# Patient Record
Sex: Female | Born: 1965 | Race: Black or African American | Hispanic: No | Marital: Married | Smoking: Current every day smoker
Health system: Southern US, Community
[De-identification: ages and names within clinical notes are randomized; demographics above are authoritative.]

## PROBLEM LIST (undated history)

## (undated) DIAGNOSIS — I7100 Dissection of unspecified site of aorta: Secondary | ICD-10-CM

## (undated) DIAGNOSIS — I10 Essential (primary) hypertension: Secondary | ICD-10-CM

## (undated) DIAGNOSIS — E7800 Pure hypercholesterolemia, unspecified: Secondary | ICD-10-CM

## (undated) DIAGNOSIS — G5600 Carpal tunnel syndrome, unspecified upper limb: Secondary | ICD-10-CM

## (undated) HISTORY — PX: CARPAL TUNNEL RELEASE: SHX101

## (undated) HISTORY — PX: CHOLECYSTECTOMY: SHX55

---

## 1997-10-12 ENCOUNTER — Emergency Department (HOSPITAL_COMMUNITY): Admission: EM | Admit: 1997-10-12 | Discharge: 1997-10-12 | Payer: Self-pay | Admitting: Emergency Medicine

## 1998-01-04 ENCOUNTER — Emergency Department (HOSPITAL_COMMUNITY): Admission: EM | Admit: 1998-01-04 | Discharge: 1998-01-04 | Payer: Self-pay | Admitting: Family Medicine

## 1998-02-28 ENCOUNTER — Emergency Department (HOSPITAL_COMMUNITY): Admission: EM | Admit: 1998-02-28 | Discharge: 1998-02-28 | Payer: Self-pay | Admitting: Emergency Medicine

## 1998-04-08 ENCOUNTER — Observation Stay (HOSPITAL_COMMUNITY): Admission: EM | Admit: 1998-04-08 | Discharge: 1998-04-09 | Payer: Self-pay | Admitting: Emergency Medicine

## 1998-04-08 ENCOUNTER — Encounter: Payer: Self-pay | Admitting: Emergency Medicine

## 1998-07-10 ENCOUNTER — Emergency Department (HOSPITAL_COMMUNITY): Admission: EM | Admit: 1998-07-10 | Discharge: 1998-07-10 | Payer: Self-pay | Admitting: Emergency Medicine

## 1998-08-15 ENCOUNTER — Emergency Department (HOSPITAL_COMMUNITY): Admission: EM | Admit: 1998-08-15 | Discharge: 1998-08-15 | Payer: Self-pay | Admitting: Emergency Medicine

## 1998-08-20 ENCOUNTER — Emergency Department (HOSPITAL_COMMUNITY): Admission: EM | Admit: 1998-08-20 | Discharge: 1998-08-20 | Payer: Self-pay | Admitting: Emergency Medicine

## 1998-09-17 ENCOUNTER — Emergency Department (HOSPITAL_COMMUNITY): Admission: EM | Admit: 1998-09-17 | Discharge: 1998-09-17 | Payer: Self-pay | Admitting: Emergency Medicine

## 1998-11-03 ENCOUNTER — Encounter: Admission: RE | Admit: 1998-11-03 | Discharge: 1998-11-03 | Payer: Self-pay | Admitting: Internal Medicine

## 1998-11-11 ENCOUNTER — Encounter: Admission: RE | Admit: 1998-11-11 | Discharge: 1998-11-11 | Payer: Self-pay | Admitting: Obstetrics & Gynecology

## 1999-01-28 ENCOUNTER — Ambulatory Visit (HOSPITAL_COMMUNITY): Admission: RE | Admit: 1999-01-28 | Discharge: 1999-01-28 | Payer: Self-pay | Admitting: Obstetrics & Gynecology

## 1999-04-08 ENCOUNTER — Emergency Department (HOSPITAL_COMMUNITY): Admission: EM | Admit: 1999-04-08 | Discharge: 1999-04-08 | Payer: Self-pay | Admitting: *Deleted

## 1999-11-26 ENCOUNTER — Other Ambulatory Visit: Admission: RE | Admit: 1999-11-26 | Discharge: 1999-11-26 | Payer: Self-pay | Admitting: Obstetrics

## 2000-02-25 ENCOUNTER — Inpatient Hospital Stay (HOSPITAL_COMMUNITY): Admission: AD | Admit: 2000-02-25 | Discharge: 2000-02-25 | Payer: Self-pay | Admitting: *Deleted

## 2000-04-05 ENCOUNTER — Inpatient Hospital Stay (HOSPITAL_COMMUNITY): Admission: AD | Admit: 2000-04-05 | Discharge: 2000-04-05 | Payer: Self-pay | Admitting: Obstetrics

## 2000-04-13 ENCOUNTER — Inpatient Hospital Stay (HOSPITAL_COMMUNITY): Admission: AD | Admit: 2000-04-13 | Discharge: 2000-04-16 | Payer: Self-pay | Admitting: Obstetrics

## 2000-04-13 ENCOUNTER — Encounter (INDEPENDENT_AMBULATORY_CARE_PROVIDER_SITE_OTHER): Payer: Self-pay

## 2001-02-22 ENCOUNTER — Emergency Department (HOSPITAL_COMMUNITY): Admission: EM | Admit: 2001-02-22 | Discharge: 2001-02-22 | Payer: Self-pay | Admitting: *Deleted

## 2001-03-03 ENCOUNTER — Inpatient Hospital Stay (HOSPITAL_COMMUNITY): Admission: AD | Admit: 2001-03-03 | Discharge: 2001-03-03 | Payer: Self-pay | Admitting: *Deleted

## 2001-03-04 ENCOUNTER — Emergency Department (HOSPITAL_COMMUNITY): Admission: EM | Admit: 2001-03-04 | Discharge: 2001-03-04 | Payer: Self-pay | Admitting: *Deleted

## 2001-04-30 ENCOUNTER — Emergency Department (HOSPITAL_COMMUNITY): Admission: EM | Admit: 2001-04-30 | Discharge: 2001-04-30 | Payer: Self-pay | Admitting: Emergency Medicine

## 2001-06-27 ENCOUNTER — Encounter: Payer: Self-pay | Admitting: Emergency Medicine

## 2001-06-27 ENCOUNTER — Emergency Department (HOSPITAL_COMMUNITY): Admission: EM | Admit: 2001-06-27 | Discharge: 2001-06-27 | Payer: Self-pay | Admitting: Emergency Medicine

## 2001-07-18 ENCOUNTER — Emergency Department (HOSPITAL_COMMUNITY): Admission: EM | Admit: 2001-07-18 | Discharge: 2001-07-18 | Payer: Self-pay | Admitting: Emergency Medicine

## 2001-12-15 ENCOUNTER — Emergency Department (HOSPITAL_COMMUNITY): Admission: EM | Admit: 2001-12-15 | Discharge: 2001-12-15 | Payer: Self-pay | Admitting: Emergency Medicine

## 2002-01-03 ENCOUNTER — Emergency Department (HOSPITAL_COMMUNITY): Admission: EM | Admit: 2002-01-03 | Discharge: 2002-01-03 | Payer: Self-pay | Admitting: Emergency Medicine

## 2002-01-09 ENCOUNTER — Emergency Department (HOSPITAL_COMMUNITY): Admission: EM | Admit: 2002-01-09 | Discharge: 2002-01-09 | Payer: Self-pay | Admitting: Emergency Medicine

## 2002-01-12 ENCOUNTER — Emergency Department (HOSPITAL_COMMUNITY): Admission: EM | Admit: 2002-01-12 | Discharge: 2002-01-12 | Payer: Self-pay | Admitting: Emergency Medicine

## 2002-05-07 ENCOUNTER — Inpatient Hospital Stay (HOSPITAL_COMMUNITY): Admission: AD | Admit: 2002-05-07 | Discharge: 2002-05-07 | Payer: Self-pay | Admitting: *Deleted

## 2002-05-09 ENCOUNTER — Inpatient Hospital Stay (HOSPITAL_COMMUNITY): Admission: AD | Admit: 2002-05-09 | Discharge: 2002-05-09 | Payer: Self-pay | Admitting: Obstetrics and Gynecology

## 2002-07-15 ENCOUNTER — Emergency Department (HOSPITAL_COMMUNITY): Admission: EM | Admit: 2002-07-15 | Discharge: 2002-07-15 | Payer: Self-pay | Admitting: Emergency Medicine

## 2002-07-30 ENCOUNTER — Emergency Department (HOSPITAL_COMMUNITY): Admission: EM | Admit: 2002-07-30 | Discharge: 2002-07-30 | Payer: Self-pay | Admitting: Emergency Medicine

## 2002-09-18 ENCOUNTER — Emergency Department (HOSPITAL_COMMUNITY): Admission: EM | Admit: 2002-09-18 | Discharge: 2002-09-18 | Payer: Self-pay | Admitting: Emergency Medicine

## 2002-10-04 ENCOUNTER — Emergency Department (HOSPITAL_COMMUNITY): Admission: EM | Admit: 2002-10-04 | Discharge: 2002-10-04 | Payer: Self-pay | Admitting: Emergency Medicine

## 2003-01-25 ENCOUNTER — Emergency Department (HOSPITAL_COMMUNITY): Admission: EM | Admit: 2003-01-25 | Discharge: 2003-01-25 | Payer: Self-pay | Admitting: Emergency Medicine

## 2003-03-20 ENCOUNTER — Emergency Department (HOSPITAL_COMMUNITY): Admission: EM | Admit: 2003-03-20 | Discharge: 2003-03-20 | Payer: Self-pay | Admitting: Emergency Medicine

## 2003-05-03 ENCOUNTER — Emergency Department (HOSPITAL_COMMUNITY): Admission: EM | Admit: 2003-05-03 | Discharge: 2003-05-03 | Payer: Self-pay | Admitting: Emergency Medicine

## 2003-05-22 ENCOUNTER — Ambulatory Visit (HOSPITAL_COMMUNITY): Admission: RE | Admit: 2003-05-22 | Discharge: 2003-05-22 | Payer: Self-pay | Admitting: *Deleted

## 2003-07-28 ENCOUNTER — Emergency Department (HOSPITAL_COMMUNITY): Admission: EM | Admit: 2003-07-28 | Discharge: 2003-07-28 | Payer: Self-pay | Admitting: Emergency Medicine

## 2003-08-19 ENCOUNTER — Inpatient Hospital Stay (HOSPITAL_COMMUNITY): Admission: AD | Admit: 2003-08-19 | Discharge: 2003-08-19 | Payer: Self-pay | Admitting: *Deleted

## 2003-09-22 ENCOUNTER — Inpatient Hospital Stay (HOSPITAL_COMMUNITY): Admission: AD | Admit: 2003-09-22 | Discharge: 2003-09-25 | Payer: Self-pay | Admitting: Family Medicine

## 2003-09-22 ENCOUNTER — Encounter (INDEPENDENT_AMBULATORY_CARE_PROVIDER_SITE_OTHER): Payer: Self-pay | Admitting: Specialist

## 2003-09-30 ENCOUNTER — Inpatient Hospital Stay (HOSPITAL_COMMUNITY): Admission: AD | Admit: 2003-09-30 | Discharge: 2003-09-30 | Payer: Self-pay | Admitting: Obstetrics and Gynecology

## 2004-03-14 ENCOUNTER — Emergency Department (HOSPITAL_COMMUNITY): Admission: EM | Admit: 2004-03-14 | Discharge: 2004-03-14 | Payer: Self-pay | Admitting: Emergency Medicine

## 2004-03-27 ENCOUNTER — Ambulatory Visit (HOSPITAL_COMMUNITY): Payer: Self-pay | Admitting: Psychiatry

## 2004-04-16 ENCOUNTER — Emergency Department (HOSPITAL_COMMUNITY): Admission: EM | Admit: 2004-04-16 | Discharge: 2004-04-16 | Payer: Self-pay | Admitting: Emergency Medicine

## 2004-05-01 ENCOUNTER — Emergency Department (HOSPITAL_COMMUNITY): Admission: EM | Admit: 2004-05-01 | Discharge: 2004-05-01 | Payer: Self-pay | Admitting: Emergency Medicine

## 2004-05-29 ENCOUNTER — Emergency Department (HOSPITAL_COMMUNITY): Admission: EM | Admit: 2004-05-29 | Discharge: 2004-05-29 | Payer: Self-pay | Admitting: Emergency Medicine

## 2004-06-22 ENCOUNTER — Emergency Department (HOSPITAL_COMMUNITY): Admission: EM | Admit: 2004-06-22 | Discharge: 2004-06-22 | Payer: Self-pay | Admitting: Emergency Medicine

## 2004-07-02 ENCOUNTER — Emergency Department (HOSPITAL_COMMUNITY): Admission: EM | Admit: 2004-07-02 | Discharge: 2004-07-02 | Payer: Self-pay | Admitting: Emergency Medicine

## 2004-08-17 ENCOUNTER — Emergency Department (HOSPITAL_COMMUNITY): Admission: EM | Admit: 2004-08-17 | Discharge: 2004-08-17 | Payer: Self-pay | Admitting: Emergency Medicine

## 2006-01-26 ENCOUNTER — Emergency Department (HOSPITAL_COMMUNITY): Admission: EM | Admit: 2006-01-26 | Discharge: 2006-01-26 | Payer: Self-pay | Admitting: Emergency Medicine

## 2006-04-07 ENCOUNTER — Emergency Department (HOSPITAL_COMMUNITY): Admission: EM | Admit: 2006-04-07 | Discharge: 2006-04-07 | Payer: Self-pay | Admitting: Emergency Medicine

## 2006-04-11 ENCOUNTER — Emergency Department (HOSPITAL_COMMUNITY): Admission: EM | Admit: 2006-04-11 | Discharge: 2006-04-11 | Payer: Self-pay | Admitting: Emergency Medicine

## 2006-06-16 ENCOUNTER — Emergency Department (HOSPITAL_COMMUNITY): Admission: EM | Admit: 2006-06-16 | Discharge: 2006-06-16 | Payer: Self-pay | Admitting: Internal Medicine

## 2006-08-08 ENCOUNTER — Emergency Department (HOSPITAL_COMMUNITY): Admission: EM | Admit: 2006-08-08 | Discharge: 2006-08-08 | Payer: Self-pay | Admitting: Emergency Medicine

## 2006-11-30 ENCOUNTER — Encounter: Admission: RE | Admit: 2006-11-30 | Discharge: 2006-11-30 | Payer: Self-pay | Admitting: Orthopedic Surgery

## 2007-04-28 ENCOUNTER — Emergency Department (HOSPITAL_COMMUNITY): Admission: EM | Admit: 2007-04-28 | Discharge: 2007-04-28 | Payer: Self-pay | Admitting: Emergency Medicine

## 2007-06-02 ENCOUNTER — Emergency Department (HOSPITAL_COMMUNITY): Admission: EM | Admit: 2007-06-02 | Discharge: 2007-06-02 | Payer: Self-pay | Admitting: Emergency Medicine

## 2007-06-12 ENCOUNTER — Emergency Department (HOSPITAL_COMMUNITY): Admission: EM | Admit: 2007-06-12 | Discharge: 2007-06-12 | Payer: Self-pay | Admitting: Emergency Medicine

## 2007-07-05 ENCOUNTER — Emergency Department (HOSPITAL_COMMUNITY): Admission: EM | Admit: 2007-07-05 | Discharge: 2007-07-05 | Payer: Self-pay | Admitting: Emergency Medicine

## 2007-07-10 ENCOUNTER — Emergency Department (HOSPITAL_COMMUNITY): Admission: EM | Admit: 2007-07-10 | Discharge: 2007-07-10 | Payer: Self-pay | Admitting: Emergency Medicine

## 2007-07-12 ENCOUNTER — Emergency Department (HOSPITAL_COMMUNITY): Admission: EM | Admit: 2007-07-12 | Discharge: 2007-07-12 | Payer: Self-pay | Admitting: Emergency Medicine

## 2007-07-20 ENCOUNTER — Emergency Department (HOSPITAL_COMMUNITY): Admission: EM | Admit: 2007-07-20 | Discharge: 2007-07-20 | Payer: Self-pay | Admitting: Emergency Medicine

## 2007-07-25 ENCOUNTER — Emergency Department (HOSPITAL_COMMUNITY): Admission: EM | Admit: 2007-07-25 | Discharge: 2007-07-25 | Payer: Self-pay | Admitting: Emergency Medicine

## 2007-08-04 ENCOUNTER — Emergency Department (HOSPITAL_COMMUNITY): Admission: EM | Admit: 2007-08-04 | Discharge: 2007-08-04 | Payer: Self-pay | Admitting: Emergency Medicine

## 2007-09-07 ENCOUNTER — Emergency Department (HOSPITAL_COMMUNITY): Admission: EM | Admit: 2007-09-07 | Discharge: 2007-09-07 | Payer: Self-pay | Admitting: Family Medicine

## 2007-09-21 ENCOUNTER — Emergency Department (HOSPITAL_COMMUNITY): Admission: EM | Admit: 2007-09-21 | Discharge: 2007-09-21 | Payer: Self-pay | Admitting: Emergency Medicine

## 2007-10-01 ENCOUNTER — Emergency Department: Payer: Self-pay | Admitting: Emergency Medicine

## 2007-11-04 ENCOUNTER — Emergency Department (HOSPITAL_COMMUNITY): Admission: EM | Admit: 2007-11-04 | Discharge: 2007-11-04 | Payer: Self-pay | Admitting: Emergency Medicine

## 2007-11-27 ENCOUNTER — Emergency Department (HOSPITAL_COMMUNITY): Admission: EM | Admit: 2007-11-27 | Discharge: 2007-11-27 | Payer: Self-pay | Admitting: Emergency Medicine

## 2007-12-03 ENCOUNTER — Emergency Department: Payer: Self-pay | Admitting: Emergency Medicine

## 2008-04-01 ENCOUNTER — Emergency Department (HOSPITAL_COMMUNITY): Admission: EM | Admit: 2008-04-01 | Discharge: 2008-04-01 | Payer: Self-pay | Admitting: Emergency Medicine

## 2008-04-20 ENCOUNTER — Emergency Department (HOSPITAL_COMMUNITY): Admission: EM | Admit: 2008-04-20 | Discharge: 2008-04-20 | Payer: Self-pay | Admitting: Emergency Medicine

## 2008-06-07 ENCOUNTER — Encounter: Admission: RE | Admit: 2008-06-07 | Discharge: 2008-06-07 | Payer: Self-pay | Admitting: Obstetrics and Gynecology

## 2008-11-02 ENCOUNTER — Emergency Department (HOSPITAL_COMMUNITY): Admission: EM | Admit: 2008-11-02 | Discharge: 2008-11-02 | Payer: Self-pay | Admitting: Emergency Medicine

## 2009-01-20 ENCOUNTER — Emergency Department (HOSPITAL_COMMUNITY): Admission: EM | Admit: 2009-01-20 | Discharge: 2009-01-20 | Payer: Self-pay | Admitting: Emergency Medicine

## 2010-05-16 ENCOUNTER — Encounter: Payer: Self-pay | Admitting: Obstetrics

## 2010-05-16 ENCOUNTER — Encounter: Payer: Self-pay | Admitting: Cardiology

## 2010-05-17 ENCOUNTER — Encounter: Payer: Self-pay | Admitting: Sports Medicine

## 2010-05-17 ENCOUNTER — Encounter: Payer: Self-pay | Admitting: Orthopedic Surgery

## 2010-05-18 ENCOUNTER — Encounter: Payer: Self-pay | Admitting: Family Medicine

## 2010-09-11 NOTE — Op Note (Signed)
[name], [name]                          ACCOUNT NO.:  [id number]   MEDICAL RECORD NO.:  [id number]                   PATIENT TYPE:  INP   LOCATION:  9119                                 FACILITY:  [facility]   PHYSICIAN:  Tanya S. Shawnie Pons, M.D.                DATE OF BIRTH:  November 16, 1965   DATE OF PROCEDURE:  09/22/2003  DATE OF DISCHARGE:                                 OPERATIVE REPORT   PREOPERATIVE DIAGNOSES:  1. Intrauterine pregnancy at 36-4/7 weeks, spontaneous rupture of membranes,     meconium-stained fluid.  2. Previous cesarean section x2.  3. Undesired fertility.   POSTOPERATIVE DIAGNOSES:  1. Intrauterine pregnancy at 36-4/7 weeks, spontaneous rupture of membranes,     meconium-stained fluid.  2. Previous cesarean section x2.  3. Undesired fertility.   PROCEDURE:  Repeat low transverse cesarean section and bilateral tubal  ligation with Filshie clips.   SURGEON:  Shelbie Proctor. Shawnie Pons, M.D.   ASSISTANT:  Jonah Blue, M.D.   ANESTHESIA:  Spinal.   ANESTHESIOLOGIST:  Belva Agee, M.D.   FINDINGS:  Meconium-stained fluid; viable female infant, Apgars 9/9, cord pH  7.33.   ESTIMATED BLOOD LOSS:  1000 mL.   COMPLICATIONS:  None.   SPECIMENS:  Placenta sent to pathology.   REASON FOR PROCEDURE:  The patient was a 45 year old gravida 5, para 2-0-2-  2, who has had a history of C-section x2, 1 for previa and 1 for placental  abruption, as well as an exploratory laparotomy for an ectopic pregnancy.  She was scheduled for a C-section later in June but ruptured membranes and  began contracting tonight.  She noted that the fluid was green-tinged in  appearance.  On presentation to the MAU, she was found to be ruptured, to 1-  2 cm, 60% and -3.  She was contracting every 2-3 and very uncomfortable.  The patient also desired permanent sterility.  She was counseled regarding  risks and benefits of this procedure, as well as the repeat C-section, and  plan was made to  proceed to the OR.   PROCEDURE:  The patient was taken to the OR, where spinal anesthesia was  placed.  When anesthesia was adequate, she was prepped and draped in the  usual sterile fashion.  A Pfannenstiel incision was made through an old  incision and carried down to the underlying fascia.  The fascia was divided  sharply in the midline and carried out laterally.  The fascia was then  dissected off the superior and inferior edges of the rectus using the  electrocautery.  The peritoneal cavity was entered on both sides and the 2  incisions connected.  The peritoneal incision was extended by the surgeon  __________ on either side of the incision.  The bladder blade was then  placed inside the abdomen and Metzenbaums and a pickup were used to create a  bladder flap and a knife  was used to make an incision in the lower uterine  segment.  The lower uterine segment was found to be somewhat thick and 2  Allis clamps were used in the midline to elevate the uterus until the  amniotic cavity was entered.  Again, meconium-stained fluid was noted.  The  infant was in a vertex presentation.  The uterine incision was extended  laterally with bandage scissors and the infant delivered easily.  The infant  was DeLee-suctioned on the abdomen, cord clamped x2, cut and taken to  awaiting pediatricians.  The placenta was removed manually and the uterine  cavity cleaned with dry lap pads.  The edges of the uterine incision were  then grasped with ring forceps and the uterine incision closed with an  interlocking 0 Vicryl suture.  Pressure was placed on the incision by a dry  lap pad while the left tube was found, grasped with a Babcock and a Filshie  clip placed across it.  Similarly, the right tube was found, followed to its  fimbriated end and a Filshie clip placed across it.  The tubes were returned  to the abdominal cavity; the uterine incision appeared to be hemostatic.  All laps were removed from the  abdomen and instruments as well.  The fascia  was closed with a 0 interrupted suture in a running fashion.  Subcutaneous  tissue was copiously irrigated and any bleeders cauterized and skin closed  using clips.  The skin was then infiltrated with 10 mL of 0.25% Marcaine.  All instrument, needle and lap counts were correct x2.  The patient was  awake, alert and taken to recovery room in stable condition.                                               Shelbie Proctor. Shawnie Pons, M.D.    TSP/MEDQ  D:  09/22/2003  T:  09/23/2003  Job:  [id number]

## 2010-09-11 NOTE — H&P (Signed)
[facility]  Patient:    [name], [name]                       MRN: [id number] Adm. Date:  [id number] Attending:  Venita Sheffield                         History and Physical  HISTORY OF PRESENT ILLNESS:   The patient is a 45 year old gravida 3 para 1-0-1-1 who had a previous C section.  EDC was May 06, 2000.  Patient states that her membranes ruptured one-half hour prior to admission and she started contracting and having heavy vaginal bleeding.  On the monitor she had a uterus which was moderately firm and there was some irritability.  She had positive GBS and there was a large amount of blood on her legs, floor, and in the vagina.  PHYSICAL EXAMINATION:  HEENT:                        Negative.  LUNGS:                        Clear.  HEART:                        Regular rhythm, no murmurs or gallops.  ABDOMEN:                      Term-sized uterus.  BREASTS:                      Negative.  PELVIC:                       Cervix is 2 cm, posterior, and the presenting part is not palpated.  EXTREMITIES:                  Negative.  IMPRESSION:                   Abruption of the placenta.  PLAN:                         Repeat low transverse cesarean section. DD:  04/13/00 TD:  04/13/00 Job: [id number]

## 2010-09-11 NOTE — Discharge Summary (Signed)
[name], [name]                          ACCOUNT NO.:  [id number]   MEDICAL RECORD NO.:  [id number]                   PATIENT TYPE:  INP   LOCATION:  9119                                 FACILITY:  [facility]   PHYSICIAN:  Tanya S. Shawnie Pons, M.D.                DATE OF BIRTH:  15-Nov-1965   DATE OF ADMISSION:  09/22/2003  DATE OF DISCHARGE:  09/25/2003                                 DISCHARGE SUMMARY   HOSPITAL COURSE:  This is a 45 year old G5 P3-0-2-3 who presented to the MAU  on Sep 22, 2003 who was scheduled for a repeat low transverse cesarean  section after spontaneous rupture of membranes.  She was also noted to have  moderate meconium after rupture.  She also requested a bilateral tubal  ligation after the cesarean section.  The patient was taken for cesarean  section on Sep 22, 2003.  She delivered a viable female, Apgars 9 at one and  9 at five minutes, no complications.  Postpartum course was uneventful.  She  was afebrile.  Vital signs were stable upon discharge.   ACTIVITY:  The patient is to have no heavy lifting for 2 weeks.   DIET:  Routine.   MEDICATIONS:  1. Prenatal vitamins one p.o. daily.  2. Ibuprofen 600 mg p.o. q.6h. p.r.n. pain.  3. Percocet 5/325 mg one p.o. q.4h. p.r.n. pain.   STATUS:  Well.   INSTRUCTIONS:  1. Routine.  2. Discharge to home.  3. Follow up in [facility] in 6 weeks.     Miles Costain, M.D.                          Shelbie Proctor. Shawnie Pons, M.D.    DY/MEDQ  D:  10/14/2003  T:  10/14/2003  Job:  [id number]

## 2010-09-11 NOTE — Discharge Summary (Signed)
[facility]  Patient:    NARCISSUS, DETWILER                       MRN: [id number] Adm. Date:  [id number] Disc. Date: [id number] Attending:  Venita Sheffield                           Discharge Summary  HISTORY:                      Patient is a 45 year old, gravida 3, para 1-0-1-1, [facility] May 06, 2000. She had a positive GBS and stated that her membranes ruptured half an hour prior to admission and then she started having heavy vaginal bleeding. She had a previous C-section. On admission, it was noted that her uterus was irritable, firm, cervix 1 cm, long, vertex, -3 with a large amount of blood in the vagina. Under the diagnosis of possible abruption, she was taken to the operating room, had a repeat low transverse cesarean section. She had anterior low-lying placenta with abruption. She delivered a 5 pound 3 ounce female, Apgar 9/9; cord pH 7.31.  Postoperatively she did well. She was discharged home on the third postoperative day, ambulatory, on a regular diet, and on Vicodin for pain. She is to see me in six weeks.  DISCHARGE DIAGNOSIS:          Status post repeat low transverse cesarean                               section for abruption of placenta.DD:  05/12/00 TD:  05/13/00 Job: [id number]

## 2010-09-11 NOTE — Op Note (Signed)
[facility]  Patient:    [name], [name]                       MRN: [id number] Proc. Date: 04/13/00 Adm. Date:  [id number] Attending:  Venita Sheffield                           Operative Report  PREOPERATIVE DIAGNOSES:       1. Intrauterine pregnancy at 37 weeks.                               2. Suspected abruption of placenta.  POSTOPERATIVE DIAGNOSES:      1. Intrauterine pregnancy at 37 weeks.                               2. Suspected abruption of placenta.  SURGEON:                      Kathreen Cosier, M.D.  ANESTHESIA:                   Spinal.  DESCRIPTION OF PROCEDURE:     The patient was placed on the operating room table in the supine position and after spinal anesthesia was administered the abdomen was prepped and draped.  The bladder was emptied with a Foley catheter.  A transverse suprapubic incision was made through the old scar and carried down to the rectus fascia.  The fascia was cleaned and incised the length of the incision and the rectus muscles were retracted laterally.  The peritoneum was incised longitudinally.  A transverse incision was made in the visceral peritoneum above the bladder and the bladder mobilized inferiorly.  A transverse lower uterine incision was made and it was noted she had an anterior low-lying placenta, with some abruption.  The amniotic fluid was meconium stained.  The infant was female and was assigned Apgar scores of 9 at one minute and 9 at five minutes, weighing 5 pounds 3 ounces.  The placenta was removed manually and the uterine cavity cleaned with dry laps.  The uterine incision was closed with interlocking suture of #1 chromic including the myometrium and endometrium.  The uterus was closed in two layers. Hemostasis was satisfactory.  The bladder flap was reattached with 2-0 chromic.  Sponge, needle, and instrument counts were correct.  The uterus was well contracted.  The abdomen  was closed in layers.  The peritoneum was closed with continuous suture of 0 chromic, the fascia with continuous suture of 0 Dexon, and the skin closed with subcuticular stitch of 3-0 Monocryl. Estimated blood loss was 600 cc. DD:  04/13/00 TD:  04/14/00 Job: [id number]

## 2011-01-14 LAB — DIFFERENTIAL
Basophils Absolute: 0
Lymphocytes Relative: 45
Lymphs Abs: 2.1
Monocytes Absolute: 0.2
Monocytes Relative: 5
Neutrophils Relative %: 48

## 2011-01-14 LAB — GC/CHLAMYDIA PROBE AMP, GENITAL
Chlamydia, DNA Probe: NEGATIVE
GC Probe Amp, Genital: NEGATIVE

## 2011-01-14 LAB — CBC
HCT: 32.4 — ABNORMAL LOW
Hemoglobin: 10.9 — ABNORMAL LOW
MCHC: 33.5
Platelets: 299
RDW: 13.1
WBC: 4.6

## 2011-01-14 LAB — BASIC METABOLIC PANEL
BUN: 4 — ABNORMAL LOW
CO2: 28
Calcium: 9.1
Chloride: 105
GFR calc non Af Amer: >60
Glucose, Bld: 93
Potassium: 3.9

## 2011-01-14 LAB — URINALYSIS, ROUTINE W REFLEX MICROSCOPIC

## 2011-09-22 ENCOUNTER — Other Ambulatory Visit: Payer: Self-pay | Admitting: Obstetrics and Gynecology

## 2011-09-22 DIAGNOSIS — Z1231 Encounter for screening mammogram for malignant neoplasm of breast: Secondary | ICD-10-CM

## 2011-10-11 ENCOUNTER — Ambulatory Visit
Admission: RE | Admit: 2011-10-11 | Discharge: 2011-10-11 | Disposition: A | Discharge status: Home / Self Care | Payer: Medicaid Other | Source: Ambulatory Visit | Attending: Obstetrics and Gynecology | Admitting: Obstetrics and Gynecology

## 2011-10-11 DIAGNOSIS — Z1231 Encounter for screening mammogram for malignant neoplasm of breast: Secondary | ICD-10-CM

## 2012-05-19 ENCOUNTER — Encounter (HOSPITAL_COMMUNITY): Payer: Self-pay | Admitting: Emergency Medicine

## 2012-05-19 ENCOUNTER — Emergency Department (HOSPITAL_COMMUNITY)
Admission: EM | Admit: 2012-05-19 | Discharge: 2012-05-19 | Disposition: A | Discharge status: Home / Self Care | Payer: Medicaid Other | Attending: Emergency Medicine | Admitting: Emergency Medicine

## 2012-05-19 DIAGNOSIS — K0889 Other specified disorders of teeth and supporting structures: Secondary | ICD-10-CM

## 2012-05-19 DIAGNOSIS — K089 Disorder of teeth and supporting structures, unspecified: Secondary | ICD-10-CM | POA: Insufficient documentation

## 2012-05-19 DIAGNOSIS — F17200 Nicotine dependence, unspecified, uncomplicated: Secondary | ICD-10-CM | POA: Insufficient documentation

## 2012-05-19 MED ORDER — OXYCODONE-ACETAMINOPHEN 5-325 MG PO TABS: 1.0000 | ORAL_TABLET | Freq: Four times a day (QID) | ORAL | Status: DC | PRN

## 2012-05-19 NOTE — ED Provider Notes (Signed)
History     CSN: [id number]  Arrival date & time 05/19/12  6295   First MD Initiated Contact with Patient 05/19/12 [phone]      Chief Complaint  Patient presents with  . Dental Pain    (Consider location/radiation/quality/duration/timing/severity/associated sxs/prior treatment) HPI Comments:  47 year old female presents emergency department complaining of right-sided dental pain beginning 2 days ago while flossing her teeth. Patient states she was flossing one half of her bottom molar "fluid across the room". Since then she has had increasing pain described as throbbing, nonradiating rated 8/10. Cold air and chewing makes the pain worse. Nothing in specific makes the pain better. Denies fever, chills or difficulty swallowing. She tried calling her dentist yesterday and states he would not see her do to her owing money.  Patient is a 47 y.o. female presenting with tooth pain. The history is provided by the patient.  Dental PainPrimary symptoms do not include fever.  Additional symptoms do not include: trouble swallowing.    History reviewed. No pertinent past medical history.  Past Surgical History  Procedure Date  . Cesarean section   . Cholecystectomy     History reviewed. No pertinent family history.  History  Substance Use Topics  . Smoking status: Current Every Day Smoker  . Smokeless tobacco: Not on file  . Alcohol Use: Yes    OB History    Grav Para Term Preterm Abortions TAB SAB Ect Mult Living                  Review of Systems  Constitutional: Negative for fever and chills.  HENT: Positive for dental problem. Negative for trouble swallowing, neck pain and neck stiffness.   Gastrointestinal: Negative for nausea.  Skin: Negative.     Allergies  Sulfa antibiotics  Home Medications   Current Outpatient Rx  Name  Route  Sig  Dispense  Refill  . ACETAMINOPHEN 500 MG PO TABS   Oral   Take 1,000 mg by mouth every 6 (six) hours as needed. For pain.         . ADULT MULTIVITAMIN W/MINERALS CH   Oral   Take 1 tablet by mouth daily.         . OXYCODONE-ACETAMINOPHEN 5-325 MG PO TABS   Oral   Take 1-2 tablets by mouth every 6 (six) hours as needed for pain.   10 tablet   0     BP 138/90  Pulse 106  Temp 97.9 F (36.6 C) (Oral)  Resp 20  SpO2 99%  LMP 04/30/2012  Physical Exam  Nursing note and vitals reviewed. Constitutional: She is oriented to person, place, and time. She appears well-developed and well-nourished. No distress.  HENT:  Head: Normocephalic and atraumatic.  Mouth/Throat: Abnormal dentition.    Eyes: Conjunctivae normal and EOM are normal.  Neck: Normal range of motion. Neck supple.  Cardiovascular: Normal rate, regular rhythm and normal heart sounds.   Pulmonary/Chest: Effort normal and breath sounds normal.  Musculoskeletal: Normal range of motion. She exhibits no edema.  Lymphadenopathy:       Head (right side): No submandibular adenopathy present.       Head (left side): No submandibular adenopathy present.  Neurological: She is alert and oriented to person, place, and time.  Skin: Skin is warm and dry.  Psychiatric: She has a normal mood and affect. Her behavior is normal.    ED Course  Procedures (including critical care time)  Labs Reviewed - No data to display  No results found.   1. Pain, dental       MDM  Dental pain without dental infection. Patient refused dental block. Discussed importance of f/u with dentist. Rx percocet #10. Patient states understanding of plan and is agreeable.         Trevor Mace, PA-C 05/19/12 [phone]

## 2012-05-19 NOTE — ED Provider Notes (Signed)
Medical screening examination/treatment/procedure(s) were performed by non-physician practitioner and as supervising physician I was immediately available for consultation/collaboration.  Donnetta Hutching, MD 05/19/12 1027

## 2012-05-19 NOTE — ED Notes (Signed)
Pt states she was flossing her teeth 2 days ago and part of the last molar on the right lower side broke off.  Pt states she has had pain since and doesn't have the money to go to the dentist.

## 2012-07-07 ENCOUNTER — Emergency Department (HOSPITAL_COMMUNITY)
Admission: EM | Admit: 2012-07-07 | Discharge: 2012-07-07 | Disposition: A | Discharge status: Home / Self Care | Payer: Self-pay | Attending: Emergency Medicine | Admitting: Emergency Medicine

## 2012-07-07 ENCOUNTER — Encounter (HOSPITAL_COMMUNITY): Payer: Self-pay | Admitting: *Deleted

## 2012-07-07 DIAGNOSIS — K1370 Unspecified lesions of oral mucosa: Secondary | ICD-10-CM | POA: Insufficient documentation

## 2012-07-07 DIAGNOSIS — F17200 Nicotine dependence, unspecified, uncomplicated: Secondary | ICD-10-CM | POA: Insufficient documentation

## 2012-07-07 DIAGNOSIS — K062 Gingival and edentulous alveolar ridge lesions associated with trauma: Secondary | ICD-10-CM

## 2012-07-07 MED ORDER — HYDROCODONE-ACETAMINOPHEN 5-325 MG PO TABS: 1.0000 | ORAL_TABLET | Freq: Four times a day (QID) | ORAL | Status: DC | PRN

## 2012-07-07 MED ORDER — PENICILLIN V POTASSIUM 500 MG PO TABS: 500.0000 mg | ORAL_TABLET | Freq: Three times a day (TID) | ORAL | Status: DC

## 2012-07-07 MED ORDER — FLUCONAZOLE 200 MG PO TABS: ORAL_TABLET | ORAL | Status: DC

## 2012-07-07 NOTE — ED Provider Notes (Signed)
History     CSN: [id number]  Arrival date & time 07/07/12  1845   First MD Initiated Contact with Patient 07/07/12 2128      Chief Complaint  Patient presents with  . Dental Pain    (Consider location/radiation/quality/duration/timing/severity/associated sxs/prior treatment) Patient is a 47 y.o. female presenting with tooth pain. The history is provided by the patient. No language interpreter was used.  Dental PainThe primary symptoms include mouth pain. The symptoms began more than 1 week ago. The symptoms are worsening. The symptoms occur constantly.    History reviewed. No pertinent past medical history.  Past Surgical History  Procedure Laterality Date  . Cesarean section    . Cholecystectomy      History reviewed. No pertinent family history.  History  Substance Use Topics  . Smoking status: Current Every Day Smoker  . Smokeless tobacco: Not on file  . Alcohol Use: Yes    OB History   Grav Para Term Preterm Abortions TAB SAB Ect Mult Living                  Review of Systems  HENT: Positive for mouth sores and dental problem.   All other systems reviewed and are negative.    Allergies  Sulfa antibiotics  Home Medications   Current Outpatient Rx  Name  Route  Sig  Dispense  Refill  . diphenhydramine-acetaminophen (TYLENOL PM) 25-500 MG TABS   Oral   Take 1 tablet by mouth at bedtime as needed (for sleep).         Marland Kitchen ibuprofen (ADVIL,MOTRIN) 200 MG tablet   Oral   Take 200 mg by mouth 4 (four) times daily as needed for pain.         . Multiple Vitamin (MULTIVITAMIN WITH MINERALS) TABS   Oral   Take 1 tablet by mouth daily.         . fluconazole (DIFLUCAN) 200 MG tablet      Take one with start of antibiotic.  Repeat in 72 hours if needed.   2 tablet   0   . HYDROcodone-acetaminophen (NORCO/VICODIN) 5-325 MG per tablet   Oral   Take 1 tablet by mouth every 6 (six) hours as needed for pain.   10 tablet   0   . penicillin v potassium  (VEETID) 500 MG tablet   Oral   Take 1 tablet (500 mg total) by mouth 3 (three) times daily.   30 tablet   0     BP 116/89  Pulse 103  Temp(Src) 98.3 F (36.8 C) (Oral)  Resp 16  SpO2 100%  Physical Exam  Nursing note and vitals reviewed. Constitutional: She is oriented to person, place, and time. She appears well-developed and well-nourished.  HENT:  Head: Normocephalic and atraumatic.  Eyes: Conjunctivae are normal. Pupils are equal, round, and reactive to light.  Neck: Normal range of motion. Neck supple.  Cardiovascular: Normal rate and regular rhythm.   Pulmonary/Chest: Effort normal and breath sounds normal.  Abdominal: Soft. Bowel sounds are normal.  Musculoskeletal: Normal range of motion.  Neurological: She is alert and oriented to person, place, and time.  Skin: Skin is warm and dry.  Psychiatric: She has a normal mood and affect. Her behavior is normal. Judgment and thought content normal.    ED Course  Procedures (including critical care time)  Labs Reviewed - No data to display No results found.   1. Denture irritation     Poorly fitting denture  causing irritation to gums, with small pustular lesion noted on left side of upper gum.  MDM         Jimmye Norman, NP 07/07/12 [phone]

## 2012-07-07 NOTE — ED Notes (Signed)
Pt reports that her dentures are causing her gums to be irritated and having pain. Airway intact, no distress noted.

## 2012-07-08 NOTE — ED Provider Notes (Signed)
Medical screening examination/treatment/procedure(s) were performed by non-physician practitioner and as supervising physician I was immediately available for consultation/collaboration.  Sam Jacubowitz, MD 07/08/12 0019 

## 2012-08-07 ENCOUNTER — Emergency Department (HOSPITAL_COMMUNITY): Payer: Medicaid Other

## 2012-08-07 ENCOUNTER — Emergency Department (HOSPITAL_COMMUNITY)
Admission: EM | Admit: 2012-08-07 | Discharge: 2012-08-07 | Disposition: A | Discharge status: Home / Self Care | Payer: Medicaid Other | Attending: Emergency Medicine | Admitting: Emergency Medicine

## 2012-08-07 ENCOUNTER — Encounter (HOSPITAL_COMMUNITY): Payer: Self-pay | Admitting: *Deleted

## 2012-08-07 DIAGNOSIS — S46909A Unspecified injury of unspecified muscle, fascia and tendon at shoulder and upper arm level, unspecified arm, initial encounter: Secondary | ICD-10-CM | POA: Insufficient documentation

## 2012-08-07 DIAGNOSIS — Y93E5 Activity, floor mopping and cleaning: Secondary | ICD-10-CM | POA: Insufficient documentation

## 2012-08-07 DIAGNOSIS — S199XXA Unspecified injury of neck, initial encounter: Secondary | ICD-10-CM | POA: Insufficient documentation

## 2012-08-07 DIAGNOSIS — M25512 Pain in left shoulder: Secondary | ICD-10-CM

## 2012-08-07 DIAGNOSIS — F17200 Nicotine dependence, unspecified, uncomplicated: Secondary | ICD-10-CM | POA: Insufficient documentation

## 2012-08-07 DIAGNOSIS — S4980XA Other specified injuries of shoulder and upper arm, unspecified arm, initial encounter: Secondary | ICD-10-CM | POA: Insufficient documentation

## 2012-08-07 DIAGNOSIS — X58XXXA Exposure to other specified factors, initial encounter: Secondary | ICD-10-CM | POA: Insufficient documentation

## 2012-08-07 DIAGNOSIS — Y92009 Unspecified place in unspecified non-institutional (private) residence as the place of occurrence of the external cause: Secondary | ICD-10-CM | POA: Insufficient documentation

## 2012-08-07 DIAGNOSIS — S0993XA Unspecified injury of face, initial encounter: Secondary | ICD-10-CM | POA: Insufficient documentation

## 2012-08-07 DIAGNOSIS — Z79899 Other long term (current) drug therapy: Secondary | ICD-10-CM | POA: Insufficient documentation

## 2012-08-07 MED ORDER — HYDROMORPHONE HCL PF 1 MG/ML IJ SOLN
1.0000 mg | Freq: Once | INTRAMUSCULAR | Status: AC
  Administered 2012-08-07: 1 mg via INTRAMUSCULAR
  Filled 2012-08-07: qty 1

## 2012-08-07 MED ORDER — IBUPROFEN 800 MG PO TABS: 800.0000 mg | ORAL_TABLET | Freq: Three times a day (TID) | ORAL | Status: DC

## 2012-08-07 MED ORDER — OXYCODONE-ACETAMINOPHEN 5-325 MG PO TABS: 1.0000 | ORAL_TABLET | Freq: Four times a day (QID) | ORAL | Status: DC | PRN

## 2012-08-07 NOTE — ED Notes (Signed)
Pt from home with reports of injuring left shoulder while mopping under a bed this past Thursday. Pt reports left neck and shoulder pain, has applied muscle rub and taken ibuprofen with no relief.

## 2012-08-07 NOTE — ED Provider Notes (Signed)
History     CSN: [id number]  Arrival date & time 08/07/12  4098   First MD Initiated Contact with Patient 08/07/12 [phone]      Chief Complaint  Patient presents with  . Neck Pain    Left  . Shoulder Pain    Left    (Consider location/radiation/quality/duration/timing/severity/associated sxs/prior treatment) Patient is a 47 y.o. female presenting with neck pain and shoulder pain. The history is provided by the patient (pt complains of neck pain radiating to left shoulder).  Neck Pain Pain location:  L side Quality:  Aching Pain radiates to:  Does not radiate Pain severity:  Moderate Pain is:  Same all the time Onset quality:  Gradual Timing:  Constant Progression:  Waxing and waning Associated symptoms: no chest pain and no headaches   Shoulder Pain Pertinent negatives include no chest pain, no abdominal pain and no headaches.    History reviewed. No pertinent past medical history.  Past Surgical History  Procedure Laterality Date  . Cesarean section    . Cholecystectomy      History reviewed. No pertinent family history.  History  Substance Use Topics  . Smoking status: Current Every Day Smoker  . Smokeless tobacco: Never Used  . Alcohol Use: Yes    OB History   Grav Para Term Preterm Abortions TAB SAB Ect Mult Living                  Review of Systems  Constitutional: Negative for fatigue.  HENT: Positive for neck pain. Negative for congestion, sinus pressure and ear discharge.   Eyes: Negative for discharge.  Respiratory: Negative for cough.   Cardiovascular: Negative for chest pain.  Gastrointestinal: Negative for abdominal pain and diarrhea.  Genitourinary: Negative for frequency and hematuria.  Musculoskeletal: Negative for back pain.       Pain left shoulder  Skin: Negative for rash.  Neurological: Negative for seizures and headaches.  Psychiatric/Behavioral: Negative for hallucinations.    Allergies  Sulfa antibiotics  Home Medications    Current Outpatient Rx  Name  Route  Sig  Dispense  Refill  . fluconazole (DIFLUCAN) 200 MG tablet      Take one with start of antibiotic.  Repeat in 72 hours if needed.   2 tablet   0   . HYDROcodone-acetaminophen (NORCO/VICODIN) 5-325 MG per tablet   Oral   Take 1 tablet by mouth every 6 (six) hours as needed for pain.   10 tablet   0   . ibuprofen (ADVIL,MOTRIN) 200 MG tablet   Oral   Take 800 mg by mouth 4 (four) times daily as needed for pain.          . Multiple Vitamin (MULTIVITAMIN WITH MINERALS) TABS   Oral   Take 1 tablet by mouth daily.         . penicillin v potassium (VEETID) 500 MG tablet   Oral   Take 1 tablet (500 mg total) by mouth 3 (three) times daily.   30 tablet   0   . ibuprofen (ADVIL,MOTRIN) 800 MG tablet   Oral   Take 1 tablet (800 mg total) by mouth 3 (three) times daily.   21 tablet   0   . oxyCODONE-acetaminophen (PERCOCET) 5-325 MG per tablet   Oral   Take 1 tablet by mouth every 6 (six) hours as needed for pain.   20 tablet   0     BP 121/86  Pulse 80  Temp(Src) 98  F (36.7 C) (Oral)  Resp 20  SpO2 100%  LMP 07/18/2012  Physical Exam  Constitutional: She is oriented to person, place, and time. She appears well-developed.  HENT:  Head: Normocephalic.  Eyes: Conjunctivae and EOM are normal. No scleral icterus.  Neck: Neck supple. No thyromegaly present.  Tender left lateral neck  Cardiovascular: Normal rate and regular rhythm.  Exam reveals no gallop and no friction rub.   No murmur heard. Pulmonary/Chest: No stridor. She has no wheezes. She has no rales. She exhibits no tenderness.  Abdominal: She exhibits no distension. There is no tenderness. There is no rebound.  Musculoskeletal: Normal range of motion. She exhibits no edema.  Tender post. Left shoulder  Lymphadenopathy:    She has no cervical adenopathy.  Neurological: She is oriented to person, place, and time. She has normal reflexes. Coordination normal.   Skin: No rash noted. No erythema.  Psychiatric: She has a normal mood and affect. Her behavior is normal.    ED Course  Procedures (including critical care time)  Labs Reviewed - No data to display Dg Cervical Spine Complete  08/07/2012  *RADIOLOGY REPORT*  Clinical Data: Left neck pain, no acute injury, pulling sensation  CERVICAL SPINE - COMPLETE 4+ VIEW  Comparison: None.  Findings:  The cervical vertebrae are in normal alignment. Intervertebral disc spaces are normal.  Minimal anterior osteophyte formation is present at C6-7.  No prevertebral soft tissue swelling is seen.  On oblique views the foramina are patent.  The odontoid process is intact.  The lung apices are clear.  IMPRESSION: Normal alignment.  Normal disc spaces.   Original Report Authenticated By: Dwyane Dee, M.D.    Dg Shoulder Left  08/07/2012  *RADIOLOGY REPORT*  Clinical Data: Injured with left shoulder pain  LEFT SHOULDER - 2+ VIEW  Comparison: None.  Findings: The left humeral head is in normal position and the left glenohumeral joint space is unremarkable.  The left AC joint is normally aligned with only mild degenerative change present.  No acute abnormality is seen.  IMPRESSION: No acute abnormality.  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   Original Report Authenticated By: Dwyane Dee, M.D.      1. Shoulder pain, left       MDM         Benny Lennert, MD 08/07/12 1024

## 2012-08-07 NOTE — ED Notes (Signed)
Patient transported to X-ray 

## 2012-08-16 ENCOUNTER — Emergency Department (HOSPITAL_COMMUNITY)
Admission: EM | Admit: 2012-08-16 | Discharge: 2012-08-16 | Disposition: A | Discharge status: Home / Self Care | Payer: No Typology Code available for payment source | Attending: Emergency Medicine | Admitting: Emergency Medicine

## 2012-08-16 ENCOUNTER — Emergency Department (HOSPITAL_COMMUNITY): Payer: No Typology Code available for payment source

## 2012-08-16 ENCOUNTER — Encounter (HOSPITAL_COMMUNITY): Payer: Self-pay | Admitting: Emergency Medicine

## 2012-08-16 DIAGNOSIS — S4980XA Other specified injuries of shoulder and upper arm, unspecified arm, initial encounter: Secondary | ICD-10-CM | POA: Insufficient documentation

## 2012-08-16 DIAGNOSIS — Y9389 Activity, other specified: Secondary | ICD-10-CM | POA: Insufficient documentation

## 2012-08-16 DIAGNOSIS — Y92410 Unspecified street and highway as the place of occurrence of the external cause: Secondary | ICD-10-CM | POA: Insufficient documentation

## 2012-08-16 DIAGNOSIS — F17200 Nicotine dependence, unspecified, uncomplicated: Secondary | ICD-10-CM | POA: Insufficient documentation

## 2012-08-16 DIAGNOSIS — S46909A Unspecified injury of unspecified muscle, fascia and tendon at shoulder and upper arm level, unspecified arm, initial encounter: Secondary | ICD-10-CM | POA: Insufficient documentation

## 2012-08-16 DIAGNOSIS — S0993XA Unspecified injury of face, initial encounter: Secondary | ICD-10-CM | POA: Insufficient documentation

## 2012-08-16 MED ORDER — METHOCARBAMOL 500 MG PO TABS: 500.0000 mg | ORAL_TABLET | Freq: Two times a day (BID) | ORAL | Status: DC

## 2012-08-16 MED ORDER — HYDROCODONE-ACETAMINOPHEN 5-325 MG PO TABS: 1.0000 | ORAL_TABLET | Freq: Four times a day (QID) | ORAL | Status: DC | PRN
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
## 2012-08-18 NOTE — ED Provider Notes (Signed)
Medical screening examination/treatment/procedure(s) were performed by non-physician practitioner and as supervising physician I was immediately available for consultation/collaboration.  Owain Eckerman L Mileigh Tilley, MD 08/18/12 2238 

## 2012-09-11 ENCOUNTER — Emergency Department (HOSPITAL_COMMUNITY)
Admission: EM | Admit: 2012-09-11 | Discharge: 2012-09-11 | Disposition: A | Discharge status: Home / Self Care | Payer: No Typology Code available for payment source | Attending: Emergency Medicine | Admitting: Emergency Medicine

## 2012-09-11 ENCOUNTER — Encounter (HOSPITAL_COMMUNITY): Payer: Self-pay | Admitting: Emergency Medicine

## 2012-09-11 DIAGNOSIS — M25539 Pain in unspecified wrist: Secondary | ICD-10-CM | POA: Insufficient documentation

## 2012-09-11 DIAGNOSIS — F17200 Nicotine dependence, unspecified, uncomplicated: Secondary | ICD-10-CM | POA: Insufficient documentation

## 2012-09-11 DIAGNOSIS — M549 Dorsalgia, unspecified: Secondary | ICD-10-CM | POA: Insufficient documentation

## 2012-09-11 DIAGNOSIS — Z8669 Personal history of other diseases of the nervous system and sense organs: Secondary | ICD-10-CM | POA: Insufficient documentation

## 2012-09-11 DIAGNOSIS — R209 Unspecified disturbances of skin sensation: Secondary | ICD-10-CM | POA: Insufficient documentation

## 2012-09-11 DIAGNOSIS — M542 Cervicalgia: Secondary | ICD-10-CM | POA: Insufficient documentation

## 2012-09-11 DIAGNOSIS — Z87828 Personal history of other (healed) physical injury and trauma: Secondary | ICD-10-CM | POA: Insufficient documentation

## 2012-09-11 DIAGNOSIS — IMO0001 Reserved for inherently not codable concepts without codable children: Secondary | ICD-10-CM | POA: Insufficient documentation

## 2012-09-11 DIAGNOSIS — M62838 Other muscle spasm: Secondary | ICD-10-CM | POA: Insufficient documentation

## 2012-09-11 DIAGNOSIS — M79602 Pain in left arm: Secondary | ICD-10-CM

## 2012-09-11 MED ORDER — METHOCARBAMOL 500 MG PO TABS: 500.0000 mg | ORAL_TABLET | Freq: Two times a day (BID) | ORAL | Status: DC | PRN

## 2012-09-11 MED ORDER — TRAMADOL HCL 50 MG PO TABS: 50.0000 mg | ORAL_TABLET | Freq: Four times a day (QID) | ORAL | Status: DC | PRN
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
## 2012-09-12 NOTE — ED Provider Notes (Signed)
Medical screening examination/treatment/procedure(s) were performed by non-physician practitioner and as supervising physician I was immediately available for consultation/collaboration.    Nelia Shi, MD 09/12/12 [phone]

## 2012-11-06 ENCOUNTER — Emergency Department (HOSPITAL_COMMUNITY)
Admission: EM | Admit: 2012-11-06 | Discharge: 2012-11-06 | Disposition: A | Discharge status: Home / Self Care | Payer: Medicaid Other | Attending: Emergency Medicine | Admitting: Emergency Medicine

## 2012-11-06 ENCOUNTER — Encounter (HOSPITAL_COMMUNITY): Payer: Self-pay | Admitting: *Deleted

## 2012-11-06 DIAGNOSIS — K029 Dental caries, unspecified: Secondary | ICD-10-CM | POA: Insufficient documentation

## 2012-11-06 DIAGNOSIS — Z79899 Other long term (current) drug therapy: Secondary | ICD-10-CM | POA: Insufficient documentation

## 2012-11-06 DIAGNOSIS — F17200 Nicotine dependence, unspecified, uncomplicated: Secondary | ICD-10-CM | POA: Insufficient documentation

## 2012-11-06 DIAGNOSIS — K0889 Other specified disorders of teeth and supporting structures: Secondary | ICD-10-CM

## 2012-11-06 DIAGNOSIS — R51 Headache: Secondary | ICD-10-CM | POA: Insufficient documentation

## 2012-11-06 DIAGNOSIS — K089 Disorder of teeth and supporting structures, unspecified: Secondary | ICD-10-CM | POA: Insufficient documentation

## 2012-11-06 MED ORDER — PENICILLIN V POTASSIUM 500 MG PO TABS: 500.0000 mg | ORAL_TABLET | Freq: Four times a day (QID) | ORAL | Status: DC

## 2012-11-06 MED ORDER — FLUCONAZOLE 150 MG PO TABS: 150.0000 mg | ORAL_TABLET | Freq: Once | ORAL | Status: DC

## 2012-11-06 MED ORDER — HYDROCODONE-ACETAMINOPHEN 5-325 MG PO TABS: 1.0000 | ORAL_TABLET | Freq: Four times a day (QID) | ORAL | Status: DC | PRN

## 2012-11-06 NOTE — ED Provider Notes (Signed)
History    CSN: [id number] Arrival date & time 11/06/12  1038  First MD Initiated Contact with Patient 11/06/12 1048     Chief Complaint  Patient presents with  . Dental Pain   (Consider location/radiation/quality/duration/timing/severity/associated sxs/prior Treatment) Patient is a 47 y.o. female presenting with tooth pain. The history is provided by the patient. No language interpreter was used.  Dental Pain Location:  Lower Lower teeth location:  28/RL 1st bicuspid and 29/RL 2nd bicuspid Quality:  Throbbing Severity:  Moderate Onset quality:  Gradual Duration:  3 days Timing:  Constant Progression:  Worsening Chronicity:  New Context: dental caries, filling fell out and poor dentition   Context: not trauma   Previous work-up:  Dental exam and filled cavity Relieved by:  None tried Worsened by:  Cold food/drink, hot food/drink and touching Ineffective treatments:  None tried Associated symptoms: headaches   Associated symptoms: no difficulty swallowing, no drooling, no facial pain, no facial swelling, no fever, no gum swelling, no neck pain, no neck swelling, no oral bleeding, no oral lesions and no trismus   Risk factors: lack of dental care and smoking    History reviewed. No pertinent past medical history. Past Surgical History  Procedure Laterality Date  . Cesarean section    . Cholecystectomy     Family History  Problem Relation Age of Onset  . Cancer Mother   . Hypertension Mother   . Hypertension Father    History  Substance Use Topics  . Smoking status: Current Every Day Smoker  . Smokeless tobacco: Never Used  . Alcohol Use: Yes   OB History   Grav Para Term Preterm Abortions TAB SAB Ect Mult Living                 Review of Systems  Constitutional: Negative for fever.  HENT: Positive for dental problem (pain). Negative for facial swelling, drooling, mouth sores and neck pain.   Neurological: Positive for headaches.  All other systems reviewed  and are negative.   Allergies  Ibuprofen and Sulfa antibiotics  Home Medications   Current Outpatient Rx  Name  Route  Sig  Dispense  Refill  . aspirin-acetaminophen-caffeine (EXCEDRIN MIGRAINE) 250-250-65 MG per tablet   Oral   Take 2 tablets by mouth every 6 (six) hours as needed for pain.         . Multiple Vitamin (MULTIVITAMIN WITH MINERALS) TABS   Oral   Take 1 tablet by mouth daily.         . fluconazole (DIFLUCAN) 150 MG tablet   Oral   Take 1 tablet (150 mg total) by mouth once.   1 tablet   0   . HYDROcodone-acetaminophen (NORCO/VICODIN) 5-325 MG per tablet   Oral   Take 1 tablet by mouth every 6 (six) hours as needed for pain.   12 tablet   0   . penicillin v potassium (VEETID) 500 MG tablet   Oral   Take 1 tablet (500 mg total) by mouth 4 (four) times daily.   40 tablet   0    BP 159/91  Pulse 90  Temp(Src) 98.6 F (37 C) (Oral)  Resp 18  SpO2 100%  Physical Exam  Nursing note and vitals reviewed. Constitutional: She is oriented to person, place, and time. She appears well-developed and well-nourished. No distress.  HENT:  Head: Normocephalic and atraumatic. No trismus in the jaw.  Right Ear: Tympanic membrane, external ear and ear canal normal. No mastoid  tenderness.  Left Ear: Tympanic membrane, external ear and ear canal normal. No mastoid tenderness.  Nose: Nose normal.  Mouth/Throat: Uvula is midline, oropharynx is clear and moist and mucous membranes are normal. No oral lesions. Abnormal dentition. Dental caries present. No dental abscesses.    Airway patent and patient tolerating secretions without difficulty  Eyes: Conjunctivae and EOM are normal. Pupils are equal, round, and reactive to light. No scleral icterus.  Neck: Normal range of motion. Neck supple.  Cardiovascular: Normal rate, regular rhythm and normal heart sounds.   Pulmonary/Chest: Effort normal. No stridor. No respiratory distress.  Lymphadenopathy:    She has no  cervical adenopathy.  Neurological: She is alert and oriented to person, place, and time.  Skin: Skin is warm and dry. No rash noted. She is not diaphoretic. No erythema. No pallor.  Psychiatric: She has a normal mood and affect. Her behavior is normal.   ED Course  Procedures (including critical care time) Labs Reviewed - No data to display No results found.  1. Pain, dental    MDM  Uncomplicated dental pain. Patient afebrile and well and nontoxic appearing. Physical exam findings as above. There is no trismus or stridor. Airway patent and patient tolerating secretions without difficulty. No area of fluctuance 2 suspect drainable dental abscess. Patient appropriate for discharge with Vicodin for pain control and penicillin to cover for infection. Patient states that she often gets yeast infections when taking antibiotics; fluconazole prescribed. Indications for ED return discussed and dental referral provided. Patient states comfort and understanding with this discharge plan with no unaddressed concerns.  Antony Madura, PA-C 11/06/12 1819

## 2012-11-06 NOTE — ED Notes (Signed)
Pt states her filling fell out several weeks ago. Said she has a Education officer, community but they won't see her until she pays unpaid balance. Started with right lower tooth pain 2 days ago.

## 2012-11-06 NOTE — ED Notes (Signed)
Pt states that she had right lower molar pain and filling fell out

## 2012-11-07 NOTE — ED Provider Notes (Signed)
Medical screening examination/treatment/procedure(s) were performed by non-physician practitioner and as supervising physician I was immediately available for consultation/collaboration.  Ashby Dawes, MD 11/07/12 1330

## 2013-03-20 ENCOUNTER — Emergency Department: Payer: Self-pay | Admitting: Emergency Medicine

## 2013-05-15 ENCOUNTER — Encounter (HOSPITAL_COMMUNITY): Payer: Self-pay | Admitting: Emergency Medicine

## 2013-05-15 ENCOUNTER — Emergency Department (HOSPITAL_COMMUNITY)
Admission: EM | Admit: 2013-05-15 | Discharge: 2013-05-15 | Disposition: A | Discharge status: Home / Self Care | Payer: No Typology Code available for payment source | Attending: Emergency Medicine | Admitting: Emergency Medicine

## 2013-05-15 DIAGNOSIS — M79602 Pain in left arm: Secondary | ICD-10-CM

## 2013-05-15 DIAGNOSIS — M79609 Pain in unspecified limb: Secondary | ICD-10-CM | POA: Insufficient documentation

## 2013-05-15 DIAGNOSIS — Z862 Personal history of diseases of the blood and blood-forming organs and certain disorders involving the immune mechanism: Secondary | ICD-10-CM | POA: Insufficient documentation

## 2013-05-15 DIAGNOSIS — F17200 Nicotine dependence, unspecified, uncomplicated: Secondary | ICD-10-CM | POA: Insufficient documentation

## 2013-05-15 DIAGNOSIS — Z8639 Personal history of other endocrine, nutritional and metabolic disease: Secondary | ICD-10-CM | POA: Insufficient documentation

## 2013-05-15 HISTORY — DX: Pure hypercholesterolemia, unspecified: E78.00

## 2013-05-15 MED ORDER — HYDROCODONE-ACETAMINOPHEN 5-325 MG PO TABS: 1.0000 | ORAL_TABLET | Freq: Four times a day (QID) | ORAL | Status: DC | PRN

## 2013-05-15 NOTE — ED Provider Notes (Signed)
CSN: [id number]     Arrival date & time 05/15/13  [id number] History   First MD Initiated Contact with Patient 05/15/13 0820     Chief Complaint  Patient presents with  . Arm Pain    HPI  Patient presents with arm pain. Patient's pain has been present for almost one year, worse over the past months.  She describes diffuse pain throughout the distal left upper extremity.  There is occasional radiation towards the left shoulder.  There is no concurrent chest pain, dyspnea, lightheadedness, syncope, neck pain, head pain. Patient has not been taking any medication for pain relief, nor is she using ice packs, and she denies any physical therapy or strengthening exercises.  Pain has been persistent without appreciable change, but the patient is frustrated at the persistent pain.   Past Medical History  Diagnosis Date  . Hypercholesteremia    Past Surgical History  Procedure Laterality Date  . Cesarean section    . Cholecystectomy     Family History  Problem Relation Age of Onset  . Cancer Mother   . Hypertension Mother   . Hypertension Father    History  Substance Use Topics  . Smoking status: Current Every Day Smoker  . Smokeless tobacco: Never Used  . Alcohol Use: Yes   OB History   Grav Para Term Preterm Abortions TAB SAB Ect Mult Living                 Review of Systems  All other systems reviewed and are negative.    Allergies  Ibuprofen and Sulfa antibiotics  Home Medications   Current Outpatient Rx  Name  Route  Sig  Dispense  Refill  . Multiple Vitamin (MULTIVITAMIN WITH MINERALS) TABS   Oral   Take 1 tablet by mouth daily.         Marland Kitchen. HYDROcodone-acetaminophen (NORCO/VICODIN) 5-325 MG per tablet   Oral   Take 1 tablet by mouth every 6 (six) hours as needed.   15 tablet   0    BP 137/96  Pulse 97  Temp(Src) 98.4 F (36.9 C) (Oral)  Resp 20  SpO2 98%  LMP 05/07/2013 Physical Exam  Nursing note and vitals reviewed. Constitutional: She is oriented to  person, place, and time. She appears well-developed and well-nourished. No distress.  HENT:  Head: Normocephalic and atraumatic.  Eyes: Conjunctivae and EOM are normal.  Cardiovascular: Normal rate, regular rhythm and intact distal pulses.   Pulmonary/Chest: Effort normal. No stridor. No respiratory distress.  Abdominal: She exhibits no distension.  Musculoskeletal: She exhibits no edema.  No discernible deformity of the left shoulder, elbow, wrist, hand. Range of motion in all of his extremities is appropriate except for the shoulder which has limited abduction to 90   Neurological: She is alert and oriented to person, place, and time. No cranial nerve deficit.  Skin: Skin is warm and dry.  Psychiatric: She has a normal mood and affect.    ED Course  Procedures (including critical care time) Labs Review Labs Reviewed - No data to display Imaging Review No results found.  EKG Interpretation   None       MDM   1. Arm pain, left    Patient presents with ongoing musculoskeletal pain.  Patient neurovascularly intact, hemodynamic stable, and with the chronicity of her pain she was discharged in stable condition to follow up with orthopedics.    Gerhard Munchobert Glendell Fouse, MD 05/15/13 [id number]

## 2013-05-15 NOTE — Discharge Instructions (Signed)
As discussed, with your persistent arm pain is important to take all medication as directed, use ice packs as discussed, and follow up with her orthopedic surgeon for further evaluation and management.
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
## 2013-05-15 NOTE — ED Notes (Signed)
PT ambulated with baseline gait; VSS; A&Ox3; no signs of distress; respirations even and unlabored; skin warm and dry; no questions upon discharge.  

## 2013-11-25 ENCOUNTER — Emergency Department (HOSPITAL_COMMUNITY)
Admission: EM | Admit: 2013-11-25 | Discharge: 2013-11-25 | Disposition: A | Discharge status: Home / Self Care | Payer: Medicaid Other | Attending: Emergency Medicine | Admitting: Emergency Medicine

## 2013-11-25 ENCOUNTER — Encounter (HOSPITAL_COMMUNITY): Payer: Self-pay | Admitting: Emergency Medicine

## 2013-11-25 DIAGNOSIS — K089 Disorder of teeth and supporting structures, unspecified: Secondary | ICD-10-CM | POA: Diagnosis present

## 2013-11-25 DIAGNOSIS — F17200 Nicotine dependence, unspecified, uncomplicated: Secondary | ICD-10-CM | POA: Diagnosis not present

## 2013-11-25 DIAGNOSIS — Z862 Personal history of diseases of the blood and blood-forming organs and certain disorders involving the immune mechanism: Secondary | ICD-10-CM | POA: Diagnosis not present

## 2013-11-25 DIAGNOSIS — K0889 Other specified disorders of teeth and supporting structures: Secondary | ICD-10-CM

## 2013-11-25 DIAGNOSIS — Z79899 Other long term (current) drug therapy: Secondary | ICD-10-CM | POA: Diagnosis not present

## 2013-11-25 DIAGNOSIS — Z8639 Personal history of other endocrine, nutritional and metabolic disease: Secondary | ICD-10-CM | POA: Insufficient documentation

## 2013-11-25 MED ORDER — OXYCODONE-ACETAMINOPHEN 5-325 MG PO TABS: 1.0000 | ORAL_TABLET | Freq: Four times a day (QID) | ORAL | Status: DC | PRN

## 2013-11-25 MED ORDER — PENICILLIN V POTASSIUM 500 MG PO TABS: 500.0000 mg | ORAL_TABLET | Freq: Three times a day (TID) | ORAL | Status: DC

## 2013-11-25 NOTE — ED Provider Notes (Signed)
CSN: [id number]     Arrival date & time 11/25/13  1558 History   First MD Initiated Contact with Patient 11/25/13 1713     Chief Complaint  Patient presents with  . Dental Pain     (Consider location/radiation/quality/duration/timing/severity/associated sxs/prior Treatment) HPI Pt presents with c/o right lower tooth pain.  Pt states the filling came out of that tooth 2 weeks ago but did not hurt until today.  No fever/chills.  No difficulty swallowing or breathing.  She has not taken anything for her symptoms.  She does not have dental followup.  There are no other associated systemic symptoms, there are no other alleviating or modifying factors.   Past Medical History  Diagnosis Date  . Hypercholesteremia    Past Surgical History  Procedure Laterality Date  . Cesarean section    . Cholecystectomy     Family History  Problem Relation Age of Onset  . Cancer Mother   . Hypertension Mother   . Hypertension Father    History  Substance Use Topics  . Smoking status: Current Every Day Smoker  . Smokeless tobacco: Never Used  . Alcohol Use: Yes   OB History   Grav Para Term Preterm Abortions TAB SAB Ect Mult Living                 Review of Systems ROS reviewed and all otherwise negative except for mentioned in HPI    Allergies  Ibuprofen and Sulfa antibiotics  Home Medications   Prior to Admission medications   Medication Sig Start Date End Date Taking? Authorizing Provider  amoxicillin (AMOXIL) 500 MG capsule Take 500 mg by mouth once as needed ("help with pain in tooth").   Yes Historical Provider, MD  Multiple Vitamin (MULTIVITAMIN WITH MINERALS) TABS Take 1 tablet by mouth daily.   Yes Historical Provider, MD  traMADol (ULTRAM) 50 MG tablet Take 100 mg by mouth every 6 (six) hours as needed for moderate pain.   Yes Historical Provider, MD  oxyCODONE-acetaminophen (PERCOCET/ROXICET) 5-325 MG per tablet Take 1-2 tablets by mouth every 6 (six) hours as needed for  severe pain. 11/25/13   Ethelda ChickMartha K Linker, MD  penicillin v potassium (VEETID) 500 MG tablet Take 1 tablet (500 mg total) by mouth 3 (three) times daily. 11/25/13   Ethelda ChickMartha K Linker, MD   BP 151/84  Pulse 74  Temp(Src) 98.6 F (37 C) (Oral)  Resp 18  SpO2 100%  LMP 11/07/2013 Vitals reviewed Physical Exam Physical Examination: General appearance - alert, well appearing, and in no distress Mental status - alert, oriented to person, place, and time Eyes - no conjunctival injection, no scleral icterus Mouth - mucous membranes moist, pharynx normal without lesions, diffuse dental decay, erosion of right lower premolar, no gingival or periapical abscess Neck - supple, no significant adenopathy Chest - clear to auscultation, no wheezes, rales or rhonchi, symmetric air entry Heart - normal rate, regular rhythm, normal S1, S2, no murmurs, rubs, clicks or gallops Skin - normal coloration and turgor, no rashes  ED Course  Procedures (including critical care time) Labs Review Labs Reviewed - No data to display  Imaging Review No results found.   EKG Interpretation None      MDM   Final diagnoses:  Pain, dental    Pt presenting with c/o dental pain.  Will start on abx, given pain medication.  Will give dental followup information.  No signs of abscess.   Patient is overall nontoxic and well hydrated in  appearance.   Nursing notes including past medical history and social history reviewed and considered in documentation Prior records reviewed and considered during this visit     Ethelda Chick, MD 11/25/13 2121

## 2013-11-25 NOTE — ED Notes (Signed)
She c/o right lower toothache since yesterday.  She states a "filling fell out of it some weeks ago".  She is in no distress.

## 2013-11-25 NOTE — Discharge Instructions (Signed)
Return to the ED with any concerns including fever/chills, difficulty breathing or swallowing, or any other alarming symptoms

## 2014-02-21 ENCOUNTER — Emergency Department (HOSPITAL_COMMUNITY)
Admission: EM | Admit: 2014-02-21 | Discharge: 2014-02-21 | Disposition: A | Discharge status: Home / Self Care | Payer: Medicaid Other | Attending: Emergency Medicine | Admitting: Emergency Medicine

## 2014-02-21 ENCOUNTER — Encounter (HOSPITAL_COMMUNITY): Payer: Self-pay | Admitting: Emergency Medicine

## 2014-02-21 DIAGNOSIS — Z79899 Other long term (current) drug therapy: Secondary | ICD-10-CM | POA: Diagnosis not present

## 2014-02-21 DIAGNOSIS — M25532 Pain in left wrist: Secondary | ICD-10-CM | POA: Insufficient documentation

## 2014-02-21 DIAGNOSIS — Z8639 Personal history of other endocrine, nutritional and metabolic disease: Secondary | ICD-10-CM | POA: Diagnosis not present

## 2014-02-21 DIAGNOSIS — M79642 Pain in left hand: Secondary | ICD-10-CM | POA: Diagnosis present

## 2014-02-21 DIAGNOSIS — Z87828 Personal history of other (healed) physical injury and trauma: Secondary | ICD-10-CM | POA: Insufficient documentation

## 2014-02-21 DIAGNOSIS — Z720 Tobacco use: Secondary | ICD-10-CM | POA: Insufficient documentation

## 2014-02-21 DIAGNOSIS — G5602 Carpal tunnel syndrome, left upper limb: Secondary | ICD-10-CM | POA: Insufficient documentation

## 2014-02-21 MED ORDER — OXYCODONE-ACETAMINOPHEN 5-325 MG PO TABS: 1.0000 | ORAL_TABLET | ORAL | Status: DC | PRN

## 2014-02-21 MED ORDER — OXYCODONE-ACETAMINOPHEN 5-325 MG PO TABS
1.0000 | ORAL_TABLET | Freq: Once | ORAL | Status: AC
  Administered 2014-02-21: 1 via ORAL
  Filled 2014-02-21: qty 1

## 2014-02-21 NOTE — ED Notes (Signed)
Pt reports the cause in the past was a pinched nerve in the neck

## 2014-02-21 NOTE — ED Notes (Signed)
Declined W/C at D/C and was escorted to lobby by RN. 

## 2014-02-21 NOTE — ED Provider Notes (Signed)
CSN: [id number]     Arrival date & time 02/21/14  1258 History  This chart was scribed for non-physician practitioner, Arthor CaptainAbigail Paytin Ramakrishnan, PA-C, working with Flint MelterElliott L Wentz, MD by Charline BillsEssence Howell, ED Scribe. This patient was seen in room TR08C/TR08C and the patient's care was started at 3:01 PM.   Chief Complaint  Patient presents with  . Hand Pain   The history is provided by the patient. No language interpreter was used.   HPI Comments: [name] is a 48 y.o. female who presents to the Emergency Department complaining of constant L forearm and L wrist pain onset 2 weeks ago. She reports initial L arm pain 2 years ago following an accident. Pt reports that she started working 2 weeks ago and suspects that this exacerbated pain. She describes the pain as a burning sensation that radiates up her arm and is worse at night. Pt also reports numbness/tingling sensation in her fingers. Pt is L hand dominant. She has tried a wrist brace, muscle rub and muscle relaxants with mild relief.  Past Medical History  Diagnosis Date  . Hypercholesteremia    Past Surgical History  Procedure Laterality Date  . Cesarean section    . Cholecystectomy     Family History  Problem Relation Age of Onset  . Cancer Mother   . Hypertension Mother   . Hypertension Father    History  Substance Use Topics  . Smoking status: Current Every Day Smoker  . Smokeless tobacco: Never Used  . Alcohol Use: Yes   OB History   Grav Para Term Preterm Abortions TAB SAB Ect Mult Living                 Review of Systems  Musculoskeletal: Positive for arthralgias and myalgias.  Neurological: Positive for numbness.  All other systems reviewed and are negative.  Allergies  Ibuprofen and Sulfa antibiotics  Home Medications   Prior to Admission medications   Medication Sig Start Date End Date Taking? Authorizing Provider  Multiple Vitamin (MULTIVITAMIN WITH MINERALS) TABS Take 1 tablet by mouth daily.   Yes Historical  Provider, MD  Nutritional Supplements (ESTROVEN PO) Take 1 tablet by mouth daily.   Yes Historical Provider, MD   Triage Vitals: BP 149/89  Pulse 107  Temp(Src) 98.2 F (36.8 C) (Oral)  Resp 16  SpO2 100% Physical Exam  Nursing note and vitals reviewed. Constitutional: She is oriented to person, place, and time. She appears well-developed and well-nourished. No distress.  HENT:  Head: Normocephalic and atraumatic.  Eyes: Conjunctivae and EOM are normal.  Neck: Neck supple.  Pulmonary/Chest: Effort normal. No respiratory distress.  Musculoskeletal: Normal range of motion.  L arm: Positive tinels Tender to palpation  No atrophy Pulses intact  Neurological: She is alert and oriented to person, place, and time.  Skin: Skin is warm and dry.  Psychiatric: She has a normal mood and affect. Her behavior is normal.   ED Course  Procedures (including critical care time) DIAGNOSTIC STUDIES: Oxygen Saturation is 100% on RA, normal by my interpretation.    COORDINATION OF CARE: 3:06 PM-Discussed treatment plan which includes ice, anti-inflammatory, medication for pain and follow-up with ortho with pt at bedside and pt agreed to plan.   Labs Review Labs Reviewed - No data to display  Imaging Review No results found.   EKG Interpretation None      MDM   Final diagnoses:  Carpal tunnel syndrome of left wrist     PLAN: rest  the injured area as much as practical, apply ice packs, referral to Orthopedics for this injury See orders for this visit as documented in the electronic medical record. Splint at home. Patient pain medication   I personally performed the services described in this documentation, which was scribed in my presence. The recorded information has been reviewed and is accurate.    Arthor CaptainAbigail Shernita Rabinovich, PA-C 02/21/14 1512

## 2014-02-21 NOTE — ED Provider Notes (Signed)
Medical screening examination/treatment/procedure(s) were performed by non-physician practitioner and as supervising physician I was immediately available for consultation/collaboration.  Chaney Ingram L Ashland Osmer, MD 02/21/14 1711 

## 2014-02-21 NOTE — ED Notes (Signed)
Pt reports pain in left forearm wrist, sts had ana ccident 2 years ago and had pain and this episode started 2 weeks ago.

## 2014-02-21 NOTE — Discharge Instructions (Signed)
Carpal Tunnel Syndrome The carpal tunnel is a narrow area located on the palm side of your wrist. The tunnel is formed by the wrist bones and ligaments. Nerves, blood vessels, and tendons pass through the carpal tunnel. Repeated wrist motion or certain diseases may cause swelling within the tunnel. This swelling pinches the main nerve in the wrist (median nerve) and causes the painful hand and arm condition called carpal tunnel syndrome. CAUSES   Repeated wrist motions.  Wrist injuries.  Certain diseases like arthritis, diabetes, alcoholism, hyperthyroidism, and kidney failure.  Obesity.  Pregnancy. SYMPTOMS   A "pins and needles" feeling in your fingers or hand, especially in your thumb, index and middle fingers.  Tingling or numbness in your fingers or hand.  An aching feeling in your entire arm, especially when your wrist and elbow are bent for long periods of time.  Wrist pain that goes up your arm to your shoulder.  Pain that goes down into your palm or fingers.  A weak feeling in your hands. DIAGNOSIS  Your health care provider will take your history and perform a physical exam. An electromyography test may be needed. This test measures electrical signals sent out by your nerves into the muscles. The electrical signals are usually slowed by carpal tunnel syndrome. You may also need X-rays. TREATMENT  Carpal tunnel syndrome may clear up by itself. Your health care provider may recommend a wrist splint or medicine such as a nonsteroidal anti-inflammatory medicine. Cortisone injections may help. Sometimes, surgery may be needed to free the pinched nerve.  HOME CARE INSTRUCTIONS   Take all medicine as directed by your health care provider. Only take over-the-counter or prescription medicines for pain, discomfort, or fever as directed by your health care provider.  If you were given a splint to keep your wrist from bending, wear it as directed. It is important to wear the splint at  night. Wear the splint for as long as you have pain or numbness in your hand, arm, or wrist. This may take 1 to 2 months.  Rest your wrist from any activity that may be causing your pain. If your symptoms are work-related, you may need to talk to your employer about changing to a job that does not require using your wrist.  Put ice on your wrist after long periods of wrist activity.  Put ice in a plastic bag.  Place a towel between your skin and the bag.  Leave the ice on for 15-20 minutes, 03-04 times a day.  Keep all follow-up visits as directed by your health care provider. This includes any orthopedic referrals, physical therapy, and rehabilitation. Any delay in getting necessary care could result in a delay or failure of your condition to heal. SEEK IMMEDIATE MEDICAL CARE IF:   You have new, unexplained symptoms.  Your symptoms get worse and are not helped or controlled with medicines. MAKE SURE YOU:   Understand these instructions.  Will watch your condition.  Will get help right away if you are not doing well or get worse. Document Released: 04/09/2000 Document Revised: 08/27/2013 Document Reviewed: 02/26/2011 ExitCare Patient Information 2015 [employer]. This information is not intended to replace advice given to you by your health care provider. Make sure you discuss any questions you have with your health care provider.  

## 2014-03-14 ENCOUNTER — Other Ambulatory Visit: Payer: Self-pay

## 2014-03-14 DIAGNOSIS — Z1231 Encounter for screening mammogram for malignant neoplasm of breast: Secondary | ICD-10-CM

## 2014-03-19 ENCOUNTER — Ambulatory Visit: Payer: Medicaid Other

## 2014-04-02 ENCOUNTER — Encounter (HOSPITAL_COMMUNITY): Payer: Self-pay

## 2014-04-02 ENCOUNTER — Emergency Department (HOSPITAL_COMMUNITY)
Admission: EM | Admit: 2014-04-02 | Discharge: 2014-04-02 | Disposition: A | Discharge status: Home / Self Care | Payer: Medicaid Other | Attending: Emergency Medicine | Admitting: Emergency Medicine

## 2014-04-02 DIAGNOSIS — Z720 Tobacco use: Secondary | ICD-10-CM | POA: Diagnosis not present

## 2014-04-02 DIAGNOSIS — R109 Unspecified abdominal pain: Secondary | ICD-10-CM | POA: Diagnosis present

## 2014-04-02 DIAGNOSIS — Z79899 Other long term (current) drug therapy: Secondary | ICD-10-CM | POA: Insufficient documentation

## 2014-04-02 DIAGNOSIS — R350 Frequency of micturition: Secondary | ICD-10-CM

## 2014-04-02 DIAGNOSIS — Z8639 Personal history of other endocrine, nutritional and metabolic disease: Secondary | ICD-10-CM | POA: Insufficient documentation

## 2014-04-02 LAB — URINALYSIS, ROUTINE W REFLEX MICROSCOPIC
Bilirubin Urine: NEGATIVE
GLUCOSE, UA: NEGATIVE mg/dL
Ketones, ur: NEGATIVE mg/dL
Leukocytes, UA: NEGATIVE
Nitrite: NEGATIVE
Protein, ur: NEGATIVE mg/dL
SPECIFIC GRAVITY, URINE: 1.022 (ref 1.005–1.030)
Urobilinogen, UA: 0.2 mg/dL (ref 0.0–1.0)
pH: 6 (ref 5.0–8.0)

## 2014-04-02 LAB — WET PREP, GENITAL
CLUE CELLS WET PREP: NONE SEEN
Trich, Wet Prep: NONE SEEN
WBC WET PREP: NONE SEEN
Yeast Wet Prep HPF POC: NONE SEEN

## 2014-04-02 LAB — URINE MICROSCOPIC-ADD ON

## 2014-04-02 NOTE — ED Provider Notes (Signed)
CSN: [id number]     Arrival date & time 04/02/14  0941 History   First MD Initiated Contact with Patient 04/02/14 [phone]     Chief Complaint  Patient presents with  . Groin Pain  . Nausea     (Consider location/radiation/quality/duration/timing/severity/associated sxs/prior Treatment) HPI Comments: 48 year old female presenting with concerns of a urinary tract infection over the past month. Patient reports over the past month she's had intermittent lower abdominal pain radiating to both sides of her back with associated increased urinary frequency and urgency. Admits to nausea without vomiting. Denies dysuria, hematuria or GYN complaints. She tried taking her husband's leftover amoxicillin from a prior infection with temporary relief. She states she "knows this is a urinary tract infection". She reports her last UTI was about 6 months ago. Denies fever or chills. On arrival to the emergency department, she refused to change into a gown because there is "no reason, I know my body and I know this is a UTI".  Patient is a 48 y.o. female presenting with groin pain. The history is provided by the patient.  Groin Pain    Past Medical History  Diagnosis Date  . Hypercholesteremia    Past Surgical History  Procedure Laterality Date  . Cesarean section    . Cholecystectomy     Family History  Problem Relation Age of Onset  . Cancer Mother   . Hypertension Mother   . Hypertension Father    History  Substance Use Topics  . Smoking status: Current Every Day Smoker  . Smokeless tobacco: Never Used  . Alcohol Use: Yes   OB History    No data available     Review of Systems  10 Systems reviewed and are negative for acute change except as noted in the HPI.  Allergies  Ibuprofen and Sulfa antibiotics  Home Medications   Prior to Admission medications   Medication Sig Start Date End Date Taking? Authorizing Provider  BLACK COHOSH EXTRACT PO Take 2 tablets by mouth daily.   Yes  Historical Provider, MD  Multiple Vitamin (MULTIVITAMIN WITH MINERALS) TABS Take 1 tablet by mouth daily.   Yes Historical Provider, MD  oxyCODONE-acetaminophen (ROXICET) 5-325 MG per tablet Take 1-2 tablets by mouth every 4 (four) hours as needed for severe pain. Patient not taking: Reported on 04/02/2014 02/21/14   Arthor CaptainAbigail Harris, PA-C   BP 149/97 mmHg  Pulse 90  Temp(Src) 97.8 F (36.6 C) (Oral)  Ht 5\' 4"  (1.626 m)  Wt 163 lb (73.936 kg)  BMI 27.97 kg/m2  SpO2 100%  LMP 03/18/2014 Physical Exam  Constitutional: She is oriented to person, place, and time. She appears well-developed and well-nourished. No distress.  HENT:  Head: Normocephalic and atraumatic.  Mouth/Throat: Oropharynx is clear and moist.  Eyes: Conjunctivae are normal.  Neck: Normal range of motion. Neck supple.  Cardiovascular: Normal rate, regular rhythm and normal heart sounds.   Pulmonary/Chest: Effort normal and breath sounds normal.  Abdominal: Soft. Bowel sounds are normal.  Mild suprapubic tenderness. No peritoneal signs. No CVA tenderness.  Genitourinary:  Scant white vaginal discharge. No erythema or bleeding. No CMT.  Musculoskeletal: Normal range of motion. She exhibits no edema.  Neurological: She is alert and oriented to person, place, and time.  Skin: Skin is warm and dry. She is not diaphoretic.  Psychiatric: She has a normal mood and affect. Her behavior is normal.    ED Course  Procedures (including critical care time) Labs Review Labs Reviewed  URINALYSIS, ROUTINE  W REFLEX MICROSCOPIC - Abnormal; Notable for the following:    APPearance CLOUDY (*)    Hgb urine dipstick SMALL (*)    All other components within normal limits  URINE MICROSCOPIC-ADD ON - Abnormal; Notable for the following:    Squamous Epithelial / LPF MANY (*)    All other components within normal limits  WET PREP, GENITAL  GC/CHLAMYDIA PROBE AMP    Imaging Review No results found.   EKG Interpretation None       MDM   Final diagnoses:  Urinary frequency   Patient in no apparent distress. Afebrile, vital signs stable. Abdomen is soft with mild suprapubic tenderness, no peritoneal signs. No CVA tenderness. Urinalysis without evidence of infection. Wet prep negative. I do not feel antibiotic treatment is appropriate at this time. Urine culture pending given patient's symptoms. Follow-up with PCP. Stable for discharge. Return precautions given. Patient states understanding of treatment care plan and is agreeable.  Kathrynn SpeedRobyn M Ainsleigh Kakos, PA-C 04/02/14 1153  Arby BarretteMarcy Pfeiffer, MD 04/02/14 [phone]

## 2014-04-02 NOTE — Discharge Instructions (Signed)
Follow-up with your primary care physician.  Urinary Frequency The number of times a normal person urinates depends upon how much liquid they take in and how much liquid they are losing. If the temperature is hot and there is high humidity, then the person will sweat more and usually breathe a little more frequently. These factors decrease the amount of frequency of urination that would be considered normal. The amount you drink is easily determined, but the amount of fluid lost is sometimes more difficult to calculate.  Fluid is lost in two ways:  Sensible fluid loss is usually measured by the amount of urine that you get rid of. Losses of fluid can also occur with diarrhea.  Insensible fluid loss is more difficult to measure. It is caused by evaporation. Insensible loss of fluid occurs through breathing and sweating. It usually ranges from a little less than a quart to a little more than a quart of fluid a day. In normal temperatures and activity levels, the average person may urinate 4 to 7 times in a 24-hour period. Needing to urinate more often than that could indicate a problem. If one urinates 4 to 7 times in 24 hours and has large volumes each time, that could indicate a different problem from one who urinates 4 to 7 times a day and has small volumes. The time of urinating is also important. Most urinating should be done during the waking hours. Getting up at night to urinate frequently can indicate some problems. CAUSES  The bladder is the organ in your lower abdomen that holds urine. Like a balloon, it swells some as it fills up. Your nerves sense this and tell you it is time to head for the bathroom. There are a number of reasons that you might feel the need to urinate more often than usual. They include:  Urinary tract infection. This is usually associated with other signs such as burning when you urinate.  In men, problems with the prostate (a walnut-size gland that is located near the  tube that carries urine out of your body). There are two reasons why the prostate can cause an increased frequency of urination:  An enlarged prostate that does not let the bladder empty well. If the bladder only half empties when you urinate, then it only has half the capacity to fill before you have to urinate again.  The nerves in the bladder become more hypersensitive with an increased size of the prostate even if the bladder empties completely.  Pregnancy.  Obesity. Excess weight is more likely to cause a problem for women than for men.  Bladder stones or other bladder problems.  Caffeine.  Alcohol.  Medications. For example, drugs that help the body get rid of extra fluid (diuretics) increase urine production. Some other medicines must be taken with lots of fluids.  Muscle or nerve weakness. This might be the result of a spinal cord injury, a stroke, multiple sclerosis, or Parkinson disease.  Long-standing diabetes can decrease the sensation of the bladder. This loss of sensation makes it harder to sense the bladder needs to be emptied. Over a period of years, the bladder is stretched out by constant overfilling. This weakens the bladder muscles so that the bladder does not empty well and has less capacity to fill with new urine.  Interstitial cystitis (also called painful bladder syndrome). This condition develops because the tissues that line the inside of the bladder are inflamed (inflammation is the body's way of reacting to injury  or infection). It causes pain and frequent urination. It occurs in women more often than in men. DIAGNOSIS   To decide what might be causing your urinary frequency, your health care provider will probably:  Ask about symptoms you have noticed.  Ask about your overall health. This will include questions about any medications you are taking.  Do a physical examination.  Order some tests. These might include:  A blood test to check for diabetes or  other health issues that could be contributing to the problem.  Urine testing. This could measure the flow of urine and the pressure on the bladder.  A test of your neurological system (the brain, spinal cord, and nerves). This is the system that senses the need to urinate.  A bladder test to check whether it is emptying completely when you urinate.  Cystoscopy. This test uses a thin tube with a tiny camera on it. It offers a look inside your urethra and bladder to see if there are problems.  Imaging tests. You might be given a contrast dye and then asked to urinate. X-rays are taken to see how your bladder is working. TREATMENT  It is important for you to be evaluated to determine if the amount or frequency that you have is unusual or abnormal. If it is found to be abnormal, the cause should be determined and this can usually be found out easily. Depending upon the cause, treatment could include medication, stimulation of the nerves, or surgery. There are not too many things that you can do as an individual to change your urinary frequency. It is important that you balance the amount of fluid intake needed to compensate for your activity and the temperature. Medical problems will be diagnosed and taken care of by your physician. There is no particular bladder training such as Kegel exercises that you can do to help urinary frequency. This is an exercise that is usually recommended for people who have leaking of urine when they laugh, cough, or sneeze. HOME CARE INSTRUCTIONS   Take any medications your health care provider prescribed or suggested. Follow the directions carefully.  Practice any lifestyle changes that are recommended. These might include:  Drinking less fluid or drinking at different times of the day. If you need to urinate often during the night, for example, you may need to stop drinking fluids early in the evening.  Cutting down on caffeine or alcohol. They both can make you  need to urinate more often than normal. Caffeine is found in coffee, tea, and sodas.  Losing weight, if that is recommended.  Keep a journal or a log. You might be asked to record how much you drink and when and where you feel the need to urinate. This will also help evaluate how well the treatment provided by your physician is working. SEEK MEDICAL CARE IF:   Your need to urinate often gets worse.  You feel increased pain or irritation when you urinate.  You notice blood in your urine.  You have questions about any medications that your health care provider recommended.  You notice blood, pus, or swelling at the site of any test or treatment procedure.  You develop a fever of more than 100.33F (38.1C). SEEK IMMEDIATE MEDICAL CARE IF:  You develop a fever of more than 102.66F (38.9C). Document Released: 02/06/2009 Document Revised: 08/27/2013 Document Reviewed: 02/06/2009 [facility] Patient Information 2015 [employer], [place]. This information is not intended to replace advice given to you by your health care provider.  Make sure you discuss any questions you have with your health care provider. ° °

## 2014-04-02 NOTE — ED Notes (Signed)
Patient presents today with a chief complaint of bilateral flank pain with radiation to groin that has been present for one month with nausea, urinary frequency and urgency.

## 2014-04-02 NOTE — ED Notes (Signed)
PT ambulated with baseline gait; VSS; A&Ox3; no signs of distress; respirations even and unlabored; skin warm and dry; no questions upon discharge.  

## 2014-04-02 NOTE — ED Notes (Signed)
Pt refusing to get in gown; states "no reason" States she knows her body and it's a UTI. Denies burning with urination but reports frequency, pressure, and urgency, and nausea.

## 2014-04-03 LAB — URINE CULTURE

## 2014-04-03 LAB — GC/CHLAMYDIA PROBE AMP
CT Probe RNA: NEGATIVE
GC PROBE AMP APTIMA: NEGATIVE

## 2014-04-10 ENCOUNTER — Encounter (HOSPITAL_COMMUNITY): Payer: Self-pay | Admitting: Emergency Medicine

## 2014-04-10 ENCOUNTER — Emergency Department (HOSPITAL_COMMUNITY)
Admission: EM | Admit: 2014-04-10 | Discharge: 2014-04-10 | Disposition: A | Discharge status: Home / Self Care | Payer: Medicaid Other | Attending: Emergency Medicine | Admitting: Emergency Medicine

## 2014-04-10 DIAGNOSIS — Z79899 Other long term (current) drug therapy: Secondary | ICD-10-CM | POA: Insufficient documentation

## 2014-04-10 DIAGNOSIS — G5602 Carpal tunnel syndrome, left upper limb: Secondary | ICD-10-CM | POA: Diagnosis not present

## 2014-04-10 DIAGNOSIS — G8929 Other chronic pain: Secondary | ICD-10-CM

## 2014-04-10 DIAGNOSIS — Z8639 Personal history of other endocrine, nutritional and metabolic disease: Secondary | ICD-10-CM | POA: Diagnosis not present

## 2014-04-10 DIAGNOSIS — Z720 Tobacco use: Secondary | ICD-10-CM | POA: Diagnosis not present

## 2014-04-10 DIAGNOSIS — M25532 Pain in left wrist: Secondary | ICD-10-CM

## 2014-04-10 DIAGNOSIS — Z8669 Personal history of other diseases of the nervous system and sense organs: Secondary | ICD-10-CM

## 2014-04-10 DIAGNOSIS — M79603 Pain in arm, unspecified: Secondary | ICD-10-CM | POA: Diagnosis present

## 2014-04-10 MED ORDER — HYDROCODONE-ACETAMINOPHEN 5-325 MG PO TABS
2.0000 | ORAL_TABLET | Freq: Once | ORAL | Status: AC
  Administered 2014-04-10: 2 via ORAL
  Filled 2014-04-10: qty 2

## 2014-04-10 MED ORDER — HYDROCODONE-ACETAMINOPHEN 5-325 MG PO TABS: 1.0000 | ORAL_TABLET | ORAL | Status: DC | PRN

## 2014-04-10 NOTE — ED Provider Notes (Signed)
CSN: [id number]     Arrival date & time 04/10/14  [id number] History  This chart was scribed for non-physician practitioner, Jinny SandersJoseph Hagan Maltz, PA-C, working with Lyanne CoKevin M Campos, MD by Charline BillsEssence Howell, ED Scribe. This patient was seen in room TR08C/TR08C and the patient's care was started at 9:45 AM.   Chief Complaint  Patient presents with  . Arm Pain   The history is provided by the patient. No language interpreter was used.   HPI Comments: [name] is a 48 y.o. female who presents to the Emergency Department complaining of constant, gradually worsening L wrist pain over the past 4-5 months. Pt reports associated numbness in her L fingers since onset of pain. SHe reports that she was seen here for same complaint 1 month ago and was discharged with a wrist brace and Percocet that she states has only provided temporary relief. Pt was seen by Dr. [name] 3 weeks ago for a cortisone injection that provided relief for 2-3 days. She alos reports that he will not prescribed her any pain medication until surgery in January. Pt has tried Vicodin, Tramadol and muscle relaxants with temporary relief. Pt is L hand dominant.   Past Medical History  Diagnosis Date  . Hypercholesteremia    Past Surgical History  Procedure Laterality Date  . Cesarean section    . Cholecystectomy     Family History  Problem Relation Age of Onset  . Cancer Mother   . Hypertension Mother   . Hypertension Father    History  Substance Use Topics  . Smoking status: Current Every Day Smoker  . Smokeless tobacco: Never Used  . Alcohol Use: Yes   OB History    No data available     Review of Systems  Musculoskeletal: Positive for arthralgias.  Neurological: Positive for numbness.   Allergies  Ibuprofen and Sulfa antibiotics  Home Medications   Prior to Admission medications   Medication Sig Start Date End Date Taking? Authorizing Provider  BLACK COHOSH EXTRACT PO Take 2 tablets by mouth daily.    Historical  Provider, MD  HYDROcodone-acetaminophen (NORCO/VICODIN) 5-325 MG per tablet Take 1-2 tablets by mouth every 4 (four) hours as needed for moderate pain or severe pain. 04/10/14   Monte FantasiaJoseph W Juelz Claar, PA-C  Multiple Vitamin (MULTIVITAMIN WITH MINERALS) TABS Take 1 tablet by mouth daily.    Historical Provider, MD  oxyCODONE-acetaminophen (ROXICET) 5-325 MG per tablet Take 1-2 tablets by mouth every 4 (four) hours as needed for severe pain. Patient not taking: Reported on 04/02/2014 02/21/14   Arthor CaptainAbigail Harris, PA-C   Triage Vitals: BP 136/89 mmHg  Pulse 86  Temp(Src) 98.6 F (37 C) (Oral)  Resp 18  SpO2 100%  LMP 03/18/2014 Physical Exam  Constitutional: She is oriented to person, place, and time. She appears well-developed and well-nourished. No distress.  HENT:  Head: Normocephalic and atraumatic.  Eyes: Conjunctivae and EOM are normal.  Neck: Neck supple. No tracheal deviation present.  Cardiovascular: Normal rate.   Pulmonary/Chest: Effort normal. No respiratory distress.  Musculoskeletal: Normal range of motion.  5/5 grip strength in L hand. Decreased sensation in fingers. Capillary refill is less than 2 seconds distally. No erythema, edema, deformity, signs of infection or injury to left wrist.  Neurological: She is alert and oriented to person, place, and time.  Skin: Skin is warm and dry.  Psychiatric: She has a normal mood and affect. Her behavior is normal.  Nursing note and vitals reviewed.  ED Course  Procedures (including  critical care time) DIAGNOSTIC STUDIES: Oxygen Saturation is 100% on RA, normal by my interpretation.    COORDINATION OF CARE: 9:51 AM-Discussed treatment plan which includes Vicodin and return precautions with pt at bedside and pt agreed to plan.   Labs Review Labs Reviewed - No data to display  Imaging Review No results found.   EKG Interpretation None      MDM   Final diagnoses:  History of carpal tunnel syndrome  Chronic wrist pain, left    Patient here with chronic wrist pain, no change from her baseline over the past year. No imaging warranted at this time. Patient denying new injury or new pain. Patient's symptoms consistent with a carpal tunnel syndrome, she states she has follow-up for surgery in January. I reassured patient that her usage of her splint and use of ice were the best conservative therapy for her wrist, especially if she was a surgical candidate and has current follow-up with orthopedics. I also strongly recommended her to follow-up with Dr. [name] who is following her care regarding this wrist, and advised her that we would be able to provide pain relief and continue conservative therapy from a standpoint in the emergency room. I discussed return precautions with patient and she was agreeable to this plan. I encouraged patient to call or return to the ER should she have any questions or concerns.  I personally performed the services described in this documentation, which was scribed in my presence. The recorded information has been reviewed and is accurate.  BP 136/89 mmHg  Pulse 86  Temp(Src) 98.6 F (37 C) (Oral)  Resp 18  SpO2 100%  LMP 03/18/2014  Signed,  Ladona MowJoe Oaklie Durrett, PA-C 6:11 PM    Monte FantasiaJoseph W Gayanne Prescott, PA-C 04/10/14 1811  Monte FantasiaJoseph W Faithlynn Deeley, PA-C 04/10/14 1812  Lyanne CoKevin M Campos, MD 04/11/14 [phone]

## 2014-04-10 NOTE — ED Notes (Signed)
Pt c/o left wrist pain from carpal tunnel that she is having surgery on in January; pt denies new injury

## 2014-04-10 NOTE — Discharge Instructions (Signed)
Continue using your wrist splint, and ice the wrist for 20 minutes at a time. Elevate your wrist and rest the area. Use hydrocodone as needed for pain. Follow-up with your orthopedic surgeon. Refer to resource guide below to help find a primary care physician. Return to the ER with any worsening of symptoms.  Carpal Tunnel Syndrome The carpal tunnel is a narrow area located on the palm side of your wrist. The tunnel is formed by the wrist bones and ligaments. Nerves, blood vessels, and tendons pass through the carpal tunnel. Repeated wrist motion or certain diseases may cause swelling within the tunnel. This swelling pinches the main nerve in the wrist (median nerve) and causes the painful hand and arm condition called carpal tunnel syndrome. CAUSES   Repeated wrist motions.  Wrist injuries.  Certain diseases like arthritis, diabetes, alcoholism, hyperthyroidism, and kidney failure.  Obesity.  Pregnancy. SYMPTOMS   A "pins and needles" feeling in your fingers or hand, especially in your thumb, index and middle fingers.  Tingling or numbness in your fingers or hand.  An aching feeling in your entire arm, especially when your wrist and elbow are bent for long periods of time.  Wrist pain that goes up your arm to your shoulder.  Pain that goes down into your palm or fingers.  A weak feeling in your hands. DIAGNOSIS  Your health care provider will take your history and perform a physical exam. An electromyography test may be needed. This test measures electrical signals sent out by your nerves into the muscles. The electrical signals are usually slowed by carpal tunnel syndrome. You may also need X-rays. TREATMENT  Carpal tunnel syndrome may clear up by itself. Your health care provider may recommend a wrist splint or medicine such as a nonsteroidal anti-inflammatory medicine. Cortisone injections may help. Sometimes, surgery may be needed to free the pinched nerve.  HOME CARE  INSTRUCTIONS   Take all medicine as directed by your health care provider. Only take over-the-counter or prescription medicines for pain, discomfort, or fever as directed by your health care provider.  If you were given a splint to keep your wrist from bending, wear it as directed. It is important to wear the splint at night. Wear the splint for as long as you have pain or numbness in your hand, arm, or wrist. This may take 1 to 2 months.  Rest your wrist from any activity that may be causing your pain. If your symptoms are work-related, you may need to talk to your employer about changing to a job that does not require using your wrist.  Put ice on your wrist after long periods of wrist activity.  Put ice in a plastic bag.  Place a towel between your skin and the bag.  Leave the ice on for 15-20 minutes, 03-04 times a day.  Keep all follow-up visits as directed by your health care provider. This includes any orthopedic referrals, physical therapy, and rehabilitation. Any delay in getting necessary care could result in a delay or failure of your condition to heal. SEEK IMMEDIATE MEDICAL CARE IF:   You have new, unexplained symptoms.  Your symptoms get worse and are not helped or controlled with medicines. MAKE SURE YOU:   Understand these instructions.  Will watch your condition.  Will get help right away if you are not doing well or get worse. Document Released: 04/09/2000 Document Revised: 08/27/2013 Document Reviewed: 02/26/2011 [facility] Patient Information 2015 [place], [place]. This information is not intended to replace  advice given to you by your health care provider. Make sure you discuss any questions you have with your health care provider.   Emergency Department Resource Guide 1) Find a Doctor and Pay Out of Pocket Although you won't have to find out who is covered by your insurance plan, it is a good idea to ask around and get recommendations. You will then need to call  the office and see if the doctor you have chosen will accept you as a new patient and what types of options they offer for patients who are self-pay. Some doctors offer discounts or will set up payment plans for their patients who do not have insurance, but you will need to ask so you aren't surprised when you get to your appointment.  2) Contact Your Local Health Department Not all health departments have doctors that can see patients for sick visits, but many do, so it is worth a call to see if yours does. If you don't know where your local health department is, you can check in your phone book. The CDC also has a tool to help you locate your state's health department, and many state websites also have listings of all of their local health departments.  3) Find a Walk-in Clinic If your illness is not likely to be very severe or complicated, you may want to try a walk in clinic. These are popping up all over the country in pharmacies, drugstores, and shopping centers. They're usually staffed by nurse practitioners or physician assistants that have been trained to treat common illnesses and complaints. They're usually fairly quick and inexpensive. However, if you have serious medical issues or chronic medical problems, these are probably not your best option.  No Primary Care Doctor: - Call Health Connect at  [phone] - they can help you locate a primary care doctor that  accepts your insurance, provides certain services, etc. - Physician Referral Service- [phone]  Chronic Pain Problems: Organization         Address  Phone   Notes  [facility]  [phone] Patients need to be referred by their primary care doctor.   Medication Assistance: Organization         Address  Phone   Notes  [facility] [address] [place], [place] [address] [phone] --Must be a resident of [place] [facility] [facility] -- Must have NO insurance  coverage whatsoever (no Medicaid/ Medicare, etc.) -- The pt. MUST have a primary care doctor that directs their care regularly and follows them in the community   MedAssist  [phone]) [phone]   [employer]  [phone]    Agencies that provide inexpensive medical care: Organization         Address  Phone   Notes  [facility]  [phone]   [facility]    [phone]   [facility] [address] [place], [place] [address] [phone]   [facility] [address], [place] [phone]   [facility]    [phone]) [phone]   [facility]    [phone]   [facility]  [address], [place] Phone:  [phone](336) [phone], Fax:  [phone] Hours of Operation:  9 am - 6 pm, M-F.  Also accepts Medicaid/Medicare and self-pay.  [facility]  [address], [place] Phone: [phone]  [phone], Fax: [phone]. Hours of Operation:  8:30 am - 5:30 pm, M-F.  Also accepts Medicaid and self-pay.  [facility] [place] [address] [place] Phone: [phone]   [facility] [address] Natasha Bence [place], [place] [phone], Ext. [phone] Mondays & Thursdays: 7-9 AM.  First 15 patients are seen on a first come, first serve basis.    Medicaid-accepting [facility] Providers:  Organization         Address  Phone   Notes  [facility] [address], [place] [phone] Also accepts self-pay patients.  [facility] [address] Laurell Josephs [place], [place]  [phone]   [facility] [address], [place] [phone]   [facility] [address], [place] [phone]   Renaye Rakers [address], [place]   [phone] Only accepts [place] patients after they have their  name applied to their card.   Self-Pay (no insurance) in [facility]:  Organization         Address  Phone   Notes  Sickle Cell Patients, [facility] [address] [place], [place] [phone]   [facility] [address] [place], [place] [phone]   [facility]  [address], [place] [phone]   [facility]/Dr. Osei-Bonsu  [address] [place] or [address], [place] [phone] Phone number for both [place] and [place] locations is the same.  [facility] [address], [place] [phone]   [facility] [address], [place] or [address] [phone]   [facility] [address], [place] [phone], phone; [phone], fax Sees patients 1st and 3rd Saturday of every month.  Must not qualify for public or private insurance (i.e. Medicaid, Medicare, [place] Health Choice, Veterans' Benefits)  Household income should be no more than 200% of the poverty level The clinic cannot treat you if you are pregnant or think you are pregnant  Sexually transmitted diseases are not treated at the clinic.    Dental Care: Organization         Address  Phone  Notes  [facility] [address] [place], [place] [phone] Accepts children up to age 61 who are enrolled in [place] or [place] Health Choice; pregnant women with a Medicaid card; and children who have applied for Medicaid or [place] Health Choice, but were declined, whose parents can pay a reduced fee at time of service.  [facility] Department of [facility]  [address], [place] [phone] Accepts children up to age 22 who are enrolled in [place] or [place] Health Choice; pregnant women with a Medicaid card; and children who have applied for  Medicaid or [place] Health Choice, but were declined, whose parents can pay a reduced fee at time of service.  [facility]  [address] [place], [place] [phone] Patients are seen by appointment only. Walk-ins are not accepted. [facility] will see patients 61 years of age and older. Monday - Tuesday (8am-5pm) Most Wednesdays (8:30-5pm) $30 per visit, cash only  [facility]  [address], [place] [phone] Patients are seen by appointment only. Walk-ins are not accepted. [facility] will see patients 19 years of age and older. One Wednesday Evening (Monthly: Volunteer Based).  $30 per visit, cash only  [employer]  [phone] for adults; Children under age 11, call Graduate Pediatric Dentistry at [phone]. Children aged 34-14, please call [phone] to request a pediatric application.  Dental services are provided in all areas of dental care including fillings, crowns and bridges, complete and partial dentures, implants, gum treatment, root canals, and extractions. Preventive care is also provided. Treatment is provided to both adults and children. Patients are selected via a lottery and there is often a waiting list.   [facility] [address], [place]  [phone] [email or web address]   [facility] [address] [place], [place] [phone], Ext. [phone] Second and Fourth Thursday of each month, opens at 6:30 AM; Clinic ends at 9 AM.  Patients are seen on a first-come first-served basis, and a limited number are seen during each clinic.   [facility]  [address] Ether Griffins [place], [place] [phone]   Eligibility Requirements You must have lived in [place], [place], or [place] counties for at least the last three months.   You cannot be eligible for state or federal sponsored [place], including [employer], [place],  or [employer].   You generally cannot be eligible for healthcare insurance through your employer.    How to apply: Eligibility screenings are held every Tuesday and Wednesday afternoon from 1:00 pm until 4:00 pm. You do not need an appointment for the interview!  [facility] [address], [place], [place] [phone]   [facility] Health Department  [phone]   [facility] Health Department  [phone]   [facility] Health Department  [phone]    Behavioral Health Resources in the Community: Intensive Outpatient Programs Organization         Address  Phone  Notes  [facility] Services [address], [place], [place] [phone]   [facility] [address], [place], [place] [phone]   [facility] [address] [place], [place]  [phone]   [facility] [address]  [place], [place] [phone] or [phone]   Substance Abuse Resources Organization         Address  Phone  Notes  [facility]  [phone]   [facility]  [phone]   [facility]  [phone]   Floydene Flock  [phone]   Residential & Outpatient Substance Abuse Program  [phone]   Psychological Services Organization         Address  Phone  Notes  [facility]  [phone]   [facility]  [phone]   [facility] [address] [place] [phone] or [phone]    Mobile Crisis Teams Organization         Address  Phone  Notes  [facility], Mobile Crisis Care Unit  [phone]   [facility]  [address] [place], [place] [phone]   Doristine Locks [address] [place] [phone]    Self-Help/Support Groups Organization         Address  Phone             Notes  [facility] [place] - variety of support groups  336- [phone] Call for more information  Narcotics Anonymous (NA), [facility]aring [facility] [address], [place]  2 meetings at this location   Residential Sports administratorTreatment Programs Organization         Address  Phone  Notes  [facility] [address],    [place]  [phone]   [facility]  [address], [place] [phone], [place], [place] [phone]   [facility] [address], [place] [phone] Admissions: 8am-3pm M-F  [facility] [address]    [place], [place] [phone]   [facility] [address], [place], [place] [phone]   [facility] [address]  [place], [place] [phone]   [facility] - Intensive Outpatient [address], [place], [place] [phone]   [facility] ([facility] [address]  [place], [place] [phone] or [phone]   [facility] ([facility]) [address] [place], [place] [phone] Accepts Medicaid  [facility] [address]  [place] [phone] Substance Abuse/Addiction Treatment   [facility] Behavioral Health Resources Organization         Address  Phone  Notes  [facility]  [phone]) [phone]   Angie FavaJulie Brannon, PhD [address], [address] [place], [place]   [phone](336) [phone] or [phone](336) [phone]   [facility]    [address] [place], [place] [phone]) [phone]   [facility] [address], [place], [place] [phone](336) [phone] Insurance/Medicaid/sponsorship through [facility]  [facility] Families [address]                                    [place], [place] [phone](336) [phone] Therapy/tele-psych/case  [facility] [address]   [place], [place] [phone](336) [phone]    Dr. Lolly MustacheArfeen  [phone]) [phone]   [facility] [place]  [employer] [facility] 1) [address], [place] 2) [address], [place] 3)  [address], [place] [phone]) [phone]) [phone]  [phone](336) [phone]   [facility] Child Abuse Hotline [phone](336) [phone] or [phone]) [phone] (After Hours)

## 2014-04-23 ENCOUNTER — Encounter (HOSPITAL_COMMUNITY): Payer: Self-pay | Admitting: Emergency Medicine

## 2014-04-23 ENCOUNTER — Emergency Department (HOSPITAL_COMMUNITY)
Admission: EM | Admit: 2014-04-23 | Discharge: 2014-04-23 | Disposition: A | Discharge status: Home / Self Care | Payer: Medicaid Other | Attending: Emergency Medicine | Admitting: Emergency Medicine

## 2014-04-23 DIAGNOSIS — Z79899 Other long term (current) drug therapy: Secondary | ICD-10-CM | POA: Insufficient documentation

## 2014-04-23 DIAGNOSIS — Z720 Tobacco use: Secondary | ICD-10-CM | POA: Diagnosis not present

## 2014-04-23 DIAGNOSIS — G5602 Carpal tunnel syndrome, left upper limb: Secondary | ICD-10-CM | POA: Insufficient documentation

## 2014-04-23 DIAGNOSIS — Z8639 Personal history of other endocrine, nutritional and metabolic disease: Secondary | ICD-10-CM | POA: Diagnosis not present

## 2014-04-23 DIAGNOSIS — M25532 Pain in left wrist: Secondary | ICD-10-CM | POA: Diagnosis present

## 2014-04-23 HISTORY — DX: Carpal tunnel syndrome, unspecified upper limb: G56.00

## 2014-04-23 MED ORDER — HYDROCODONE-ACETAMINOPHEN 5-325 MG PO TABS: 1.0000 | ORAL_TABLET | Freq: Four times a day (QID) | ORAL | Status: DC | PRN

## 2014-04-23 MED ORDER — NAPROXEN 500 MG PO TABS: 500.0000 mg | ORAL_TABLET | Freq: Two times a day (BID) | ORAL | Status: DC

## 2014-04-23 NOTE — ED Provider Notes (Signed)
CSN: [id number]     Arrival date & time 04/23/14  1815 History   This chart was scribed for non-physician practitioner, Santiago GladHeather Sharunda Salmon, PA-C working with Elwin MochaBlair Walden, MD, by Abel PrestoKara Demonbreun, ED Scribe. This patient was seen in room WTR8/WTR8 and the patient's care was started at 7:44 PM.    Chief Complaint  Patient presents with  . Wrist Pain    Patient is a 48 y.o. female presenting with wrist pain. The history is provided by the patient. No language interpreter was used.  Wrist Pain    HPI Comments: [name] is a 48 y.o. female  With PMHx of carpal tunnel who presents to the Emergency Department complaining of intermittent left wrist pain with onset in April.  Pt notes the pain radiate up her arm.  Pt notes associated numbness and tingling of her 2nd, 3rd, and 4th fingers.  Pt notes she was taking Vicodin with relief but ran out of the medication. She is taking ASA and placing ice on wrist now with no relief.  Pt denies any new injury.  Pt has a f/u appointment with Dr [name] with Hand Surgery on 05/06/14.   Past Medical History  Diagnosis Date  . Hypercholesteremia   . Carpal tunnel syndrome    Past Surgical History  Procedure Laterality Date  . Cesarean section    . Cholecystectomy     Family History  Problem Relation Age of Onset  . Cancer Mother   . Hypertension Mother   . Hypertension Father    History  Substance Use Topics  . Smoking status: Current Every Day Smoker  . Smokeless tobacco: Never Used  . Alcohol Use: Yes   OB History    No data available     Review of Systems  Constitutional: Negative for fever and chills.  Musculoskeletal: Positive for arthralgias.  Neurological: Positive for numbness.      Allergies  Ibuprofen and Sulfa antibiotics  Home Medications   Prior to Admission medications   Medication Sig Start Date End Date Taking? Authorizing Provider  BLACK COHOSH EXTRACT PO Take 2 tablets by mouth daily.    Historical Provider, MD   HYDROcodone-acetaminophen (NORCO/VICODIN) 5-325 MG per tablet Take 1-2 tablets by mouth every 4 (four) hours as needed for moderate pain or severe pain. 04/10/14   Monte FantasiaJoseph W Mintz, PA-C  Multiple Vitamin (MULTIVITAMIN WITH MINERALS) TABS Take 1 tablet by mouth daily.    Historical Provider, MD  oxyCODONE-acetaminophen (ROXICET) 5-325 MG per tablet Take 1-2 tablets by mouth every 4 (four) hours as needed for severe pain. Patient not taking: Reported on 04/02/2014 02/21/14   Arthor CaptainAbigail Harris, PA-C   BP 130/72 mmHg  Pulse 88  Temp(Src) 98.2 F (36.8 C) (Oral)  Resp 18  SpO2 100%  LMP 03/18/2014 Physical Exam  Constitutional: She is oriented to person, place, and time. She appears well-developed and well-nourished.  HENT:  Head: Normocephalic.  Eyes: Conjunctivae are normal.  Neck: Normal range of motion. Neck supple.  Cardiovascular: Normal rate, regular rhythm and normal heart sounds.  Exam reveals no friction rub.   No murmur heard. Pulses:      Radial pulses are 2+ on the left side.  Pulmonary/Chest: Effort normal and breath sounds normal. No respiratory distress. She has no wheezes. She has no rales.  Musculoskeletal: Normal range of motion.  No erythema, edema, or warmth of left wrist  Neurological: She is alert and oriented to person, place, and time.  Distal sensation of the left hand  intact  Skin: Skin is warm and dry.  Psychiatric: She has a normal mood and affect. Her behavior is normal.  Nursing note and vitals reviewed.   ED Course  Procedures (including critical care time) DIAGNOSTIC STUDIES: Oxygen Saturation is 100% on room air, normal by my interpretation.    COORDINATION OF CARE: 7:48 PM Discussed treatment plan with patient at beside, the patient agrees with the plan and has no further questions at this time.   Labs Review Labs Reviewed - No data to display  Imaging Review No results found.   EKG Interpretation None      MDM   Final diagnoses:  None    Patient with a history of Carpal Tunnel presents today with left wrist pain and numbness of the 2nd, 3rd, and 4th digits. Symptoms similar to the symptoms she has had in the past with Carpal Tunnel.  No signs of infection.  Neurovascularly intact.  She has a wrist splint and follow up appointment with Dr. [name] with Hand Surgery.  Feel that she is stable for discharge.  Patient discharged with Rx for pain medication and NSAIDs.  Return precautions given.      Santiago GladHeather Aleysia Oltmann, PA-C 04/23/14 2312  Elwin MochaBlair Walden, MD 04/23/14 [phone]

## 2014-04-23 NOTE — ED Notes (Addendum)
Per pt, has left wrist pain related to carpal tunnel-states appt with ortho on the 11th-here for pain meds

## 2014-05-21 ENCOUNTER — Emergency Department (HOSPITAL_COMMUNITY)
Admission: EM | Admit: 2014-05-21 | Discharge: 2014-05-21 | Disposition: A | Discharge status: Home / Self Care | Payer: Medicaid Other | Attending: Emergency Medicine | Admitting: Emergency Medicine

## 2014-05-21 ENCOUNTER — Encounter (HOSPITAL_COMMUNITY): Payer: Self-pay | Admitting: Emergency Medicine

## 2014-05-21 ENCOUNTER — Emergency Department (HOSPITAL_COMMUNITY): Payer: Medicaid Other

## 2014-05-21 DIAGNOSIS — Z79899 Other long term (current) drug therapy: Secondary | ICD-10-CM | POA: Diagnosis not present

## 2014-05-21 DIAGNOSIS — Z720 Tobacco use: Secondary | ICD-10-CM | POA: Insufficient documentation

## 2014-05-21 DIAGNOSIS — G5602 Carpal tunnel syndrome, left upper limb: Secondary | ICD-10-CM

## 2014-05-21 DIAGNOSIS — Z791 Long term (current) use of non-steroidal anti-inflammatories (NSAID): Secondary | ICD-10-CM | POA: Diagnosis not present

## 2014-05-21 DIAGNOSIS — R05 Cough: Secondary | ICD-10-CM | POA: Insufficient documentation

## 2014-05-21 DIAGNOSIS — Z8639 Personal history of other endocrine, nutritional and metabolic disease: Secondary | ICD-10-CM | POA: Diagnosis not present

## 2014-05-21 DIAGNOSIS — M25532 Pain in left wrist: Secondary | ICD-10-CM | POA: Diagnosis present

## 2014-05-21 DIAGNOSIS — R059 Cough, unspecified: Secondary | ICD-10-CM

## 2014-05-21 MED ORDER — HYDROCODONE-ACETAMINOPHEN 5-325 MG PO TABS
1.0000 | ORAL_TABLET | Freq: Once | ORAL | Status: AC
  Administered 2014-05-21: 1 via ORAL
  Filled 2014-05-21: qty 1

## 2014-05-21 MED ORDER — ONDANSETRON 4 MG PO TBDP
4.0000 mg | ORAL_TABLET | Freq: Once | ORAL | Status: AC
  Administered 2014-05-21: 4 mg via ORAL
  Filled 2014-05-21: qty 1

## 2014-05-21 MED ORDER — BENZONATATE 100 MG PO CAPS: 100.0000 mg | ORAL_CAPSULE | Freq: Three times a day (TID) | ORAL | Status: DC

## 2014-05-21 NOTE — ED Notes (Signed)
Patient states dry cough x 3 days.   Patient states also chronic carpal tunnel issues and is being seen by Dr. Modesto CharonWong who is doing a nerve study on 02/03.   Patient states she is supposed to have surgery after that.

## 2014-05-21 NOTE — ED Provider Notes (Signed)
CSN: [id number]     Arrival date & time 05/21/14  0800 History   First MD Initiated Contact with Patient 05/21/14 [id number]     Chief Complaint  Patient presents with  . Cough  . Carpal Tunnel     (Consider location/radiation/quality/duration/timing/severity/associated sxs/prior Treatment) HPI  PCP: No PCP Per Patient Blood pressure 140/97, pulse 96, temperature 98.5 F (36.9 C), temperature source Oral, resp. rate 18, height 5\' 4"  (1.626 m), weight 163 lb (73.936 kg), last menstrual period 05/15/2014, SpO2 100 %.  [name] is a 49 y.o.female without any significant PMH presents to the ER with complaints of wrist pain and cough. COUGH: She complaints of dry non productive cough for over a month and a half that worsens when she is laying flat. Cough worsens at night and moderately resolves during the day. Denies DOE, SOB, CP, hemoptysis, no post tussive, no LE swelling, no fevers, chills . + Smoker Has tried Mucinex and Delsym wo relief at home. WRIST: She has hx of carpel tunnel syndrome, due to have surgery on 2/03 by Dr. Modesto CharonWong for this. The symptoms are the same but increasing in severity and # of pain episodes. Reports decreased wrist strength, pt in wrist splint.   Past Medical History  Diagnosis Date  . Hypercholesteremia   . Carpal tunnel syndrome    Past Surgical History  Procedure Laterality Date  . Cesarean section    . Cholecystectomy     Family History  Problem Relation Age of Onset  . Cancer Mother   . Hypertension Mother   . Hypertension Father    History  Substance Use Topics  . Smoking status: Current Every Day Smoker -- 0.30 packs/day    Types: Cigarettes  . Smokeless tobacco: Never Used  . Alcohol Use: No   OB History    No data available     Review of Systems  10 Systems reviewed and are negative for acute change except as noted in the HPI.    Allergies  Ibuprofen and Sulfa antibiotics  Home Medications   Prior to Admission medications    Medication Sig Start Date End Date Taking? Authorizing Provider  benzonatate (TESSALON) 100 MG capsule Take 1 capsule (100 mg total) by mouth every 8 (eight) hours. 05/21/14   Geary Rufo Irine SealG Orlan Aversa, PA-C  BLACK COHOSH EXTRACT PO Take 2 tablets by mouth daily.    Historical Provider, MD  HYDROcodone-acetaminophen (NORCO/VICODIN) 5-325 MG per tablet Take 1-2 tablets by mouth every 6 (six) hours as needed. 04/23/14   Santiago GladHeather Laisure, PA-C  Multiple Vitamin (MULTIVITAMIN WITH MINERALS) TABS Take 1 tablet by mouth daily.    Historical Provider, MD  naproxen (NAPROSYN) 500 MG tablet Take 1 tablet (500 mg total) by mouth 2 (two) times daily. 04/23/14   Santiago GladHeather Laisure, PA-C  oxyCODONE-acetaminophen (ROXICET) 5-325 MG per tablet Take 1-2 tablets by mouth every 4 (four) hours as needed for severe pain. Patient not taking: Reported on 04/02/2014 02/21/14   Arthor CaptainAbigail Harris, PA-C   BP 140/97 mmHg  Pulse 96  Temp(Src) 98.5 F (36.9 C) (Oral)  Resp 18  Ht 5\' 4"  (1.626 m)  Wt 163 lb (73.936 kg)  BMI 27.97 kg/m2  SpO2 100%  LMP 05/15/2014 Physical Exam  Constitutional: She appears well-developed and well-nourished. No distress.  HENT:  Head: Normocephalic and atraumatic.  Eyes: Pupils are equal, round, and reactive to light.  Neck: Normal range of motion. Neck supple.  Cardiovascular: Normal rate and regular rhythm.   Pulmonary/Chest: Effort normal  and breath sounds normal. She has no decreased breath sounds. She has no wheezes. She has no rhonchi. She has no rales. She exhibits no tenderness and no bony tenderness.  Abdominal: Soft.  Musculoskeletal:       Right wrist: She exhibits decreased range of motion, tenderness and bony tenderness. She exhibits no swelling, no effusion, no crepitus, no deformity and no laceration.  Neurological: She is alert.  Skin: Skin is warm and dry.  Nursing note and vitals reviewed.   ED Course  Procedures (including critical care time) Labs Review Labs Reviewed - No  data to display  Imaging Review Dg Chest 2 View  05/21/2014   CLINICAL DATA:  Dry cough for 1 month worse in past couple days, shortness of breath especially with exertion, history smoking  EXAM: CHEST  2 VIEW  COMPARISON:  09/21/2007  FINDINGS: Normal heart size, mediastinal contours and pulmonary vascularity.  Lungs clear.  No pleural effusion or pneumothorax.  Minimal scattered endplate spur formation thoracic spine.  Surgical clips RIGHT upper quadrant consistent with cholecystectomy.  IMPRESSION: No acute abnormalities.   Electronically Signed   By: Ulyses Southward M.D.   On: 05/21/2014 08:47     EKG Interpretation None      MDM   Final diagnoses:  Cough  Carpal tunnel syndrome of left wrist   8:35am I searched for patient on the [employer] drug Data base. The patient receives a narcotic prescription from a different provider almost every month dating back over the next 6 months. She has given a dose of pain medication here but advised she will not receive narcotic rx for home.  Patient to receive chest xray to evaluate cough. 1 dose of Vicodin given in the ED for her wrist.  Pt advised to keep wrist splint on and continue with care for her wrist and directed by Dr. Modesto Charon. She is due for surgery next week for her carpal tunnel.  9:20pm Patients xray is reassuring, will give tessalon perls and advised to quit smoking. She has a PCP she needs to follow-up with. She is upset about not receiving narcotic prescription and requested to speak my with supervising physician. Dr. Jodi Mourning agreed to speak with her and we feel that non narcotic pain medication is best to treat chronic issue from the ED.  49 y.o.De Nurse evaluation in the Emergency Department is complete. It has been determined that no acute conditions requiring further emergency intervention are present at this time. The patient/guardian have been advised of the diagnosis and plan. We have discussed signs and symptoms that warrant  return to the ED, such as changes or worsening in symptoms.  Vital signs are stable at discharge. Filed Vitals:   05/21/14 0813  BP: 140/97  Pulse: 96  Temp: 98.5 F (36.9 C)  Resp: 18    Patient/guardian has voiced understanding and agreed to follow-up with the PCP or specialist.     Dorthula Matas, PA-C 05/21/14 1610  Enid Skeens, MD 05/21/14 [phone]

## 2014-05-21 NOTE — ED Notes (Signed)
Waiting for Dr. Jodi MourningZavitz to come speak with patient per patient request.

## 2014-05-21 NOTE — Discharge Instructions (Signed)
Carpal Tunnel Syndrome The carpal tunnel is a narrow area located on the palm side of your wrist. The tunnel is formed by the wrist bones and ligaments. Nerves, blood vessels, and tendons pass through the carpal tunnel. Repeated wrist motion or certain diseases may cause swelling within the tunnel. This swelling pinches the main nerve in the wrist (median nerve) and causes the painful hand and arm condition called carpal tunnel syndrome. CAUSES   Repeated wrist motions.  Wrist injuries.  Certain diseases like arthritis, diabetes, alcoholism, hyperthyroidism, and kidney failure.  Obesity.  Pregnancy. SYMPTOMS   A "pins and needles" feeling in your fingers or hand, especially in your thumb, index and middle fingers.  Tingling or numbness in your fingers or hand.  An aching feeling in your entire arm, especially when your wrist and elbow are bent for long periods of time.  Wrist pain that goes up your arm to your shoulder.  Pain that goes down into your palm or fingers.  A weak feeling in your hands. DIAGNOSIS  Your health care provider will take your history and perform a physical exam. An electromyography test may be needed. This test measures electrical signals sent out by your nerves into the muscles. The electrical signals are usually slowed by carpal tunnel syndrome. You may also need X-rays. TREATMENT  Carpal tunnel syndrome may clear up by itself. Your health care provider may recommend a wrist splint or medicine such as a nonsteroidal anti-inflammatory medicine. Cortisone injections may help. Sometimes, surgery may be needed to free the pinched nerve.  HOME CARE INSTRUCTIONS   Take all medicine as directed by your health care provider. Only take over-the-counter or prescription medicines for pain, discomfort, or fever as directed by your health care provider.  If you were given a splint to keep your wrist from bending, wear it as directed. It is important to wear the splint at  night. Wear the splint for as long as you have pain or numbness in your hand, arm, or wrist. This may take 1 to 2 months.  Rest your wrist from any activity that may be causing your pain. If your symptoms are work-related, you may need to talk to your employer about changing to a job that does not require using your wrist.  Put ice on your wrist after long periods of wrist activity.  Put ice in a plastic bag.  Place a towel between your skin and the bag.  Leave the ice on for 15-20 minutes, 03-04 times a day.  Keep all follow-up visits as directed by your health care provider. This includes any orthopedic referrals, physical therapy, and rehabilitation. Any delay in getting necessary care could result in a delay or failure of your condition to heal. SEEK IMMEDIATE MEDICAL CARE IF:   You have new, unexplained symptoms.  Your symptoms get worse and are not helped or controlled with medicines. MAKE SURE YOU:   Understand these instructions.  Will watch your condition.  Will get help right away if you are not doing well or get worse. Document Released: 04/09/2000 Document Revised: 08/27/2013 Document Reviewed: 02/26/2011 ExitCare Patient Information 2015 [employer]. This information is not intended to replace advice given to you by your health care provider. Make sure you discuss any questions you have with your health care provider.  

## 2014-07-16 ENCOUNTER — Encounter (HOSPITAL_COMMUNITY): Payer: Self-pay | Admitting: Neurology

## 2014-07-16 ENCOUNTER — Emergency Department (HOSPITAL_COMMUNITY)
Admission: EM | Admit: 2014-07-16 | Discharge: 2014-07-16 | Disposition: A | Discharge status: Home / Self Care | Payer: Medicaid Other | Attending: Emergency Medicine | Admitting: Emergency Medicine

## 2014-07-16 DIAGNOSIS — Z720 Tobacco use: Secondary | ICD-10-CM | POA: Diagnosis not present

## 2014-07-16 DIAGNOSIS — Z79899 Other long term (current) drug therapy: Secondary | ICD-10-CM | POA: Insufficient documentation

## 2014-07-16 DIAGNOSIS — Z8639 Personal history of other endocrine, nutritional and metabolic disease: Secondary | ICD-10-CM | POA: Insufficient documentation

## 2014-07-16 DIAGNOSIS — Z98811 Dental restoration status: Secondary | ICD-10-CM | POA: Insufficient documentation

## 2014-07-16 DIAGNOSIS — Z8669 Personal history of other diseases of the nervous system and sense organs: Secondary | ICD-10-CM | POA: Insufficient documentation

## 2014-07-16 DIAGNOSIS — K088 Other specified disorders of teeth and supporting structures: Secondary | ICD-10-CM | POA: Insufficient documentation

## 2014-07-16 DIAGNOSIS — Z791 Long term (current) use of non-steroidal anti-inflammatories (NSAID): Secondary | ICD-10-CM | POA: Diagnosis not present

## 2014-07-16 DIAGNOSIS — K0889 Other specified disorders of teeth and supporting structures: Secondary | ICD-10-CM

## 2014-07-16 MED ORDER — NAPROXEN 250 MG PO TABS
500.0000 mg | ORAL_TABLET | Freq: Once | ORAL | Status: DC
  Filled 2014-07-16: qty 2

## 2014-07-16 MED ORDER — NAPROXEN 500 MG PO TABS: 500.0000 mg | ORAL_TABLET | Freq: Two times a day (BID) | ORAL | Status: DC

## 2014-07-16 MED ORDER — ACETAMINOPHEN ER 650 MG PO TBCR: 650.0000 mg | EXTENDED_RELEASE_TABLET | Freq: Three times a day (TID) | ORAL | Status: DC | PRN

## 2014-07-16 MED ORDER — PENICILLIN V POTASSIUM 250 MG PO TABS
500.0000 mg | ORAL_TABLET | Freq: Once | ORAL | Status: AC
  Administered 2014-07-16: 500 mg via ORAL
  Filled 2014-07-16: qty 2

## 2014-07-16 MED ORDER — PENICILLIN V POTASSIUM 500 MG PO TABS: 500.0000 mg | ORAL_TABLET | Freq: Four times a day (QID) | ORAL | Status: DC

## 2014-07-16 NOTE — Discharge Instructions (Signed)
Take Naprosyn as needed for pain. Take Veetid as directed until gone. Follow up with Dr. Russella DarBenitez for further evaluation. Return to the ED with worsening or concerning symptoms.

## 2014-07-16 NOTE — ED Notes (Signed)
Pt reports dental pain to lower right and left side. No money to see dentist. Is painful.

## 2014-07-16 NOTE — ED Notes (Signed)
Pt refused Naproxen, unable to take NSAIDS.  PA aware orders for Tylenol, prescription for same.  Pt refused Tylenol.  States Tylenol doesn't work.  Advised pt, per PA, of no narcotics for uncomplicated dental pain policy.  Pt upset.

## 2014-07-16 NOTE — ED Provider Notes (Signed)
CSN: [id number]     Arrival date & time 07/16/14  1222 History  This chart was scribed for non-physician practitioner Emilia Beck, working with Azalia Bilis, MD by Carl Best, ED Scribe. This patient was seen in room TR05C/TR05C and the patient's care was started at 1:54 PM.   Chief Complaint  Patient presents with  . Dental Pain   Patient is a 49 y.o. female presenting with tooth pain. The history is provided by the patient. No language interpreter was used.  Dental Pain Associated symptoms: no fever    HPI Comments: [name] is a 49 y.o. female who presents to the Emergency Department complaining of intermittent, aching, throbbing lower, bilateral dental pain that started 7-8 months ago after a filling fell out.  Her symptoms started worsening last night after she ate BBQ chicken and a Snicker[employer] candy bar.  She took Aspirin with no relief to her pain.  She denies fever as an associated symptom.   Past Medical History  Diagnosis Date  . Hypercholesteremia   . Carpal tunnel syndrome    Past Surgical History  Procedure Laterality Date  . Cesarean section    . Cholecystectomy     Family History  Problem Relation Age of Onset  . Cancer Mother   . Hypertension Mother   . Hypertension Father    History  Substance Use Topics  . Smoking status: Current Every Day Smoker -- 0.30 packs/day    Types: Cigarettes  . Smokeless tobacco: Never Used  . Alcohol Use: No   OB History    No data available     Review of Systems  Constitutional: Negative for fever.  HENT: Positive for dental problem.   All other systems reviewed and are negative.     Allergies  Ibuprofen and Sulfa antibiotics  Home Medications   Prior to Admission medications   Medication Sig Start Date End Date Taking? Authorizing Provider  benzonatate (TESSALON) 100 MG capsule Take 1 capsule (100 mg total) by mouth every 8 (eight) hours. 05/21/14   Tiffany Neva Seat, PA-C  BLACK COHOSH EXTRACT PO Take 2  tablets by mouth daily.    Historical Provider, MD  HYDROcodone-acetaminophen (NORCO/VICODIN) 5-325 MG per tablet Take 1-2 tablets by mouth every 6 (six) hours as needed. 04/23/14   Santiago Glad, PA-C  Multiple Vitamin (MULTIVITAMIN WITH MINERALS) TABS Take 1 tablet by mouth daily.    Historical Provider, MD  naproxen (NAPROSYN) 500 MG tablet Take 1 tablet (500 mg total) by mouth 2 (two) times daily. 04/23/14   Santiago Glad, PA-C  oxyCODONE-acetaminophen (ROXICET) 5-325 MG per tablet Take 1-2 tablets by mouth every 4 (four) hours as needed for severe pain. Patient not taking: Reported on 04/02/2014 02/21/14   Arthor Captain, PA-C   Triage Vitals: BP 133/85 mmHg  Pulse 102  Temp(Src) 98.7 F (37.1 C) (Oral)  Resp 14  Ht 5' (1.524 m)  Wt 160 lb (72.576 kg)  BMI 31.25 kg/m2  SpO2 98%  LMP 06/16/2014  Physical Exam  Constitutional: She is oriented to person, place, and time. She appears well-developed and well-nourished. No distress.  HENT:  Head: Normocephalic and atraumatic.  Mouth/Throat: Oropharynx is clear and moist. No oropharyngeal exudate.  Poor dentition. Tenderness to percussion on bilateral lower premolars. No abscess noted.   Eyes: Conjunctivae and EOM are normal.  Neck: Normal range of motion.  Cardiovascular: Normal rate and regular rhythm.  Exam reveals no gallop and no friction rub.   No murmur heard. Pulmonary/Chest: Effort normal  and breath sounds normal. She has no wheezes. She has no rales. She exhibits no tenderness.  Abdominal: Soft. There is no tenderness.  Musculoskeletal: Normal range of motion.  Lymphadenopathy:    She has no cervical adenopathy.  Neurological: She is alert and oriented to person, place, and time.  Speech is goal-oriented. Moves limbs without ataxia.   Skin: Skin is warm and dry.  Psychiatric: She has a normal mood and affect. Her behavior is normal.  Nursing note and vitals reviewed.   ED Course  Procedures (including critical care  time)  DIAGNOSTIC STUDIES: Oxygen Saturation is 98% on room air, normal by my interpretation.    COORDINATION OF CARE: 1:56 PM- Discussed discharging the patient with pain medication and antibiotics.  Recommended patient follow-up with a dentist.  The patient agreed to the treatment plan.  Labs Review Labs Reviewed - No data to display  Imaging Review No results found.   EKG Interpretation None      MDM   Final diagnoses:  Pain, dental    2:04 PM Patient will have Naprosyn and Veetid for symptoms. No signs of ludwigs angina. Patient instructed to follow up with the dentist the has been referred. Patient instructed to return with worsening or concerning symptoms.   I personally performed the services described in this documentation, which was scribed in my presence. The recorded information has been reviewed and is accurate.    Emilia BeckKaitlyn Shamiracle Gorden, PA-C 07/16/14 1404  Azalia BilisKevin Campos, MD 07/16/14 [phone]

## 2014-08-13 ENCOUNTER — Emergency Department (HOSPITAL_COMMUNITY)
Admission: EM | Admit: 2014-08-13 | Discharge: 2014-08-13 | Disposition: A | Discharge status: Home / Self Care | Payer: Medicaid Other | Attending: Emergency Medicine | Admitting: Emergency Medicine

## 2014-08-13 ENCOUNTER — Encounter (HOSPITAL_COMMUNITY): Payer: Self-pay | Admitting: Neurology

## 2014-08-13 DIAGNOSIS — Z720 Tobacco use: Secondary | ICD-10-CM | POA: Diagnosis not present

## 2014-08-13 DIAGNOSIS — Z791 Long term (current) use of non-steroidal anti-inflammatories (NSAID): Secondary | ICD-10-CM | POA: Insufficient documentation

## 2014-08-13 DIAGNOSIS — H9201 Otalgia, right ear: Secondary | ICD-10-CM | POA: Diagnosis not present

## 2014-08-13 DIAGNOSIS — Z8639 Personal history of other endocrine, nutritional and metabolic disease: Secondary | ICD-10-CM | POA: Diagnosis not present

## 2014-08-13 DIAGNOSIS — Z79899 Other long term (current) drug therapy: Secondary | ICD-10-CM | POA: Insufficient documentation

## 2014-08-13 DIAGNOSIS — K088 Other specified disorders of teeth and supporting structures: Secondary | ICD-10-CM | POA: Diagnosis not present

## 2014-08-13 DIAGNOSIS — K0889 Other specified disorders of teeth and supporting structures: Secondary | ICD-10-CM

## 2014-08-13 MED ORDER — LORATADINE-PSEUDOEPHEDRINE ER 5-120 MG PO TB12: 1.0000 | ORAL_TABLET | Freq: Two times a day (BID) | ORAL | Status: DC

## 2014-08-13 MED ORDER — NAPROXEN 500 MG PO TABS: 500.0000 mg | ORAL_TABLET | Freq: Two times a day (BID) | ORAL | Status: DC

## 2014-08-13 MED ORDER — PENICILLIN V POTASSIUM 500 MG PO TABS: 500.0000 mg | ORAL_TABLET | Freq: Four times a day (QID) | ORAL | Status: DC

## 2014-08-13 NOTE — ED Notes (Signed)
Pt reports missing filling to right lower side. C/o right ear pain and throat pain.

## 2014-08-13 NOTE — Discharge Instructions (Signed)
Take Veetid as directed until gone. Take Claritin as directed for congestion. Take naprosyn as needed for pain. Refer to attached documents for more information.

## 2014-08-13 NOTE — ED Provider Notes (Signed)
CSN: [id number]     Arrival date & time 08/13/14  1003 History  This chart was scribed for non-physician practitioner, Emilia Beck, working with Gerhard Munch, MD by Richarda Overlie, ED Scribe. This patient was seen in room TR05C/TR05C and the patient's care was started at 10:59 AM.    Chief Complaint  Patient presents with  . Otalgia  . Dental Pain   The history is provided by the patient. No language interpreter was used.   HPI Comments: [name] is a 49 y.o. female with a history of hypercholesteremia and carpal tunnel syndrome who presents to the Emergency Department complaining of right ear pain for the last 2 days. Pt complains of right lower dental pain, cough and sore throat at this time. Pt reports that she is missing a filling on her right lower side. She states that swallowing aggravates her pain. Pt states that she takes aspirin regularly. Pt reports no alleviating factors at this time. She denies fever.   Past Medical History  Diagnosis Date  . Hypercholesteremia   . Carpal tunnel syndrome    Past Surgical History  Procedure Laterality Date  . Cesarean section    . Cholecystectomy     Family History  Problem Relation Age of Onset  . Cancer Mother   . Hypertension Mother   . Hypertension Father    History  Substance Use Topics  . Smoking status: Current Every Day Smoker -- 0.30 packs/day    Types: Cigarettes  . Smokeless tobacco: Never Used  . Alcohol Use: No   OB History    No data available     Review of Systems  Constitutional: Negative for fever.  HENT: Positive for dental problem, ear pain and sore throat.   All other systems reviewed and are negative.    Ibuprofen and Sulfa antibiotics  Home Medications   Prior to Admission medications   Medication Sig Start Date End Date Taking? Authorizing Provider  acetaminophen (TYLENOL 8 HOUR) 650 MG CR tablet Take 1 tablet (650 mg total) by mouth every 8 (eight) hours as needed for pain. 07/16/14    Alvan Culpepper, PA-C  benzonatate (TESSALON) 100 MG capsule Take 1 capsule (100 mg total) by mouth every 8 (eight) hours. 05/21/14   Tiffany Neva Seat, PA-C  BLACK COHOSH EXTRACT PO Take 2 tablets by mouth daily.    Historical Provider, MD  HYDROcodone-acetaminophen (NORCO/VICODIN) 5-325 MG per tablet Take 1-2 tablets by mouth every 6 (six) hours as needed. 04/23/14   Santiago Glad, PA-C  Multiple Vitamin (MULTIVITAMIN WITH MINERALS) TABS Take 1 tablet by mouth daily.    Historical Provider, MD  naproxen (NAPROSYN) 500 MG tablet Take 1 tablet (500 mg total) by mouth 2 (two) times daily with a meal. 07/16/14   Emilia Beck, PA-C  oxyCODONE-acetaminophen (ROXICET) 5-325 MG per tablet Take 1-2 tablets by mouth every 4 (four) hours as needed for severe pain. Patient not taking: Reported on 04/02/2014 02/21/14   Arthor Captain, PA-C  penicillin v potassium (VEETID) 500 MG tablet Take 1 tablet (500 mg total) by mouth 4 (four) times daily. 07/16/14   Makel Mcmann, PA-C   BP 148/76 mmHg  Pulse 98  Temp(Src) 98.3 F (36.8 C) (Oral)  Resp 17  Ht 5' (1.524 m)  Wt 160 lb (72.576 kg)  BMI 31.25 kg/m2  SpO2 100%  LMP 08/11/2014 Physical Exam  Constitutional: She is oriented to person, place, and time. She appears well-developed and well-nourished.  HENT:  Head: Normocephalic and atraumatic.  Mouth/Throat: No oropharyngeal exudate.  Fluid noted behind TM on the right. Bilateral TMs intact without erythema.   Eyes: Right eye exhibits no discharge. Left eye exhibits no discharge.  Neck: Neck supple. No tracheal deviation present.  Right cervical adenopathy.   Cardiovascular: Normal rate.   Pulmonary/Chest: Effort normal and breath sounds normal. No respiratory distress. She has no wheezes. She has no rales.  Abdominal: She exhibits no distension.  Lymphadenopathy:    She has cervical adenopathy.  Neurological: She is alert and oriented to person, place, and time.  Skin: Skin is warm and  dry.  Psychiatric: She has a normal mood and affect. Her behavior is normal.  Nursing note and vitals reviewed.   ED Course  Procedures   DIAGNOSTIC STUDIES: Oxygen Saturation is 100% on RA, normal by my interpretation.    COORDINATION OF CARE: 11:03 AM Discussed treatment plan with pt at bedside and pt agreed to plan.   Labs Review Labs Reviewed - No data to display  Imaging Review No results found.   EKG Interpretation None      MDM   Final diagnoses:  Pain, dental  Right ear pain   Patient has no signs of ludwigs angina or dental abscess. Patient likely has right ear pain due to fluids behind right TM. Patient will have claritin for congestion, veetid and naprosyn for dental pain. Vitals stable and patient afebrile.   I personally performed the services described in this documentation, which was scribed in my presence. The recorded information has been reviewed and is accurate.      Emilia BeckKaitlyn Holmes Hays, PA-C 08/13/14 1112  Gerhard Munchobert Lockwood, MD 08/16/14 [phone]

## 2014-10-28 ENCOUNTER — Emergency Department (HOSPITAL_COMMUNITY)
Admission: EM | Admit: 2014-10-28 | Discharge: 2014-10-28 | Disposition: A | Discharge status: Home / Self Care | Payer: Medicaid Other | Attending: Emergency Medicine | Admitting: Emergency Medicine

## 2014-10-28 ENCOUNTER — Emergency Department (HOSPITAL_COMMUNITY): Payer: Medicaid Other

## 2014-10-28 ENCOUNTER — Encounter (HOSPITAL_COMMUNITY): Payer: Self-pay | Admitting: *Deleted

## 2014-10-28 DIAGNOSIS — S3992XA Unspecified injury of lower back, initial encounter: Secondary | ICD-10-CM | POA: Diagnosis present

## 2014-10-28 DIAGNOSIS — Z79899 Other long term (current) drug therapy: Secondary | ICD-10-CM | POA: Diagnosis not present

## 2014-10-28 DIAGNOSIS — Z8639 Personal history of other endocrine, nutritional and metabolic disease: Secondary | ICD-10-CM | POA: Diagnosis not present

## 2014-10-28 DIAGNOSIS — Z791 Long term (current) use of non-steroidal anti-inflammatories (NSAID): Secondary | ICD-10-CM | POA: Insufficient documentation

## 2014-10-28 DIAGNOSIS — M545 Low back pain, unspecified: Secondary | ICD-10-CM

## 2014-10-28 DIAGNOSIS — Z8739 Personal history of other diseases of the musculoskeletal system and connective tissue: Secondary | ICD-10-CM | POA: Diagnosis not present

## 2014-10-28 DIAGNOSIS — Y999 Unspecified external cause status: Secondary | ICD-10-CM | POA: Insufficient documentation

## 2014-10-28 DIAGNOSIS — Y929 Unspecified place or not applicable: Secondary | ICD-10-CM | POA: Insufficient documentation

## 2014-10-28 DIAGNOSIS — Y939 Activity, unspecified: Secondary | ICD-10-CM | POA: Diagnosis not present

## 2014-10-28 DIAGNOSIS — Z792 Long term (current) use of antibiotics: Secondary | ICD-10-CM | POA: Insufficient documentation

## 2014-10-28 DIAGNOSIS — W108XXA Fall (on) (from) other stairs and steps, initial encounter: Secondary | ICD-10-CM | POA: Insufficient documentation

## 2014-10-28 DIAGNOSIS — Z720 Tobacco use: Secondary | ICD-10-CM | POA: Diagnosis not present

## 2014-10-28 DIAGNOSIS — W19XXXA Unspecified fall, initial encounter: Secondary | ICD-10-CM

## 2014-10-28 MED ORDER — HYDROCODONE-ACETAMINOPHEN 5-325 MG PO TABS: 1.0000 | ORAL_TABLET | ORAL | Status: DC | PRN

## 2014-10-28 MED ORDER — HYDROCODONE-ACETAMINOPHEN 5-325 MG PO TABS
1.0000 | ORAL_TABLET | Freq: Once | ORAL | Status: AC
  Administered 2014-10-28: 1 via ORAL
  Filled 2014-10-28: qty 1

## 2014-10-28 MED ORDER — CYCLOBENZAPRINE HCL 10 MG PO TABS: 10.0000 mg | ORAL_TABLET | Freq: Three times a day (TID) | ORAL | Status: DC | PRN

## 2014-10-28 MED ORDER — CYCLOBENZAPRINE HCL 10 MG PO TABS
10.0000 mg | ORAL_TABLET | Freq: Once | ORAL | Status: AC
Start: 2014-10-28 — End: 2014-10-28
  Administered 2014-10-28: 10 mg via ORAL
  Filled 2014-10-28: qty 1
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
## 2014-10-28 NOTE — Discharge Instructions (Signed)
\Muscle Strain A muscle strain is an injury that occurs when a muscle is stretched beyond its normal length. Usually a small number of muscle fibers are torn when this happens. Muscle strain is rated in degrees. First-degree strains have the least amount of muscle fiber tearing and pain. Second-degree and third-degree strains have increasingly more tearing and pain.  Usually, recovery from muscle strain takes 1-2 weeks. Complete healing takes 5-6 weeks.  CAUSES  Muscle strain happens when a sudden, violent force placed on a muscle stretches it too far. This may occur with lifting, sports, or a fall.  RISK FACTORS Muscle strain is especially common in athletes.  SIGNS AND SYMPTOMS At the site of the muscle strain, there may be:  Pain.  Bruising.  Swelling.  Difficulty using the muscle due to pain or lack of normal function. DIAGNOSIS  Your health care provider will perform a physical exam and ask about your medical history. TREATMENT  Often, the best treatment for a muscle strain is resting, icing, and applying cold compresses to the injured area.  HOME CARE INSTRUCTIONS   Use the PRICE method of treatment to promote muscle healing during the first 2-3 days after your injury. The PRICE method involves:  Protecting the muscle from being injured again.  Restricting your activity and resting the injured body part.  Icing your injury. To do this, put ice in a plastic bag. Place a towel between your skin and the bag. Then, apply the ice and leave it on from 15-20 minutes each hour. After the third day, switch to moist heat packs.  Apply compression to the injured area with a splint or elastic bandage. Be careful not to wrap it too tightly. This may interfere with blood circulation or increase swelling.  Elevate the injured body part above the level of your heart as often as you can.  Only take over-the-counter or prescription medicines for pain, discomfort, or fever as directed by your  health care provider.  Warming up prior to exercise helps to prevent future muscle strains. SEEK MEDICAL CARE IF:   You have increasing pain or swelling in the injured area.  You have numbness, tingling, or a significant loss of strength in the injured area. MAKE SURE YOU:   Understand these instructions.  Will watch your condition.  Will get help right away if you are not doing well or get worse. Document Released: 04/12/2005 Document Revised: 01/31/2013 Document Reviewed: 11/09/2012 [facility] Patient Information 2015 [place], [place]. This information is not intended to replace advice given to you by your health care provider. Make sure you discuss any questions you have with your health care provider. Cryotherapy Cryotherapy means treatment with cold. Ice or gel packs can be used to reduce both pain and swelling. Ice is the most helpful within the first 24 to 48 hours after an injury or flare-up from overusing a muscle or joint. Sprains, strains, spasms, burning pain, shooting pain, and aches can all be eased with ice. Ice can also be used when recovering from surgery. Ice is effective, has very few side effects, and is safe for most people to use. PRECAUTIONS  Ice is not a safe treatment option for people with:  Raynaud phenomenon. This is a condition affecting small blood vessels in the extremities. Exposure to cold may cause your problems to return.  Cold hypersensitivity. There are many forms of cold hypersensitivity, including:  Cold urticaria. Red, itchy hives appear on the skin when the tissues begin to warm after being  iced. °· Cold erythema. This is a red, itchy rash caused by exposure to cold. °· Cold hemoglobinuria. Red blood cells break down when the tissues begin to warm after being iced. The hemoglobin that carry oxygen are passed into the urine because they cannot combine with blood proteins fast enough. °· Numbness or altered sensitivity in the area being iced. °If you have  any of the following conditions, do not use ice until you have discussed cryotherapy with your caregiver: °· Heart conditions, such as arrhythmia, angina, or chronic heart disease. °· High blood pressure. °· Healing wounds or open skin in the area being iced. °· Current infections. °· Rheumatoid arthritis. °· Poor circulation. °· Diabetes. °Ice slows the blood flow in the region it is applied. This is beneficial when trying to stop inflamed tissues from spreading irritating chemicals to surrounding tissues. However, if you expose your skin to cold temperatures for too long or without the proper protection, you can damage your skin or nerves. Watch for signs of skin damage due to cold. °HOME CARE INSTRUCTIONS °Follow these tips to use ice and cold packs safely. °· Place a dry or damp towel between the ice and skin. A damp towel will cool the skin more quickly, so you may need to shorten the time that the ice is used. °· For a more rapid response, add gentle compression to the ice. °· Ice for no more than 10 to 20 minutes at a time. The bonier the area you are icing, the less time it will take to get the benefits of ice. °· Check your skin after 5 minutes to make sure there are no signs of a poor response to cold or skin damage. °· Rest 20 minutes or more between uses. °· Once your skin is numb, you can end your treatment. You can test numbness by very lightly touching your skin. The touch should be so light that you do not see the skin dimple from the pressure of your fingertip. When using ice, most people will feel these normal sensations in this order: cold, burning, aching, and numbness. °· Do not use ice on someone who cannot communicate their responses to pain, such as small children or people with dementia. °HOW TO MAKE AN ICE PACK °Ice packs are the most common way to use ice therapy. Other methods include ice massage, ice baths, and cryosprays. Muscle creams that cause a cold, tingly feeling do not offer the  same benefits that ice offers and should not be used as a substitute unless recommended by your caregiver. °To make an ice pack, do one of the following: °· Place crushed ice or a bag of frozen vegetables in a sealable plastic bag. Squeeze out the excess air. Place this bag inside another plastic bag. Slide the bag into a pillowcase or place a damp towel between your skin and the bag. °· Mix 3 parts water with 1 part rubbing alcohol. Freeze the mixture in a sealable plastic bag. When you remove the mixture from the freezer, it will be slushy. Squeeze out the excess air. Place this bag inside another plastic bag. Slide the bag into a pillowcase or place a damp towel between your skin and the bag. °SEEK MEDICAL CARE IF: °· You develop white spots on your skin. This may give the skin a blotchy (mottled) appearance. °· Your skin turns blue or pale. °· Your skin becomes waxy or hard. °· Your swelling gets worse. °MAKE SURE YOU:  °· Understand these instructions. °·   Will watch your condition.  Will get help right away if you are not doing well or get worse. Document Released: 12/07/2010 Document Revised: 08/27/2013 Document Reviewed: 12/07/2010 [facility] Patient Information 2015 [place]. This information is not intended to replace advice given to you by your health care provider. Make sure you discuss any questions you have with your health care provider. Back Pain, Adult Low back pain is very common. About 1 in 5 people have back pain.The cause of low back pain is rarely dangerous. The pain often gets better over time.About half of people with a sudden onset of back pain feel better in just 2 weeks. About 8 in 10 people feel better by 6 weeks.  CAUSES Some common causes of back pain include:  Strain of the muscles or ligaments supporting the spine.  Wear and tear (degeneration) of the spinal discs.  Arthritis.  Direct injury to the back. DIAGNOSIS Most of the time, the direct cause of low back  pain is not known.However, back pain can be treated effectively even when the exact cause of the pain is unknown.Answering your caregiver's questions about your overall health and symptoms is one of the most accurate ways to make sure the cause of your pain is not dangerous. If your caregiver needs more information, he or she may order lab work or imaging tests (X-rays or MRIs).However, even if imaging tests show changes in your back, this usually does not require surgery. HOME CARE INSTRUCTIONS For many people, back pain returns.Since low back pain is rarely dangerous, it is often a condition that people can learn to [facility]on their own.   Remain active. It is stressful on the back to sit or stand in one place. Do not sit, drive, or stand in one place for more than 30 minutes at a time. Take short walks on level surfaces as soon as pain allows.Try to increase the length of time you walk each day.  Do not stay in bed.Resting more than 1 or 2 days can delay your recovery.  Do not avoid exercise or work.Your body is made to move.It is not dangerous to be active, even though your back may hurt.Your back will likely heal faster if you return to being active before your pain is gone.  Pay attention to your body when you bend and lift. Many people have less discomfortwhen lifting if they bend their knees, keep the load close to their bodies,and avoid twisting. Often, the most comfortable positions are those that put less stress on your recovering back.  Find a comfortable position to sleep. Use a firm mattress and lie on your side with your knees slightly bent. If you lie on your back, put a pillow under your knees.  Only take over-the-counter or prescription medicines as directed by your caregiver. Over-the-counter medicines to reduce pain and inflammation are often the most helpful.Your caregiver may prescribe muscle relaxant drugs.These medicines help dull your pain so you can more quickly  return to your normal activities and healthy exercise.  Put ice on the injured area.  Put ice in a plastic bag.  Place a towel between your skin and the bag.  Leave the ice on for 15-20 minutes, 03-04 times a day for the first 2 to 3 days. After that, ice and heat may be alternated to reduce pain and spasms.  Ask your caregiver about trying back exercises and gentle massage. This may be of some benefit.  Avoid feeling anxious or stressed.Stress increases muscle tension and  can worsen back pain.It is important to recognize when you are anxious or stressed and learn ways to manage it.Exercise is a great option. SEEK MEDICAL CARE IF:  You have pain that is not relieved with rest or medicine.  You have pain that does not improve in 1 week.  You have new symptoms.  You are generally not feeling well. SEEK IMMEDIATE MEDICAL CARE IF:   You have pain that radiates from your back into your legs.  You develop new bowel or bladder control problems.  You have unusual weakness or numbness in your arms or legs.  You develop nausea or vomiting.  You develop abdominal pain.  You feel faint. Document Released: 04/12/2005 Document Revised: 10/12/2011 Document Reviewed: 08/14/2013 [facility] Patient Information 2015 [place], [place]. This information is not intended to replace advice given to you by your health care provider. Make sure you discuss any questions you have with your health care provider.

## 2014-10-28 NOTE — ED Notes (Signed)
Pt states that she fell forward down 4 steps; pt states that she mis-stepped; pt c/o lower back pain worse after standing since fall and pt c/o headache; pt states that she thinks she hit her head; pt denies LOC; pt ambulatory since incident; no numbness or tingling to extremities

## 2014-11-25 ENCOUNTER — Emergency Department (HOSPITAL_COMMUNITY)
Admission: EM | Admit: 2014-11-25 | Discharge: 2014-11-25 | Disposition: A | Discharge status: Home / Self Care | Payer: Medicaid Other | Attending: Emergency Medicine | Admitting: Emergency Medicine

## 2014-11-25 ENCOUNTER — Encounter (HOSPITAL_COMMUNITY): Payer: Self-pay

## 2014-11-25 DIAGNOSIS — K088 Other specified disorders of teeth and supporting structures: Secondary | ICD-10-CM | POA: Diagnosis present

## 2014-11-25 DIAGNOSIS — Z8669 Personal history of other diseases of the nervous system and sense organs: Secondary | ICD-10-CM | POA: Diagnosis not present

## 2014-11-25 DIAGNOSIS — Z720 Tobacco use: Secondary | ICD-10-CM | POA: Insufficient documentation

## 2014-11-25 DIAGNOSIS — K120 Recurrent oral aphthae: Secondary | ICD-10-CM | POA: Diagnosis not present

## 2014-11-25 DIAGNOSIS — Z79899 Other long term (current) drug therapy: Secondary | ICD-10-CM | POA: Insufficient documentation

## 2014-11-25 DIAGNOSIS — Z8639 Personal history of other endocrine, nutritional and metabolic disease: Secondary | ICD-10-CM | POA: Diagnosis not present

## 2014-11-25 DIAGNOSIS — Z791 Long term (current) use of non-steroidal anti-inflammatories (NSAID): Secondary | ICD-10-CM | POA: Insufficient documentation

## 2014-11-25 MED ORDER — MAGIC MOUTHWASH W/LIDOCAINE: 5.0000 mL | Freq: Three times a day (TID) | ORAL | Status: DC | PRN

## 2014-11-25 MED ORDER — HYDROCODONE-ACETAMINOPHEN 5-325 MG PO TABS: 2.0000 | ORAL_TABLET | ORAL | Status: DC | PRN

## 2014-11-25 NOTE — Discharge Instructions (Signed)
Take Vicodin as needed for pain. Use magic mouthwash as needed. Follow up with your dentist.

## 2014-11-25 NOTE — ED Notes (Signed)
Pt left with all belongings, dentures in hand.

## 2014-11-25 NOTE — ED Provider Notes (Signed)
CSN: [id number]     Arrival date & time 11/25/14  0015 History   First MD Initiated Contact with Patient 11/25/14 0032     Chief Complaint  Patient presents with  . Dental Pain     (Consider location/radiation/quality/duration/timing/severity/associated sxs/prior Treatment) Patient is a 49 y.o. female presenting with tooth pain.  Dental Pain Location:  Upper Upper teeth location: gum. Quality:  Sharp Severity:  Moderate Onset quality:  Gradual Duration:  2 days Timing:  Constant Progression:  Worsening Chronicity:  New Context: not abscess, cap still on, not dental caries, filling still in place, not intrusion, not malocclusion, not recent dental surgery and not trauma   Relieved by:  Nothing Worsened by:  Touching Ineffective treatments:  None tried Associated symptoms: gum swelling   Associated symptoms: no congestion, no difficulty swallowing, no drooling, no facial pain, no facial swelling, no fever, no neck swelling and no oral lesions   Risk factors: no alcohol problem, no cancer, no chewing tobacco use, no diabetes, no immunosuppression, sufficient dental care, no periodontal disease and no smoking     Past Medical History  Diagnosis Date  . Hypercholesteremia   . Carpal tunnel syndrome    Past Surgical History  Procedure Laterality Date  . Cesarean section    . Cholecystectomy     Family History  Problem Relation Age of Onset  . Cancer Mother   . Hypertension Mother   . Hypertension Father    History  Substance Use Topics  . Smoking status: Current Every Day Smoker -- 0.30 packs/day    Types: Cigarettes  . Smokeless tobacco: Never Used  . Alcohol Use: No   OB History    No data available     Review of Systems  Constitutional: Negative for fever.  HENT: Positive for dental problem. Negative for congestion, drooling, facial swelling and mouth sores.   All other systems reviewed and are negative.     Allergies  Ibuprofen and Sulfa  antibiotics  Home Medications   Prior to Admission medications   Medication Sig Start Date End Date Taking? Authorizing Provider  acetaminophen (TYLENOL 8 HOUR) 650 MG CR tablet Take 1 tablet (650 mg total) by mouth every 8 (eight) hours as needed for pain. 07/16/14   Darline Faith, PA-C  benzonatate (TESSALON) 100 MG capsule Take 1 capsule (100 mg total) by mouth every 8 (eight) hours. 05/21/14   Tiffany Neva Seat, PA-C  BLACK COHOSH EXTRACT PO Take 2 tablets by mouth daily.    Historical Provider, MD  cyclobenzaprine (FLEXERIL) 10 MG tablet Take 1 tablet (10 mg total) by mouth 3 (three) times daily as needed for muscle spasms. 10/28/14   Elpidio Anis, PA-C  HYDROcodone-acetaminophen (NORCO/VICODIN) 5-325 MG per tablet Take 1 tablet by mouth every 4 (four) hours as needed for moderate pain. 10/28/14   Elpidio Anis, PA-C  loratadine-pseudoephedrine (CLARITIN-D 12 HOUR) 5-120 MG per tablet Take 1 tablet by mouth 2 (two) times daily. 08/13/14   Emilia Beck, PA-C  Multiple Vitamin (MULTIVITAMIN WITH MINERALS) TABS Take 1 tablet by mouth daily.    Historical Provider, MD  naproxen (NAPROSYN) 500 MG tablet Take 1 tablet (500 mg total) by mouth 2 (two) times daily with a meal. 08/13/14   Emilia Beck, PA-C  oxyCODONE-acetaminophen (ROXICET) 5-325 MG per tablet Take 1-2 tablets by mouth every 4 (four) hours as needed for severe pain. Patient not taking: Reported on 04/02/2014 02/21/14   Arthor Captain, PA-C  penicillin v potassium (VEETID) 500 MG tablet Take  1 tablet (500 mg total) by mouth 4 (four) times daily. 08/13/14   Tannon Peerson, PA-C   BP 124/94 mmHg  Pulse 104  Temp(Src) 98.9 F (37.2 C) (Oral)  Resp 18  SpO2 100%  LMP 10/22/2014 Physical Exam  Constitutional: She is oriented to person, place, and time. She appears well-developed and well-nourished. No distress.  HENT:  Head: Normocephalic and atraumatic.  Ulcerative lesion noted at the right upper, anterior gum with  tenderness to palpation.   Eyes: Conjunctivae and EOM are normal.  Neck: Normal range of motion.  Cardiovascular: Normal rate and regular rhythm.  Exam reveals no gallop and no friction rub.   No murmur heard. Pulmonary/Chest: Effort normal and breath sounds normal. She has no wheezes. She has no rales. She exhibits no tenderness.  Abdominal: Soft. She exhibits no distension. There is no tenderness. There is no rebound.  Musculoskeletal: Normal range of motion.  Neurological: She is alert and oriented to person, place, and time. Coordination normal.  Speech is goal-oriented. Moves limbs without ataxia.   Skin: Skin is warm and dry.  Psychiatric: She has a normal mood and affect. Her behavior is normal.  Nursing note and vitals reviewed.   ED Course  Procedures (including critical care time) Labs Review Labs Reviewed - No data to display  Imaging Review No results found.   EKG Interpretation None      MDM   Final diagnoses:  Canker sore   12:49 AM Patient has a canker sore and will be treated with magic mouthwash and vicodin. No further evaluation needed at this time. Patient advised to follow up with her dentist.    Emilia Beck, PA-C 11/25/14 1610  Mirian Mo, MD 11/28/14 [phone]

## 2014-11-25 NOTE — ED Notes (Addendum)
Pt reports upper "gum pain" onset 2 days ago. She has dentures but too painful to wear. No bleeding noted to gums

## 2014-12-31 ENCOUNTER — Emergency Department (HOSPITAL_COMMUNITY)
Admission: EM | Admit: 2014-12-31 | Discharge: 2014-12-31 | Disposition: A | Discharge status: Home / Self Care | Payer: Medicaid Other | Attending: Emergency Medicine | Admitting: Emergency Medicine

## 2014-12-31 ENCOUNTER — Encounter (HOSPITAL_COMMUNITY): Payer: Self-pay | Admitting: Emergency Medicine

## 2014-12-31 DIAGNOSIS — Z8669 Personal history of other diseases of the nervous system and sense organs: Secondary | ICD-10-CM | POA: Diagnosis not present

## 2014-12-31 DIAGNOSIS — Z720 Tobacco use: Secondary | ICD-10-CM | POA: Insufficient documentation

## 2014-12-31 DIAGNOSIS — N898 Other specified noninflammatory disorders of vagina: Secondary | ICD-10-CM | POA: Diagnosis present

## 2014-12-31 DIAGNOSIS — Z792 Long term (current) use of antibiotics: Secondary | ICD-10-CM | POA: Diagnosis not present

## 2014-12-31 DIAGNOSIS — Z3202 Encounter for pregnancy test, result negative: Secondary | ICD-10-CM | POA: Insufficient documentation

## 2014-12-31 DIAGNOSIS — M549 Dorsalgia, unspecified: Secondary | ICD-10-CM | POA: Diagnosis not present

## 2014-12-31 DIAGNOSIS — Z79899 Other long term (current) drug therapy: Secondary | ICD-10-CM | POA: Insufficient documentation

## 2014-12-31 DIAGNOSIS — Z791 Long term (current) use of non-steroidal anti-inflammatories (NSAID): Secondary | ICD-10-CM | POA: Insufficient documentation

## 2014-12-31 DIAGNOSIS — Z8639 Personal history of other endocrine, nutritional and metabolic disease: Secondary | ICD-10-CM | POA: Insufficient documentation

## 2014-12-31 LAB — URINALYSIS, ROUTINE W REFLEX MICROSCOPIC
BILIRUBIN URINE: NEGATIVE
Glucose, UA: NEGATIVE mg/dL
Hgb urine dipstick: NEGATIVE
KETONES UR: NEGATIVE mg/dL
LEUKOCYTES UA: NEGATIVE
NITRITE: NEGATIVE
PH: 6.5 (ref 5.0–8.0)
PROTEIN: NEGATIVE mg/dL
Specific Gravity, Urine: 1.018 (ref 1.005–1.030)
UROBILINOGEN UA: 0.2 mg/dL (ref 0.0–1.0)

## 2014-12-31 LAB — WET PREP, GENITAL
CLUE CELLS WET PREP: NONE SEEN
TRICH WET PREP: NONE SEEN
Yeast Wet Prep HPF POC: NONE SEEN

## 2014-12-31 LAB — PREGNANCY, URINE: Preg Test, Ur: NEGATIVE

## 2014-12-31 MED ORDER — HYDROCODONE-ACETAMINOPHEN 5-325 MG PO TABS: 1.0000 | ORAL_TABLET | ORAL | Status: DC | PRN

## 2014-12-31 MED ORDER — ONDANSETRON 4 MG PO TBDP
4.0000 mg | ORAL_TABLET | Freq: Once | ORAL | Status: AC
  Administered 2014-12-31: 4 mg via ORAL
  Filled 2014-12-31: qty 1

## 2014-12-31 MED ORDER — HYDROCODONE-ACETAMINOPHEN 5-325 MG PO TABS
1.0000 | ORAL_TABLET | Freq: Once | ORAL | Status: AC
  Administered 2014-12-31: 1 via ORAL
  Filled 2014-12-31: qty 1

## 2014-12-31 NOTE — Progress Notes (Signed)
pcp is [facility] [address] [place],  [id number] [phone] 

## 2014-12-31 NOTE — Discharge Instructions (Signed)
1. Medications: vicodin, usual home medications 2. Treatment: rest, drink plenty of fluids 3. Follow Up: please followup with your primary doctor for discussion of your diagnoses and further evaluation after today's visit; if you do not have a primary care doctor use the resource guide provided to find one; please return to the ER for bowel or bladder incontinence, numbness in groin, weakness, fever, vaginal bleeding, new or worsening symptoms   Back Exercises Back exercises help treat and prevent back injuries. The goal is to increase your strength in your belly (abdominal) and back muscles. These exercises can also help with flexibility. Start these exercises when told by your doctor. HOME CARE Back exercises include: Pelvic Tilt.  Lie on your back with your knees bent. Tilt your pelvis until the lower part of your back is against the floor. Hold this position 5 to 10 sec. Repeat this exercise 5 to 10 times. Knee to Chest.  Pull 1 knee up against your chest and hold for 20 to 30 seconds. Repeat this with the other knee. This may be done with the other leg straight or bent, whichever feels better. Then, pull both knees up against your chest. Sit-Ups or Curl-Ups.  Bend your knees 90 degrees. Start with tilting your pelvis, and do a partial, slow sit-up. Only lift your upper half 30 to 45 degrees off the floor. Take at least 2 to 3 seonds for each sit-up. Do not do sit-ups with your knees out straight. If partial sit-ups are difficult, simply do the above but with only tightening your belly (abdominal) muscles and holding it as told. Hip-Lift.  Lie on your back with your knees flexed 90 degrees. Push down with your feet and shoulders as you raise your hips 2 inches off the floor. Hold for 10 seconds, repeat 5 to 10 times. Back Arches.  Lie on your stomach. Prop yourself up on bent elbows. Slowly press on your hands, causing an arch in your low back. Repeat 3 to 5 times. Shoulder-Lifts.  Lie  face down with arms beside your body. Keep hips and belly pressed to floor as you slowly lift your head and shoulders off the floor. Do not overdo your exercises. Be careful in the beginning. Exercises may cause you some mild back discomfort. If the pain lasts for more than 15 minutes, stop the exercises until you see your doctor. Improvement with exercise for back problems is slow.  Document Released: 05/15/2010 Document Revised: 07/05/2011 Document Reviewed: 02/11/2011 [facility] Patient Information 2015 [place], [place]. This information is not intended to replace advice given to you by your health care provider. Make sure you discuss any questions you have with your health care provider.  Back Pain, Adult Back pain is very common. The pain often gets better over time. The cause of back pain is usually not dangerous. Most people can learn to manage their back pain on their own.  HOME CARE   Stay active. Start with short walks on flat ground if you can. Try to walk farther each day.  Do not sit, drive, or stand in one place for more than 30 minutes. Do not stay in bed.  Do not avoid exercise or work. Activity can help your back heal faster.  Be careful when you bend or lift an object. Bend at your knees, keep the object close to you, and do not twist.  Sleep on a firm mattress. Lie on your side, and bend your knees. If you lie on your back, put a  pillow under your knees.  Only take medicines as told by your doctor.  Put ice on the injured area.  Put ice in a plastic bag.  Place a towel between your skin and the bag.  Leave the ice on for 15-20 minutes, 03-04 times a day for the first 2 to 3 days. After that, you can switch between ice and heat packs.  Ask your doctor about back exercises or massage.  Avoid feeling anxious or stressed. Find good ways to deal with stress, such as exercise. GET HELP RIGHT AWAY IF:   Your pain does not go away with rest or medicine.  Your pain does not  go away in 1 week.  You have new problems.  You do not feel well.  The pain spreads into your legs.  You cannot control when you poop (bowel movement) or pee (urinate).  Your arms or legs feel weak or lose feeling (numbness).  You feel sick to your stomach (nauseous) or throw up (vomit).  You have belly (abdominal) pain.  You feel like you may pass out (faint). MAKE SURE YOU:   Understand these instructions.  Will watch your condition.  Will get help right away if you are not doing well or get worse. Document Released: 09/29/2007 Document Revised: 07/05/2011 Document Reviewed: 08/14/2013 [facility] Patient Information 2015 [place], [place]. This information is not intended to replace advice given to you by your health care provider. Make sure you discuss any questions you have with your health care provider.   Emergency Department Resource Guide 1) Find a Doctor and Pay Out of Pocket Although you won't have to find out who is covered by your insurance plan, it is a good idea to ask around and get recommendations. You will then need to call the office and see if the doctor you have chosen will accept you as a new patient and what types of options they offer for patients who are self-pay. Some doctors offer discounts or will set up payment plans for their patients who do not have insurance, but you will need to ask so you aren't surprised when you get to your appointment.  2) Contact Your Local Health Department Not all health departments have doctors that can see patients for sick visits, but many do, so it is worth a call to see if yours does. If you don't know where your local health department is, you can check in your phone book. The CDC also has a tool to help you locate your state's health department, and many state websites also have listings of all of their local health departments.  3) Find a Walk-in Clinic If your illness is not likely to be very severe or complicated, you  may want to try a walk in clinic. These are popping up all over the country in pharmacies, drugstores, and shopping centers. They're usually staffed by nurse practitioners or physician assistants that have been trained to treat common illnesses and complaints. They're usually fairly quick and inexpensive. However, if you have serious medical issues or chronic medical problems, these are probably not your best option.  No Primary Care Doctor: - Call Health Connect at  [phone] - they can help you locate a primary care doctor that  accepts your insurance, provides certain services, etc. - Physician Referral Service- [phone]  Chronic Pain Problems: Organization         Address  Phone   Notes  [facility]  [phone] Patients need to be  referred by their primary care doctor.   Medication Assistance: Organization         Address  Phone   Notes  [facility] Med[facility] [address] [place] [place], [place] [address] [phone] --Must be a resident of [facility] -- Must have NO insurance coverage whatsoever (no Medicaid/ Medicare, etc.) -- The pt. MUST have a primary care doctor that directs their care regularly and follows them in the community   [facility]  [phone]   [employer]  [phone]    Agencies that provide inexpensive medical care: Organization         Address  Phone   Notes  [facility]  [phone]   [facility]    [phone]   [facility] [address] [place], [place] [address] [phone]   [facility] [address] [place] [place] [phone]   Planned Parenthood    [phone]   [facility]    [phone]   [facility]  [address], [place] Phone:  [phone], Fax:  [phone] Hours of Operation:  9 am - 6 pm, M-F.  Also accepts Medicaid/Medicare and  self-pay.  [facility]  [address], [place] Phone: [phone], Fax: [phone]. Hours of Operation:  8:30 am - 5:30 pm, M-F.  Also accepts Medicaid and self-pay.  [facility] [place] [address], [place] Phone: [phone]   [facility] [address] Natasha Bence [place], [place] [phone], Ext. [phone] Mondays & Thursdays: 7-9 AM.  First 15 patients are seen on a first come, first serve basis.    Medicaid-accepting [facility] Providers:  Organization         Address  Phone   Notes  [facility] [address], [place] [phone] Also accepts self-pay patients.  [facility] [address] Laurell Josephs [place], [place]  [phone]   [facility] [address], [place] [phone]   [facility] [address], [place] [phone]   Renaye Rakers [address], [place]   [phone] Only accepts [place] patients after they have their name applied to their card.   Self-Pay (no insurance) in [facility]:  Organization         Address  Phone   Notes  Sickle Cell Patients, [facility] [address] [place], [place] [phone]   [facility] [address] [place], [place] [phone]   [facility] [place]  [address],  [phone]   [facility]/Dr. Osei-Bonsu  [address], [place] or [address], [place] [phone] Phone number for both [place] and [place] locations is the same.  [facility] [address], [place] [phone]   [facility] [address] [place] or [address] [phone]   [facility] [address], [place] [phone], phone;  [phone], fax Sees patients 1st and 3rd Saturday of every month.  Must not qualify for public or private insurance (i.e. Medicaid, Medicare, [place] Health Choice, Veterans' Benefits)  Household income should be no  more than 200% of the poverty level The clinic cannot treat you if you are pregnant or think you are pregnant  Sexually transmitted diseases are not treated at the clinic.    Dental Care: Organization         Address  Phone  Notes  [facility] Department of [facility] [address] [place], [place] [phone] Accepts children up to age 37 who are enrolled in [place] or [place] Health Choice; pregnant women with a Medicaid card; and children who have applied for Medicaid or [place] Health Choice, but were declined, whose parents can pay a reduced fee at time of service.  [facility] of [facility]  [address], [place] [phone] Accepts children up to age 92 who are enrolled in [place] or [place] Health Choice; pregnant women with a Medicaid card; and children who have applied for Medicaid or [place] Health Choice, but were declined, whose parents can pay a reduced fee at time of service.  [facility]  [address] [place], [place] [phone] Patients are seen by appointment only. Walk-ins are not accepted. [facility] will see patients 66 years of age and older. Monday - Tuesday (8am-5pm) Most Wednesdays (8:30-5pm) $30 per visit, cash only  [facility]  [address], [facility] [phone] Patients are seen by appointment only. Walk-ins are not accepted. [facility] will see patients 34 years of age and older. One Wednesday Evening (Monthly: Volunteer Based).  $30 per visit, cash only  [employer]  [phone] for adults; Children under age 42, call Graduate Pediatric Dentistry at [phone]. Children aged 37-14, please call  [phone] to request a pediatric application.  Dental services are provided in all areas of dental care including fillings, crowns and bridges, complete and partial dentures, implants, gum treatment, root canals, and extractions. Preventive care is also provided. Treatment is provided to both adults and children. Patients are selected via a lottery and there is often a waiting list.   [facility] [address] [place]  [phone] [email or web address]   [facility] [address] [place], [place] [phone], Ext. [phone] Second and Fourth Thursday of each month, opens at 6:30 AM; Clinic ends at 9 AM.  Patients are seen on a first-come first-served basis, and a limited number are seen during each clinic.   [facility]  [address] Ether [place], [place] [phone]   Eligibility Requirements You must have lived in [place], [place], or [place] counties for at least the last three months.   You cannot be eligible for state or federal sponsored [place], including [employer], [place], or [employer].   You generally cannot be eligible for healthcare insurance through your employer.    How to apply: Eligibility screenings are held every Tuesday and Wednesday afternoon from 1:00 pm until 4:00 pm. You do not need an appointment for the interview!  [facility] [address] [place], [place] [phone]   [facility] Health Department  [phone]   [facility] Health Department  [phone]   [facility] Health Department  [phone]    Behavioral Health Resources in the Community: Intensive Outpatient Programs Organization         Address  Phone  Notes  [facility] [address], [place], [place] [phone]   [facility] [address], [place], [place] [phone]   [facility] [address], [place], [place]   [phone]   [facility] Mental Health [address],  [place], [place] [phone] or [phone]   Substance Abuse Resources Organization         Address  Phone  Notes  [facility]  [phone]   [facility]  [phone]   [facility] [place]  [phone]   Floydene Flock  [phone]   Residential & Outpatient Substance Abuse Program  [phone]   Psychological Services Organization         Address  Phone  Notes  [facility] Behavioral Health  [phone]   [facility]  [phone]   [facility] Mental Health [address], [place] [phone] or [phone]    Mobile Crisis Teams Organization         Address  Phone  Notes  [facility], Mobile Crisis Care Unit  [phone]   [facility]  [address] [place], [place] [phone]   Doristine Locks [address] [place] [phone]    Self-Help/Support Groups Organization         Address  Phone             Notes  [facility] [place] - variety of support groups  336- I7437963 Call for more information  Narcotics Anonymous (NA), [facility] [address], [employer] [place]  2 meetings at this location   Statistician         Address  Phone  Notes  [facility] [address],    [place]  [phone]   [facility]  [address], [place] [address], [place], [place] [phone]   [facility] [address] [place], [place] [phone] Admissions: 8am-3pm M-F  [facility] [address],    [place], [place] [phone]   [facility] [address] [place], [place], [place] [phone]   [facility] [address],  [place], [place] [phone]   [facility] - Intensive Outpatient [address] Laurell Josephs [address], [place], [place] [phone]   [facility] (Addiction Recovery  Care Assoc.) [address] [place]  [place], [place] [phone] or [phone]   [facility] ([facility]) [address], [place], [place] [phone] Accepts Medicaid  [facility] [address]  [place] [phone] Substance Abuse/Addiction Treatment   [facility] Organization         Address  Phone  Notes  [facility]  [phone]   Angie Fava, PhD [address] Ervin Knack [place], [place]   [phone] or [phone]   [facility] Behavioral   [address] [place], [place] [phone]   [facility] [address] [place], [place] [phone] Insurance/Medicaid/sponsorship through [facility] Families [address]                                    [place], [place] [phone] Therapy/tele-psych/case  [facility] [address], [place] [phone]    Dr. Lolly Mustache  [phone]   [facility]  [employer] [facility] Dept. 1) [address] [place] 2) [address], [place] 3)  [phone]  [address], [place] [phone]  [phone]   [facility] Child Abuse Hotline [phone] or [phone] (After Hours)

## 2014-12-31 NOTE — ED Provider Notes (Signed)
CSN: [id number]     Arrival date & time 12/31/14  1637 History   First MD Initiated Contact with Patient 12/31/14 1728     Chief Complaint  Patient presents with  . Vaginitis     HPI   [name] is a 49 y.o. female with a PMH of HLD who presents to the ED with back pain and vaginal irritation x 2-3 days. She reports constant back pain in her left lower back. She states standing up for 15-20 minutes at a time exacerbates her pain. She has tried excedrin for symptom relief, which has not been effective. States she fell in down stairs in July, but thinks she has recovered since that time. Denies additional recent injury. Denies bowel or bladder incontinence, saddle anesthesia, weakness, numbness, paresthesia, difficulty ambulating. She states she experiences similar back pain when she has a bacterial vaginal infection. She also reports vaginal irritation and itching. Denies vaginal discharge, dysuria, urgency, frequency, abdominal pain, vomiting, diarrhea, constipation. Reports nausea. States she has not tried anything for symptom relief.    Past Medical History  Diagnosis Date  . Hypercholesteremia   . Carpal tunnel syndrome    Past Surgical History  Procedure Laterality Date  . Cesarean section    . Cholecystectomy     Family History  Problem Relation Age of Onset  . Cancer Mother   . Hypertension Mother   . Hypertension Father    Social History  Substance Use Topics  . Smoking status: Current Every Day Smoker -- 0.30 packs/day    Types: Cigarettes  . Smokeless tobacco: Never Used  . Alcohol Use: No   OB History    No data available      Review of Systems  Constitutional: Negative for fever, chills, activity change, appetite change and fatigue.  Respiratory: Negative for shortness of breath.   Cardiovascular: Negative for chest pain, palpitations and leg swelling.  Gastrointestinal: Negative for nausea, vomiting, abdominal pain, diarrhea, constipation and abdominal  distention.  Genitourinary: Negative for dysuria, urgency, frequency and vaginal discharge.       Reports vaginal irritation and itching.  Musculoskeletal: Positive for back pain. Negative for myalgias, arthralgias, gait problem, neck pain and neck stiffness.  Skin: Negative for color change, pallor, rash and wound.  Neurological: Negative for dizziness, syncope, weakness, light-headedness, numbness and headaches.  All other systems reviewed and are negative.     Allergies  Ibuprofen and Sulfa antibiotics  Home Medications   Prior to Admission medications   Medication Sig Start Date End Date Taking? Authorizing Provider  acetaminophen (TYLENOL 8 HOUR) 650 MG CR tablet Take 1 tablet (650 mg total) by mouth every 8 (eight) hours as needed for pain. 07/16/14   Emilia Beck, PA-C  Alum & Mag Hydroxide-Simeth (MAGIC MOUTHWASH W/LIDOCAINE) SOLN Take 5 mLs by mouth 3 (three) times daily as needed for mouth pain. 11/25/14   Kaitlyn Szekalski, PA-C  benzonatate (TESSALON) 100 MG capsule Take 1 capsule (100 mg total) by mouth every 8 (eight) hours. 05/21/14   Tiffany Neva Seat, PA-C  BLACK COHOSH EXTRACT PO Take 2 tablets by mouth daily.    Historical Provider, MD  cyclobenzaprine (FLEXERIL) 10 MG tablet Take 1 tablet (10 mg total) by mouth 3 (three) times daily as needed for muscle spasms. 10/28/14   Elpidio Anis, PA-C  HYDROcodone-acetaminophen (NORCO/VICODIN) 5-325 MG per tablet Take 2 tablets by mouth every 4 (four) hours as needed. 11/25/14   Kaitlyn Szekalski, PA-C  loratadine-pseudoephedrine (CLARITIN-D 12 HOUR) 5-120 MG per  tablet Take 1 tablet by mouth 2 (two) times daily. 08/13/14   Emilia Beck, PA-C  Multiple Vitamin (MULTIVITAMIN WITH MINERALS) TABS Take 1 tablet by mouth daily.    Historical Provider, MD  naproxen (NAPROSYN) 500 MG tablet Take 1 tablet (500 mg total) by mouth 2 (two) times daily with a meal. 08/13/14   Emilia Beck, PA-C  oxyCODONE-acetaminophen (ROXICET) 5-325 MG  per tablet Take 1-2 tablets by mouth every 4 (four) hours as needed for severe pain. Patient not taking: Reported on 04/02/2014 02/21/14   Arthor Captain, PA-C  penicillin v potassium (VEETID) 500 MG tablet Take 1 tablet (500 mg total) by mouth 4 (four) times daily. 08/13/14   Kaitlyn Szekalski, PA-C    BP 129/73 mmHg  Pulse 91  Temp(Src) 98.2 F (36.8 C) (Oral)  SpO2 100%  LMP 12/17/2014 Physical Exam  Constitutional: She is oriented to person, place, and time. She appears well-developed and well-nourished. No distress.  HENT:  Head: Normocephalic and atraumatic.  Right Ear: External ear normal.  Left Ear: External ear normal.  Nose: Nose normal.  Mouth/Throat: Uvula is midline, oropharynx is clear and moist and mucous membranes are normal.  Eyes: Conjunctivae, EOM and lids are normal. Pupils are equal, round, and reactive to light. Right eye exhibits no discharge. Left eye exhibits no discharge. No scleral icterus.  Neck: Normal range of motion. Neck supple.  Cardiovascular: Normal rate, regular rhythm, normal heart sounds, intact distal pulses and normal pulses.   Pulmonary/Chest: Effort normal and breath sounds normal. No respiratory distress. She has no wheezes. She has no rales.  Abdominal: Soft. Normal appearance and bowel sounds are normal. She exhibits no distension and no mass. There is no tenderness. There is no rigidity, no rebound and no guarding.  Genitourinary: There is no rash or tenderness on the right labia. There is no rash or tenderness on the left labia. Cervix exhibits discharge. Cervix exhibits no motion tenderness and no friability. Right adnexum displays no mass, no tenderness and no fullness. Left adnexum displays no mass, no tenderness and no fullness. No erythema, tenderness or bleeding in the vagina. No foreign body around the vagina. No signs of injury around the vagina. Vaginal discharge found.  Small amount of white discharge in vaginal vault.  Musculoskeletal:  Normal range of motion. She exhibits no edema or tenderness.  TTP of left paraspinal muscles. No midline tenderness, step-off, or deformity.  Neurological: She is alert and oriented to person, place, and time. She has normal strength. No sensory deficit. Gait normal.  Skin: Skin is warm, dry and intact. No rash noted. She is not diaphoretic. No erythema. No pallor.  Psychiatric: She has a normal mood and affect. Her speech is normal and behavior is normal. Judgment and thought content normal.  Nursing note and vitals reviewed.   ED Course  Procedures (including critical care time)  Labs Review Labs Reviewed  WET PREP, GENITAL - Abnormal; Notable for the following:    WBC, Wet Prep HPF POC MODERATE (*)    All other components within normal limits  URINALYSIS, ROUTINE W REFLEX MICROSCOPIC (NOT AT [facility])  PREGNANCY, URINE  GC/CHLAMYDIA PROBE AMP ([place]) NOT AT [facility]    Imaging Review No results found.   I have personally reviewed and evaluated these lab results as part of my medical decision-making.   EKG Interpretation None      MDM   Final diagnoses:  Vaginal irritation  Back pain, unspecified location    49 year  old with back pain and vaginal irritation/itching x 2-3 days. Denies history of cancer, IVDU, anticoagulant use, bowel or bladder incontinence, saddle anesthesia, weakness. Denies vaginal discharge, dysuria, urgency, frequency.  Patient is afebrile. Vital signs stable. TTP of left lumbar paraspinal muscles. No midline tenderness, step-off, or deformity. Normal neuro exam with no focal neuro deficit. Strength, sensation, DTRs intact. Patient is able to ambulate without difficulty. Small amount of white vaginal discharge present in vaginal vault. No CMT or cervical friability, no TTP of adnexa or uterus.   UA negative for infection. Wet prep negative for yeast, trich, clue cells; reveals moderate WBC. GC/chlamydia pending.   Zofran given for nausea.  Vicodin  given for pain control.  Back pain likely related to muscle strain. Low suspicion for cauda equina, abscess, or hematoma. Will treat with pain medicine. Do not feel treatment for GC/chlamydia is indicated at this time given no complaint of vaginal discharge and benign pelvic exam. Patient understands cultures are pending. Patient to follow-up with PCP. Return precautions discussed.   BP 129/73 mmHg  Pulse 91  Temp(Src) 98.2 F (36.8 C) (Oral)  SpO2 100%  LMP 12/17/2014       Mady Gemma, PA-C 01/01/15 0232  Melene Plan, DO 01/01/15 [phone]

## 2015-01-01 LAB — GC/CHLAMYDIA PROBE AMP (~~LOC~~) NOT AT ARMC
Chlamydia: NEGATIVE
Neisseria Gonorrhea: NEGATIVE

## 2015-03-12 ENCOUNTER — Other Ambulatory Visit: Payer: Self-pay | Admitting: Pain Medicine

## 2015-03-12 ENCOUNTER — Ambulatory Visit
Admission: RE | Admit: 2015-03-12 | Discharge: 2015-03-12 | Disposition: A | Discharge status: Home / Self Care | Payer: Medicaid Other | Source: Ambulatory Visit | Attending: Pain Medicine | Admitting: Pain Medicine

## 2015-03-12 DIAGNOSIS — M79605 Pain in left leg: Secondary | ICD-10-CM

## 2015-03-12 DIAGNOSIS — G8929 Other chronic pain: Secondary | ICD-10-CM

## 2015-03-12 DIAGNOSIS — M545 Low back pain, unspecified: Secondary | ICD-10-CM

## 2015-07-28 ENCOUNTER — Other Ambulatory Visit: Payer: Self-pay | Admitting: Pain Medicine

## 2015-07-28 DIAGNOSIS — M545 Low back pain: Secondary | ICD-10-CM

## 2015-08-05 ENCOUNTER — Ambulatory Visit
Admission: RE | Admit: 2015-08-05 | Discharge: 2015-08-05 | Disposition: A | Discharge status: Home / Self Care | Payer: Medicaid Other | Source: Ambulatory Visit | Attending: Pain Medicine | Admitting: Pain Medicine

## 2015-08-05 DIAGNOSIS — M545 Low back pain: Secondary | ICD-10-CM

## 2015-08-16 ENCOUNTER — Encounter (HOSPITAL_COMMUNITY): Payer: Self-pay | Admitting: Emergency Medicine

## 2015-08-16 ENCOUNTER — Emergency Department (HOSPITAL_COMMUNITY)
Admission: EM | Admit: 2015-08-16 | Discharge: 2015-08-16 | Disposition: A | Discharge status: Home / Self Care | Payer: Medicaid Other | Attending: Emergency Medicine | Admitting: Emergency Medicine

## 2015-08-16 ENCOUNTER — Emergency Department (HOSPITAL_COMMUNITY): Payer: Medicaid Other

## 2015-08-16 DIAGNOSIS — S93402A Sprain of unspecified ligament of left ankle, initial encounter: Secondary | ICD-10-CM

## 2015-08-16 DIAGNOSIS — W108XXA Fall (on) (from) other stairs and steps, initial encounter: Secondary | ICD-10-CM | POA: Insufficient documentation

## 2015-08-16 DIAGNOSIS — Y9301 Activity, walking, marching and hiking: Secondary | ICD-10-CM | POA: Insufficient documentation

## 2015-08-16 DIAGNOSIS — S99912A Unspecified injury of left ankle, initial encounter: Secondary | ICD-10-CM | POA: Diagnosis present

## 2015-08-16 DIAGNOSIS — Z8639 Personal history of other endocrine, nutritional and metabolic disease: Secondary | ICD-10-CM | POA: Insufficient documentation

## 2015-08-16 DIAGNOSIS — F17210 Nicotine dependence, cigarettes, uncomplicated: Secondary | ICD-10-CM | POA: Diagnosis not present

## 2015-08-16 DIAGNOSIS — Y998 Other external cause status: Secondary | ICD-10-CM | POA: Diagnosis not present

## 2015-08-16 DIAGNOSIS — Y9289 Other specified places as the place of occurrence of the external cause: Secondary | ICD-10-CM | POA: Insufficient documentation

## 2015-08-16 DIAGNOSIS — Z8669 Personal history of other diseases of the nervous system and sense organs: Secondary | ICD-10-CM | POA: Diagnosis not present

## 2015-08-16 MED ORDER — HYDROCODONE-ACETAMINOPHEN 5-325 MG PO TABS: 1.0000 | ORAL_TABLET | Freq: Four times a day (QID) | ORAL | Status: DC | PRN

## 2015-08-16 MED ORDER — TRAMADOL HCL 50 MG PO TABS: 50.0000 mg | ORAL_TABLET | Freq: Four times a day (QID) | ORAL | Status: DC | PRN

## 2015-08-16 MED ORDER — HYDROCODONE-ACETAMINOPHEN 5-325 MG PO TABS
1.0000 | ORAL_TABLET | Freq: Once | ORAL | Status: AC
  Administered 2015-08-16: 1 via ORAL
  Filled 2015-08-16: qty 1

## 2015-08-16 NOTE — ED Notes (Signed)
Pt. slipped and fell yesterday while walking reports left ankle pain and left foot pain , ambulatory / denies LOC .

## 2015-08-16 NOTE — Discharge Instructions (Signed)
Do not take the narcotic if you are driving as it will make you sleepy.  °

## 2015-08-16 NOTE — ED Notes (Signed)
Patient verbalized understanding of discharge instructions and denies any further needs or questions at this time. VS stable. Patient ambulatory with steady gait.  

## 2015-08-16 NOTE — ED Provider Notes (Signed)
CSN: [id number]     Arrival date & time 08/16/15  2031 History   First MD Initiated Contact with Patient 08/16/15 2055     Chief Complaint  Patient presents with  . Ankle Injury     (Consider location/radiation/quality/duration/timing/severity/associated sxs/prior Treatment) Patient is a 50 y.o. female presenting with lower extremity injury. The history is provided by the patient. No language interpreter was used.  Ankle Injury This is a new problem. The current episode started yesterday. The problem occurs constantly. The problem has been gradually worsening.   [name] is a 50 y.o. female who presents to the ED wit left ankle and foot pain that started yesterday. Patient reports that she fell yesterday while walking and looking at a house. She was going down steps and turned her left ankle. She continues to have pain since the injury. She wrapped it with an ace bandage prior to arrival to the ED. She has taken nothing for pain.   Past Medical History  Diagnosis Date  . Hypercholesteremia   . Carpal tunnel syndrome    Past Surgical History  Procedure Laterality Date  . Cesarean section    . Cholecystectomy    . Carpal tunnel release     Family History  Problem Relation Age of Onset  . Cancer Mother   . Hypertension Mother   . Hypertension Father    Social History  Substance Use Topics  . Smoking status: Current Every Day Smoker -- 0.30 packs/day    Types: Cigarettes  . Smokeless tobacco: Never Used  . Alcohol Use: No   OB History    No data available     Review of Systems Negative except as stated in HPI   Allergies  Ibuprofen and Sulfa antibiotics  Home Medications   Prior to Admission medications   Medication Sig Start Date End Date Taking? Authorizing Provider  HYDROcodone-acetaminophen (NORCO) 5-325 MG tablet Take 1 tablet by mouth every 6 (six) hours as needed. 08/16/15   Hope Orlene Och, NP   BP 123/77 mmHg  Pulse 90  Temp(Src) 98.2 F (36.8 C)  (Oral)  Resp 18  Ht  (1.626 m)  Wt 87.544 kg  BMI 33.11 kg/m2  SpO2 99%  LMP 08/13/2015 Physical Exam  Constitutional: She is oriented to person, place, and time. She appears well-developed and well-nourished.  HENT:  Head: Normocephalic.  Eyes: EOM are normal.  Neck: Neck supple.  Cardiovascular: Normal rate.   Pulmonary/Chest: Effort normal.  Abdominal: Soft. There is no tenderness.  Musculoskeletal: Normal range of motion.       Left ankle: She exhibits swelling. She exhibits normal range of motion, no deformity, no laceration and normal pulse. Tenderness. Lateral malleolus tenderness found. Achilles tendon normal.  Pedal pulses 2+, adequate circulation, good touch sensation. Slight swelling to the lateral aspect noted.   Neurological: She is alert and oriented to person, place, and time. No cranial nerve deficit.  Skin: Skin is warm and dry.  Psychiatric: She has a normal mood and affect. Her behavior is normal.  Nursing note and vitals reviewed.   ED Course  Procedures (including critical care time) Labs Review Labs Reviewed - No data to display  Imaging Review Dg Ankle Complete Left  08/16/2015  CLINICAL DATA:  Left ankle pain after fall EXAM: LEFT ANKLE COMPLETE - 3+ VIEW COMPARISON:  None. FINDINGS: No fracture or malalignment. Tiny spur at the medial malleolus. Tiny Achilles and plantar left calcaneal spurs. IMPRESSION: No fracture or malalignment.  Mild  spurring as described. Electronically Signed   By: Delbert PhenixJason A Poff M.D.   On: 08/16/2015 21:22   I have personally reviewed and evaluated these images and lab results as part of my medical decision-making.   MDM  50 y.o. female with left ankle pain s/p injury yesterday stable for d/c without focal neuro deficits. ASO applied, patient declined crutches. Ice applied, patient instructed to elevate and f/u with ortho if symptoms persist.  Final diagnoses:  Ankle sprain, left, initial encounter       [facility] Neese,  NP 08/16/15 2153  Loren Raceravid Yelverton, MD 08/17/15 0000

## 2015-09-04 ENCOUNTER — Other Ambulatory Visit: Payer: Self-pay

## 2015-09-04 DIAGNOSIS — Z1231 Encounter for screening mammogram for malignant neoplasm of breast: Secondary | ICD-10-CM

## 2015-09-11 ENCOUNTER — Ambulatory Visit
Admission: RE | Admit: 2015-09-11 | Discharge: 2015-09-11 | Disposition: A | Discharge status: Home / Self Care | Payer: Medicaid Other | Source: Ambulatory Visit

## 2015-09-11 DIAGNOSIS — Z1231 Encounter for screening mammogram for malignant neoplasm of breast: Secondary | ICD-10-CM

## 2015-09-12 ENCOUNTER — Ambulatory Visit: Payer: Medicaid Other

## 2015-09-15 ENCOUNTER — Encounter (HOSPITAL_COMMUNITY): Payer: Self-pay | Admitting: *Deleted

## 2015-09-15 ENCOUNTER — Emergency Department (HOSPITAL_COMMUNITY)
Admission: EM | Admit: 2015-09-15 | Discharge: 2015-09-15 | Disposition: A | Discharge status: Home / Self Care | Payer: Medicaid Other | Attending: Emergency Medicine | Admitting: Emergency Medicine

## 2015-09-15 DIAGNOSIS — R0981 Nasal congestion: Secondary | ICD-10-CM

## 2015-09-15 DIAGNOSIS — R519 Headache, unspecified: Secondary | ICD-10-CM

## 2015-09-15 DIAGNOSIS — R05 Cough: Secondary | ICD-10-CM

## 2015-09-15 DIAGNOSIS — F17210 Nicotine dependence, cigarettes, uncomplicated: Secondary | ICD-10-CM | POA: Insufficient documentation

## 2015-09-15 DIAGNOSIS — R51 Headache: Secondary | ICD-10-CM | POA: Insufficient documentation

## 2015-09-15 DIAGNOSIS — Z8669 Personal history of other diseases of the nervous system and sense organs: Secondary | ICD-10-CM | POA: Diagnosis not present

## 2015-09-15 DIAGNOSIS — Z8639 Personal history of other endocrine, nutritional and metabolic disease: Secondary | ICD-10-CM | POA: Diagnosis not present

## 2015-09-15 DIAGNOSIS — R059 Cough, unspecified: Secondary | ICD-10-CM

## 2015-09-15 MED ORDER — SODIUM CHLORIDE 0.9 % IV BOLUS (SEPSIS)
1000.0000 mL | Freq: Once | INTRAVENOUS | Status: AC
  Administered 2015-09-15: 1000 mL via INTRAVENOUS

## 2015-09-15 MED ORDER — ACETAMINOPHEN 325 MG PO TABS: 650.0000 mg | ORAL_TABLET | Freq: Four times a day (QID) | ORAL | Status: DC | PRN

## 2015-09-15 MED ORDER — CETIRIZINE HCL 10 MG PO TABS: 10.0000 mg | ORAL_TABLET | Freq: Every day | ORAL | Status: DC

## 2015-09-15 MED ORDER — BENZONATATE 100 MG PO CAPS: 100.0000 mg | ORAL_CAPSULE | Freq: Three times a day (TID) | ORAL | Status: DC | PRN

## 2015-09-15 MED ORDER — FLUTICASONE PROPIONATE 50 MCG/ACT NA SUSP: 2.0000 | Freq: Every day | NASAL | Status: DC

## 2015-09-15 MED ORDER — KETOROLAC TROMETHAMINE 30 MG/ML IJ SOLN
30.0000 mg | Freq: Once | INTRAMUSCULAR | Status: AC
  Administered 2015-09-15: 30 mg via INTRAVENOUS
  Filled 2015-09-15: qty 1

## 2015-09-15 MED ORDER — DIPHENHYDRAMINE HCL 50 MG/ML IJ SOLN: 25.0000 mg | Freq: Once | INTRAMUSCULAR | Status: DC

## 2015-09-15 MED ORDER — DEXAMETHASONE SODIUM PHOSPHATE 10 MG/ML IJ SOLN
10.0000 mg | Freq: Once | INTRAMUSCULAR | Status: AC
  Administered 2015-09-15: 10 mg via INTRAVENOUS
  Filled 2015-09-15: qty 1

## 2015-09-15 MED ORDER — METOCLOPRAMIDE HCL 5 MG/ML IJ SOLN
10.0000 mg | Freq: Once | INTRAMUSCULAR | Status: AC
  Administered 2015-09-15: 10 mg via INTRAVENOUS
  Filled 2015-09-15: qty 2

## 2015-09-15 NOTE — ED Provider Notes (Signed)
CSN: [id number]     Arrival date & time 09/15/15  1540 History  By signing my name below, I, [name], attest that this documentation has been prepared under the direction and in the presence of Nancy Rex, PA-C. Electronically Signed: Octavia HeirArianna Nelson, ED Scribe. 09/15/2015. 5:12 PM.    Chief Complaint  Patient presents with  . Headache      The history is provided by the patient. No language interpreter was used.   HPI Comments: [name] is a 50 y.o. female who presents to the Emergency Department complaining of intermittent, gradual worsening, moderate headache onset 3 days ago. She has associated rhinorrhea and "blurry vision". Per pt, the she only has a headache after coughing. When she is not coughing, she does not have a headache. States her sinuses feel very congested as well. Pt has not taken any medication to alleviate her pain and notes she has not had this type of headache before. She denies fever, chills, hx of migraines, numbness, weakness, nausea, and vomiting.   Past Medical History  Diagnosis Date  . Hypercholesteremia   . Carpal tunnel syndrome    Past Surgical History  Procedure Laterality Date  . Cesarean section    . Cholecystectomy    . Carpal tunnel release     Family History  Problem Relation Age of Onset  . Cancer Mother   . Hypertension Mother   . Hypertension Father    Social History  Substance Use Topics  . Smoking status: Current Every Day Smoker -- 0.30 packs/day    Types: Cigarettes  . Smokeless tobacco: Never Used  . Alcohol Use: No   OB History    No data available     Review of Systems  A complete 10 system review of systems was obtained and all systems are negative except as noted in the HPI and PMH.    Allergies  Ibuprofen and Sulfa antibiotics  Home Medications   Prior to Admission medications   Medication Sig Start Date End Date Taking? Authorizing Provider  HYDROcodone-acetaminophen (NORCO) 5-325 MG tablet Take 1  tablet by mouth every 6 (six) hours as needed. 08/16/15   Nancy Orlene OchM Neese, NP   Triage vitals: BP 136/93 mmHg  Pulse 90  Temp(Src) 98.7 F (37.1 C) (Oral)  Resp 2  SpO2 98%  LMP 08/25/2015 Physical Exam  Constitutional: She is oriented to person, place, and time. She appears well-developed and well-nourished.  HENT:  Head: Normocephalic.  Right Ear: Tympanic membrane and external ear normal.  Left Ear: Tympanic membrane and external ear normal.  Maxillary sinus tenderness to palpation  Eyes: Conjunctivae and EOM are normal. Pupils are equal, round, and reactive to light.  Neck: Normal range of motion.  No meningismus  Pulmonary/Chest: Effort normal.  Abdominal: She exhibits no distension.  Musculoskeletal: Normal range of motion.  Neurological: She is alert and oriented to person, place, and time. No cranial nerve deficit.  Normal finger to nose No pronator drift Steady gait  Psychiatric: She has a normal mood and affect.  Nursing note and vitals reviewed.   ED Course  Procedures  DIAGNOSTIC STUDIES: Oxygen Saturation is 98% on RA, normal by my interpretation.  COORDINATION OF CARE:  5:11 PM Discussed treatment plan with pt at bedside and pt agreed to plan.  Labs Review Labs Reviewed - No data to display  Imaging Review No results found. I have personally reviewed and evaluated these images and lab results as part of my medical decision-making.  EKG Interpretation None      MDM   Final diagnoses:  Acute nonintractable headache, unspecified headache type  Sinus congestion  Cough    Given concomittant URI symptoms and sinus ttp suspect sinus headache increased while coughing. Doubt emergent intracranial process. Neuro exam intact. No fever or menigismus. HA resolved in the ED with migraine cocktail. Rx given for supportive meds for sinusitis/URI. ER return precautions given.  I personally performed the services described in this documentation, which was scribed  in my presence. The recorded information has been reviewed and is accurate.   Carlene Coria, Cordelia Poche 09/15/15 2122  Jacalyn Lefevre, MD 09/16/15 [phone]

## 2015-09-15 NOTE — Discharge Instructions (Signed)
Sinus Headache A sinus headache occurs when the paranasal sinuses become clogged or swollen. Paranasal sinuses are air pockets within the bones of the face. Sinus headaches can range from mild to severe. CAUSES A sinus headache can result from various conditions that affect the sinuses, such as:  Colds.  Sinus infections.  Allergies. SYMPTOMS The main symptom of this condition is a headache that may feel like pain or pressure in the face, forehead, ears, or upper teeth. People who have a sinus headache often have other symptoms, such as:  Congested or runny nose.  Fever.  Inability to smell. Weather changes can make symptoms worse. DIAGNOSIS This condition may be diagnosed based on:  A physical exam and medical history.  Imaging tests, such as a CT scan and MRI, to check for problems with the sinuses.  A specialist may look into the sinuses with a tool that has a camera (endoscopy). TREATMENT Treatment for this condition depends on the cause.  Sinus pain that is caused by a sinus infection may be treated with antibiotic medicine.  Sinus pain that is caused by allergies may be helped by allergy medicines (antihistamines) and medicated nasal sprays.  Sinus pain that is caused by congestion may be helped by flushing the nose and sinuses with saline solution. HOME CARE INSTRUCTIONS  Take medicines only as directed by your health care provider.  If you were prescribed an antibiotic medicine, finish all of it even if you start to feel better.  If you have congestion, use a nasal spray to help reduce pressure.  If directed, apply a warm, moist washcloth to your face to help relieve pain. SEEK MEDICAL CARE IF:  You have headaches more than one time each week.  You have sensitivity to light or sound.  You have a fever.  You feel sick to your stomach (nauseous) or you throw up (vomit).  Your headaches do not get better with treatment. Many people think that they have a  sinus headache when they actually have migraines or tension headaches. SEEK IMMEDIATE MEDICAL CARE IF:  You have vision problems.  You have sudden, severe pain in your face or head.  You have a seizure.  You are confused.  You have a stiff neck.   This information is not intended to replace advice given to you by your health care provider. Make sure you discuss any questions you have with your health care provider.   Document Released: 05/20/2004 Document Revised: 08/27/2014 Document Reviewed: 04/08/2014 Elsevier Interactive Patient Education 2016 [employer]  

## 2015-09-15 NOTE — ED Notes (Signed)
Pt c/o headache but only when she coughs.  Denies headache when not coughing.

## 2015-09-15 NOTE — ED Notes (Signed)
Pt reports having a headache for several days. Occurs more when coughing and clearing her throat. Denies n/v.denies hx of migraines.

## 2015-09-15 NOTE — ED Notes (Signed)
Pt st's she can't take Benadryl because it makes her jittery inside

## 2016-01-23 ENCOUNTER — Ambulatory Visit (INDEPENDENT_AMBULATORY_CARE_PROVIDER_SITE_OTHER): Payer: Medicaid Other

## 2016-01-23 ENCOUNTER — Encounter: Payer: Self-pay | Admitting: Podiatry

## 2016-01-23 ENCOUNTER — Ambulatory Visit (INDEPENDENT_AMBULATORY_CARE_PROVIDER_SITE_OTHER): Payer: Medicaid Other | Admitting: Podiatry

## 2016-01-23 VITALS — BP 170/106 | HR 97 | Resp 16 | Ht 63.0 in | Wt 190.0 lb

## 2016-01-23 DIAGNOSIS — M722 Plantar fascial fibromatosis: Secondary | ICD-10-CM | POA: Diagnosis not present

## 2016-01-23 DIAGNOSIS — M79672 Pain in left foot: Secondary | ICD-10-CM

## 2016-01-23 MED ORDER — TRIAMCINOLONE ACETONIDE 10 MG/ML IJ SUSP: 10.0000 mg | Freq: Once | INTRAMUSCULAR | Status: DC

## 2016-01-23 NOTE — Progress Notes (Signed)
   Subjective:    Patient ID: [name], female    DOB: [birth year], 50 y.o.   MRN: [id number]  HPI Chief Complaint  Patient presents with  . Foot Pain    Left foot; midfoot (near medial side); pt stated, "Has a knot on bottom of foot; sometimes it hurts"; x6 months      Review of Systems  Constitutional: Positive for fatigue and unexpected weight change.  HENT: Positive for sinus pressure.   Eyes: Positive for itching.  Respiratory: Positive for cough.   Gastrointestinal: Positive for abdominal distention and abdominal pain.  Endocrine: Positive for polyuria.  Genitourinary: Positive for urgency.  Musculoskeletal: Positive for arthralgias, back pain, gait problem and myalgias.  Neurological: Positive for weakness and numbness.  Psychiatric/Behavioral: The patient is nervous/anxious.   All other systems reviewed and are negative.      Objective:   Physical Exam        Assessment & Plan:

## 2016-01-23 NOTE — Progress Notes (Signed)
Subjective:     Patient ID: [name], female   DOB: [birth year], 50 y.o.   MRN: [id number]  HPI patient points the bottom the left foot and states she has a nodule with pain that she has noticed over the last 6 months. Does not think it's growing but it is mildly discomforting with certain activities   Review of Systems  All other systems reviewed and are negative.      Objective:   Physical Exam  Constitutional: She is oriented to person, place, and time.  Cardiovascular: Intact distal pulses.   Musculoskeletal: Normal range of motion.  Neurological: She is oriented to person, place, and time.  Skin: Skin is warm.  Nursing note and vitals reviewed.  neurovascular status intact muscle strength adequate range of motion within normal limits with patient found to have discomfort plantar aspect left mid arch area with a small nodule measuring about 5 x 5 mm that is painful when palpated with possible fluid accumulation. Patient's found have good digital perfusion and is well oriented 3     Assessment:     Inflammatory fasciitis with nodular formation left    Plan:     H&P condition reviewed education rendered and discussed that should grow in size turn colors or become more painful we may need to excise. Patient does not want excision at this time and I did go ahead and carefully injected around 3 Milligan Kenalog 5 mill grams Xylocaine to try to reduce any fasciitis-like symptoms and may be shrink any cystic element to the condition. Reappoint as needed  X-ray indicate mild depression of the arch with no indication of calcification

## 2016-03-04 ENCOUNTER — Ambulatory Visit (INDEPENDENT_AMBULATORY_CARE_PROVIDER_SITE_OTHER): Payer: Medicaid Other | Admitting: Specialist

## 2016-04-10 ENCOUNTER — Emergency Department (HOSPITAL_COMMUNITY)
Admission: EM | Admit: 2016-04-10 | Discharge: 2016-04-10 | Disposition: A | Discharge status: Home / Self Care | Payer: Medicaid Other | Attending: Emergency Medicine | Admitting: Emergency Medicine

## 2016-04-10 ENCOUNTER — Emergency Department (HOSPITAL_COMMUNITY): Payer: Medicaid Other

## 2016-04-10 ENCOUNTER — Encounter (HOSPITAL_COMMUNITY): Payer: Self-pay | Admitting: Emergency Medicine

## 2016-04-10 DIAGNOSIS — R05 Cough: Secondary | ICD-10-CM | POA: Diagnosis present

## 2016-04-10 DIAGNOSIS — Z79899 Other long term (current) drug therapy: Secondary | ICD-10-CM | POA: Insufficient documentation

## 2016-04-10 DIAGNOSIS — J069 Acute upper respiratory infection, unspecified: Secondary | ICD-10-CM | POA: Insufficient documentation

## 2016-04-10 DIAGNOSIS — F17210 Nicotine dependence, cigarettes, uncomplicated: Secondary | ICD-10-CM | POA: Insufficient documentation

## 2016-04-10 MED ORDER — GUAIFENESIN-CODEINE 100-10 MG/5ML PO SOLN: 10.0000 mL | Freq: Three times a day (TID) | ORAL | 0 refills | Status: DC | PRN

## 2016-04-10 MED ORDER — GUAIFENESIN-CODEINE 100-10 MG/5ML PO SOLN
10.0000 mL | Freq: Once | ORAL | Status: AC
  Administered 2016-04-10: 10 mL via ORAL
  Filled 2016-04-10: qty 10

## 2016-04-10 NOTE — ED Triage Notes (Signed)
Pt. Stated, I've had a cough and fever for 2 weeks., Nothing I take helps.

## 2016-04-10 NOTE — ED Provider Notes (Signed)
[facility]-EMERGENCY DEPT Provider Note   CSN: [id number] Arrival date & time: 04/10/16  1340     History   Chief Complaint Chief Complaint  Patient presents with  . Cough  . Fever    HPI Nancy Bundengela Ruark is a 50 y.o. female.  The history is provided by the patient and medical records. No language interpreter was used.  Cough  Associated symptoms include sore throat and myalgias. Pertinent negatives include no chest pain, no headaches, no shortness of breath and no wheezing.  Fever   Associated symptoms include sore throat and cough. Pertinent negatives include no chest pain, no vomiting and no headaches.   Nancy Bundengela Consalvo is a 50 y.o. female  with a PMH of HLD who presents to the Emergency Department complaining of persistent dry cough for the last 2 weeks associated with congestion and sore throat. Intermittent subjective fever. Denies shortness of breath or chest pain, but does endorse muscle aches around ribs from coughing. Denies any sick contacts.  She has tried TheraFlu with no relief. She also had left over prednisone from last year which she took 1 pill but unsure of dose.   Past Medical History:  Diagnosis Date  . Carpal tunnel syndrome   . Hypercholesteremia     There are no active problems to display for this patient.   Past Surgical History:  Procedure Laterality Date  . CARPAL TUNNEL RELEASE    . CESAREAN SECTION    . CHOLECYSTECTOMY      OB History    No data available       Home Medications    Prior to Admission medications   Medication Sig Start Date End Date Taking? Authorizing Provider  cetirizine (ZYRTEC ALLERGY) 10 MG tablet Take 1 tablet (10 mg total) by mouth daily. Patient taking differently: Take 10 mg by mouth as needed for allergies.  09/15/15  Yes Ace GinsSerena Y Sam, PA-C  hydrochlorothiazide (HYDRODIURIL) 25 MG tablet Take 25 mg by mouth daily. 04/05/16  Yes Historical Provider, MD  HYDROcodone-acetaminophen (NORCO) 5-325 MG tablet Take 1 tablet by  mouth every 6 (six) hours as needed. Patient taking differently: Take 1 tablet by mouth every 6 (six) hours as needed for moderate pain.  08/16/15  Yes Hope Orlene OchM Neese, NP  guaiFENesin-codeine 100-10 MG/5ML syrup Take 10 mLs by mouth 3 (three) times daily as needed for cough. 04/10/16   Chase PicketJaime Pilcher Ward, PA-C    Family History Family History  Problem Relation Age of Onset  . Cancer Mother   . Hypertension Mother   . Hypertension Father     Social History Social History  Substance Use Topics  . Smoking status: Current Every Day Smoker    Packs/day: 0.30    Types: Cigarettes  . Smokeless tobacco: Never Used  . Alcohol use No     Allergies   Ibuprofen; Benadryl [diphenhydramine]; and Sulfa antibiotics   Review of Systems Review of Systems  Constitutional: Positive for fever.  HENT: Positive for sore throat. Negative for trouble swallowing.   Respiratory: Positive for cough. Negative for shortness of breath and wheezing.   Cardiovascular: Negative for chest pain.  Gastrointestinal: Negative for abdominal pain and vomiting.  Genitourinary: Negative for dysuria.  Musculoskeletal: Positive for myalgias. Negative for neck pain and neck stiffness.  Skin: Negative for color change.  Allergic/Immunologic: Negative for immunocompromised state.  Neurological: Negative for headaches.     Physical Exam Updated Vital Signs BP (!) 124/108 (BP Location: Right Arm)   Pulse 97  Temp 98.8 F (37.1 C) (Oral)   Resp 18   Ht 5\' 3"  (1.6 m)   Wt 86.2 kg   LMP 03/18/2016   SpO2 99%   BMI 33.66 kg/m   Physical Exam  Constitutional: She is oriented to person, place, and time. She appears well-developed and well-nourished. No distress.  HENT:  Head: Normocephalic and atraumatic.  Oropharynx with erythema, but no exudates or tonsillar hypertrophy. No focal areas of sinus tenderness. TMs normal bilaterally.  Neck:  No midline or paraspinal tenderness. Full range of motion without pain.  No meningeal signs.  Cardiovascular: Normal rate, regular rhythm and normal heart sounds.   No murmur heard. Pulmonary/Chest: Effort normal and breath sounds normal. No respiratory distress. She has no wheezes. She has no rales. She exhibits no tenderness.  Abdominal: Soft. She exhibits no distension. There is no tenderness.  Neurological: She is alert and oriented to person, place, and time.  Skin: Skin is warm and dry.  Nursing note and vitals reviewed.    ED Treatments / Results  Labs (all labs ordered are listed, but only abnormal results are displayed) Labs Reviewed - No data to display  EKG  EKG Interpretation None       Radiology Dg Chest 2 View  Result Date: 04/10/2016 CLINICAL DATA:  Cough, congestion shortness of breath. EXAM: CHEST  2 VIEW COMPARISON:  05/21/2014 FINDINGS: The heart size and mediastinal contours are within normal limits. Both lungs are clear. The visualized skeletal structures are unremarkable. IMPRESSION: No active cardiopulmonary disease. Electronically Signed   By: Ted Mcalpineobrinka  Dimitrova M.D.   On: 04/10/2016 14:57    Procedures Procedures (including critical care time)  Medications Ordered in ED Medications  guaiFENesin-codeine 100-10 MG/5ML solution 10 mL (10 mLs Oral Given 04/10/16 1459)     Initial Impression / Assessment and Plan / ED Course  I have reviewed the triage vital signs and the nursing notes.  Pertinent labs & imaging results that were available during my care of the patient were reviewed by me and considered in my medical decision making (see chart for details).  Clinical Course    [name] is afebrile, non-toxic appearing with a clear lung exam. Mild rhinorrhea and OP with erythema, no exudates. Chest x-ray with no acute cardiopulmonary disease. Likely viral URI. Patient is agreeable to symptomatic treatment with close follow up with PCP as needed but spoke at length about emergent, changing, or worsening of symptoms  that should prompt return to ER. Patient voices understanding and is agreeable to plan.   Final Clinical Impressions(s) / ED Diagnoses   Final diagnoses:  Upper respiratory tract infection, unspecified type    New Prescriptions New Prescriptions   GUAIFENESIN-CODEINE 100-10 MG/5ML SYRUP    Take 10 mLs by mouth 3 (three) times daily as needed for cough.     [facility] County HospitalJaime Pilcher Ward, PA-C 04/10/16 1538    Cathren LaineKevin Steinl, MD 04/11/16 [phone]

## 2016-04-10 NOTE — Discharge Instructions (Signed)
Cough syrup only as needed - This can make you drowsy - please do not drink alcohol, operate heavy machinery or drive on this medication. Increase hydration. Follow up with your primary care provider if her symptoms do not improve in 1 week. Return to ER for new or worsening symptoms, any additional concerns.

## 2016-04-10 NOTE — ED Notes (Signed)
Pt. Given drink with EDP approval.  

## 2016-04-10 NOTE — ED Notes (Signed)
Patient transported to X-ray 

## 2016-05-06 ENCOUNTER — Ambulatory Visit (INDEPENDENT_AMBULATORY_CARE_PROVIDER_SITE_OTHER): Payer: Medicaid Other | Admitting: Specialist

## 2016-06-16 ENCOUNTER — Emergency Department (HOSPITAL_COMMUNITY)
Admission: EM | Admit: 2016-06-16 | Discharge: 2016-06-16 | Disposition: A | Discharge status: Home / Self Care | Payer: Medicaid Other | Attending: Emergency Medicine | Admitting: Emergency Medicine

## 2016-06-16 ENCOUNTER — Encounter (HOSPITAL_COMMUNITY): Payer: Self-pay | Admitting: Emergency Medicine

## 2016-06-16 ENCOUNTER — Emergency Department (HOSPITAL_COMMUNITY): Payer: Medicaid Other

## 2016-06-16 DIAGNOSIS — I10 Essential (primary) hypertension: Secondary | ICD-10-CM | POA: Insufficient documentation

## 2016-06-16 DIAGNOSIS — R05 Cough: Secondary | ICD-10-CM | POA: Diagnosis not present

## 2016-06-16 DIAGNOSIS — R059 Cough, unspecified: Secondary | ICD-10-CM

## 2016-06-16 DIAGNOSIS — F17210 Nicotine dependence, cigarettes, uncomplicated: Secondary | ICD-10-CM | POA: Insufficient documentation

## 2016-06-16 DIAGNOSIS — R6889 Other general symptoms and signs: Secondary | ICD-10-CM

## 2016-06-16 HISTORY — DX: Essential (primary) hypertension: I10

## 2016-06-16 MED ORDER — BENZONATATE 100 MG PO CAPS: 100.0000 mg | ORAL_CAPSULE | Freq: Three times a day (TID) | ORAL | 0 refills | Status: DC

## 2016-06-16 MED ORDER — AZITHROMYCIN 250 MG PO TABS: ORAL_TABLET | ORAL | 0 refills | Status: DC

## 2016-06-16 MED ORDER — FLUCONAZOLE 150 MG PO TABS: 150.0000 mg | ORAL_TABLET | Freq: Once | ORAL | 0 refills | Status: AC

## 2016-06-16 NOTE — Discharge Instructions (Signed)
Please take antibiotic and cough medication as prescribed. Follow up with your doctor for further care.  If you develop yeast infection then take Diflucan once.

## 2016-06-16 NOTE — ED Provider Notes (Signed)
[facility] Provider Note   CSN: [id number] Arrival date & time: 06/16/16  1159  By signing my name below, I, Teofilo Pod, attest that this documentation has been prepared under the direction and in the presence of Fayrene Helper, PA-C. Electronically Signed: Teofilo Pod, ED Scribe. 06/16/2016. 1:08 PM.    History   Chief Complaint Chief Complaint  Patient presents with  . Cough  . Fever  . Shortness of Breath  . Chest Pain   The history is provided by the patient. No language interpreter was used.   HPI Comments:  [name] is a 51 y.o. female who presents to the Emergency Department complaining of a persistent cough x 2 months. Pt complains of associated fever, body aches, SOB, sore throat. Pt is a smoker. Pt notes a known allergy to ibuprofen and benadryl. Pt got a flu shot last fall. Pt has taken Mucinex, delsym, tylenol with no relief. Pt denies other associated symptoms.   PCP: Norm Salt, PA   Past Medical History:  Diagnosis Date  . Carpal tunnel syndrome   . Hypercholesteremia   . Hypertension     There are no active problems to display for this patient.   Past Surgical History:  Procedure Laterality Date  . CARPAL TUNNEL RELEASE    . CESAREAN SECTION    . CHOLECYSTECTOMY      OB History    No data available       Home Medications    Prior to Admission medications   Medication Sig Start Date End Date Taking? Authorizing Provider  cetirizine (ZYRTEC ALLERGY) 10 MG tablet Take 1 tablet (10 mg total) by mouth daily. Patient taking differently: Take 10 mg by mouth as needed for allergies.  09/15/15   Ace Gins Sam, PA-C  guaiFENesin-codeine 100-10 MG/5ML syrup Take 10 mLs by mouth 3 (three) times daily as needed for cough. 04/10/16   Chase Picket Ward, PA-C  hydrochlorothiazide (HYDRODIURIL) 25 MG tablet Take 25 mg by mouth daily. 04/05/16   Historical Provider, MD  HYDROcodone-acetaminophen (NORCO) 5-325 MG tablet Take 1  tablet by mouth every 6 (six) hours as needed. Patient taking differently: Take 1 tablet by mouth every 6 (six) hours as needed for moderate pain.  08/16/15   Hope Orlene Och, NP    Family History Family History  Problem Relation Age of Onset  . Cancer Mother   . Hypertension Mother   . Hypertension Father     Social History Social History  Substance Use Topics  . Smoking status: Current Every Day Smoker    Packs/day: 0.30    Types: Cigarettes  . Smokeless tobacco: Never Used  . Alcohol use No     Allergies   Ibuprofen; Benadryl [diphenhydramine]; and Sulfa antibiotics   Review of Systems Review of Systems  Constitutional: Positive for fever.  HENT: Positive for sore throat.   Respiratory: Positive for cough and shortness of breath.   Genitourinary: Positive for flank pain.     Physical Exam Updated Vital Signs BP 111/70   Pulse 85   Temp 98.9 F (37.2 C) (Oral)   Resp 20   LMP 05/18/2016 (Exact Date)   SpO2 98%   Physical Exam  Constitutional: She appears well-developed and well-nourished. No distress.  HENT:  Head: Normocephalic and atraumatic.  Voice is hoarse. Trachea midline.  Eyes: Conjunctivae are normal.  Cardiovascular: Normal rate, regular rhythm and normal heart sounds.   Pulmonary/Chest: Effort normal and breath sounds normal.  Abdominal:  She exhibits no distension.  Neurological: She is alert.  Skin: Skin is warm and dry.  Psychiatric: She has a normal mood and affect.  Nursing note and vitals reviewed.    ED Treatments / Results  DIAGNOSTIC STUDIES:  Oxygen Saturation is 98% on RA, normal by my interpretation.    COORDINATION OF CARE:  1:08 PM Will prescribe antibiotics. Discussed treatment plan with pt at bedside and pt agreed to plan.   Labs (all labs ordered are listed, but only abnormal results are displayed) Labs Reviewed - No data to display  EKG  EKG Interpretation None       Radiology Dg Chest 2 View  Result Date:  06/16/2016 CLINICAL DATA:  Cough and fever.  Shortness of breath. EXAM: CHEST  2 VIEW COMPARISON:  April 10, 2016 FINDINGS: There is no edema or consolidation. Heart size and pulmonary vascularity are normal. No adenopathy. No bone lesions. IMPRESSION: No edema or consolidation. Electronically Signed   By: Bretta BangWilliam  Woodruff III M.D.   On: 06/16/2016 12:47    Procedures Procedures (including critical care time)  Medications Ordered in ED Medications - No data to display   Initial Impression / Assessment and Plan / ED Course  I have reviewed the triage vital signs and the nursing notes.  Pertinent labs & imaging results that were available during my care of the patient were reviewed by me and considered in my medical decision making (see chart for details).     BP 111/70   Pulse 85   Temp 98.9 F (37.2 C) (Oral)   Resp 20   LMP 05/18/2016 (Exact Date)   SpO2 98%    Final Clinical Impressions(s) / ED Diagnoses   Final diagnoses:  Cough  Flu-like symptoms    New Prescriptions Discharge Medication List as of 06/16/2016  1:15 PM    START taking these medications   Details  azithromycin (ZITHROMAX Z-PAK) 250 MG tablet 2 po day one, then 1 daily x 4 days, Print    benzonatate (TESSALON) 100 MG capsule Take 1 capsule (100 mg total) by mouth every 8 (eight) hours., Starting Wed 06/16/2016, Print    fluconazole (DIFLUCAN) 150 MG tablet Take 1 tablet (150 mg total) by mouth once., Starting Wed 06/16/2016, Print       I personally performed the services described in this documentation, which was scribed in my presence. The recorded information has been reviewed and is accurate.   1:37 PM Pt here with cold sxs however due to the prolonged course of her sickness, a zpak was prescribed to cover for atypical pna or superimposed bacterial infection.  cxr unremarkable.  Return precaution given.     Fayrene HelperBowie Enes Rokosz, PA-C 06/16/16 1337    Arby BarretteMarcy Pfeiffer, MD 06/18/16 [phone]

## 2016-06-16 NOTE — ED Triage Notes (Addendum)
Cp from coughing so hard ,  fever x 1 m onth , has tried otc meds not helping

## 2016-08-04 ENCOUNTER — Ambulatory Visit (INDEPENDENT_AMBULATORY_CARE_PROVIDER_SITE_OTHER): Payer: Medicaid Other | Admitting: Physician Assistant

## 2016-08-04 ENCOUNTER — Encounter (INDEPENDENT_AMBULATORY_CARE_PROVIDER_SITE_OTHER): Payer: Self-pay | Admitting: Physician Assistant

## 2016-08-04 VITALS — BP 158/88 | HR 75 | Temp 98.1°F | Ht 63.5 in | Wt 191.8 lb

## 2016-08-04 DIAGNOSIS — I10 Essential (primary) hypertension: Secondary | ICD-10-CM | POA: Diagnosis not present

## 2016-08-04 DIAGNOSIS — M5442 Lumbago with sciatica, left side: Secondary | ICD-10-CM | POA: Diagnosis not present

## 2016-08-04 DIAGNOSIS — N951 Menopausal and female climacteric states: Secondary | ICD-10-CM

## 2016-08-04 MED ORDER — PROGESTERONE MICRONIZED 200 MG PO CAPS: 200.0000 mg | ORAL_CAPSULE | Freq: Every day | ORAL | 11 refills | Status: DC

## 2016-08-04 MED ORDER — HYDROCHLOROTHIAZIDE 25 MG PO TABS: 25.0000 mg | ORAL_TABLET | Freq: Every day | ORAL | 1 refills | Status: DC

## 2016-08-04 MED ORDER — ACETAMINOPHEN-CODEINE #3 300-30 MG PO TABS: 1.0000 | ORAL_TABLET | Freq: Four times a day (QID) | ORAL | 0 refills | Status: AC | PRN

## 2016-08-04 MED ORDER — ESTRADIOL 0.025 MG/24HR TD PTTW: 1.0000 | MEDICATED_PATCH | TRANSDERMAL | 11 refills | Status: DC

## 2016-08-04 NOTE — Progress Notes (Signed)
New patient presents with hypertension and menopause concerns Pt states she has pain in her left hip, describes pain as throbbing=8 Pt states the pain shoots down her leg

## 2016-08-04 NOTE — Progress Notes (Signed)
Subjective:  Patient ID: [name], female    DOB: 09/20/1965  Age: 51 y.o. MRN: [id number]  CC: HTN  HPI [name] is a 51 y.o. female with a PMH of HTN presents to establish care for her HTN. Concerned about HTN due to not having HCTZ available. However, she does not know if HCTZ actually worked for her because she did not take her BP for the month that she was on it.    Also wants to address her menopausal symptoms. In regards to menopause, she states she has not had a period since December, she is irritable, and has a loss of libido. Associated vaginal dryness, constant, and hot flashes.    Lastly, wants to address left back pain, onset one to two weeks ago. Described as throbbing in the lumbar paraspinals that radiate to the left hamstring. Not attributable to anything in particular.    Review of Systems  Constitutional: Negative for chills, fever and malaise/fatigue.       Hot flashes  Eyes: Negative for blurred vision.  Respiratory: Negative for shortness of breath.   Cardiovascular: Negative for chest pain and palpitations.  Gastrointestinal: Negative for abdominal pain and nausea.  Genitourinary: Negative for dysuria and hematuria.  Musculoskeletal: Negative for joint pain and myalgias.  Skin: Negative for rash.  Neurological: Negative for tingling and headaches.  Psychiatric/Behavioral: Negative for depression. The patient is not nervous/anxious.        Irritable mood    Objective:  BP (!) 158/88 (BP Location: Right Arm, Patient Position: Sitting, Cuff Size: Large)   Pulse 75   Temp 98.1 F (36.7 C) (Oral)   Ht 5' 3.5" (1.613 m)   Wt 191 lb 12.8 oz (87 kg)   LMP 04/07/2016 (Approximate)   SpO2 99%   BMI 33.44 kg/m   BP/Weight 08/04/2016 06/16/2016 04/10/2016  Systolic BP 158 111 124  Diastolic BP 88 70 108  Wt. (Lbs) 191.8 - 190  BMI 33.44 - 33.66      Physical Exam  Constitutional: She is oriented to person, place, and time.  Well developed, obese,  NAD  HENT:  Head: Normocephalic and atraumatic.  Eyes: No scleral icterus.  Neck: Normal range of motion. Neck supple. No thyromegaly present.  Cardiovascular: Normal rate, regular rhythm and normal heart sounds.   Pulmonary/Chest: Effort normal and breath sounds normal.  Musculoskeletal: She exhibits no edema.  Full aROM of the back  Neurological: She is alert and oriented to person, place, and time. She has normal reflexes. No cranial nerve deficit. Coordination normal.  SLR mildly positive on left side, toe walking aggravates left sided back pain. Strength 5/5 throughout.  Skin: Skin is warm and dry. No rash noted. No erythema. No pallor.  Psychiatric: Her behavior is normal. Thought content normal.  Irritable  Vitals reviewed.    Assessment & Plan:   1. Peri-menopausal - TSH - PT AND PTT - CBC with Differential/Platelet - Comprehensive metabolic panel  2. Hypertension, unspecified type - hydrochlorothiazide (HYDRODIURIL) 25 MG tablet; Take 1 tablet (25 mg total) by mouth daily.  Dispense: 90 tablet; Refill: 1 - TSH - PT AND PTT - CBC with Differential/Platelet - Comprehensive metabolic panel  3. Acute left-sided low back pain with left-sided sciatica - acetaminophen-codeine (TYLENOL #3) 300-30 MG tablet; Take 1 tablet by mouth every 6 (six) hours as needed for moderate pain.  Dispense: 40 tablet; Refill: 0 - DG Lumbar Spine Complete  4. Climacteric - progesterone (PROMETRIUM) 200 MG capsule; Take 1  capsule (200 mg total) by mouth daily.  Dispense: 12 capsule; Refill: 11 - estradiol (VIVELLE-DOT) 0.025 MG/24HR; Place 1 patch onto the skin 2 (two) times a week.  Dispense: 8 patch; Refill: 11  Meds ordered this encounter  Medications  . progesterone (PROMETRIUM) 200 MG capsule    Sig: Take 1 capsule (200 mg total) by mouth daily.    Dispense:  12 capsule    Refill:  11    Order Specific Question:   Supervising Provider    Answer:   Quentin Angst [id number]  .  estradiol (VIVELLE-DOT) 0.025 MG/24HR    Sig: Place 1 patch onto the skin 2 (two) times a week.    Dispense:  8 patch    Refill:  11    Order Specific Question:   Supervising Provider    Answer:   Quentin Angst [id number]  . hydrochlorothiazide (HYDRODIURIL) 25 MG tablet    Sig: Take 1 tablet (25 mg total) by mouth daily.    Dispense:  90 tablet    Refill:  1    Order Specific Question:   Supervising Provider    Answer:   Quentin Angst [id number]  . acetaminophen-codeine (TYLENOL #3) 300-30 MG tablet    Sig: Take 1 tablet by mouth every 6 (six) hours as needed for moderate pain.    Dispense:  40 tablet    Refill:  0    Order Specific Question:   Supervising Provider    Answer:   Quentin Angst [id number]    Follow-up: Return in about 4 weeks (around 09/01/2016) for HTN, perimenopause.   Loletta Specter PA

## 2016-08-04 NOTE — Patient Instructions (Signed)
DASH Eating Plan DASH stands for "Dietary Approaches to Stop Hypertension." The DASH eating plan is a healthy eating plan that has been shown to reduce high blood pressure (hypertension). It may also reduce your risk for type 2 diabetes, heart disease, and stroke. The DASH eating plan may also help with weight loss. What are tips for following this plan? General guidelines   Avoid eating more than 2,300 mg (milligrams) of salt (sodium) a day. If you have hypertension, you may need to reduce your sodium intake to 1,500 mg a day.  Limit alcohol intake to no more than 1 drink a day for nonpregnant women and 2 drinks a day for men. One drink equals 12 oz of beer, 5 oz of wine, or 1 oz of hard liquor.  Work with your health care provider to maintain a healthy body weight or to lose weight. Ask what an ideal weight is for you.  Get at least 30 minutes of exercise that causes your heart to beat faster (aerobic exercise) most days of the week. Activities may include walking, swimming, or biking.  Work with your health care provider or diet and nutrition specialist (dietitian) to adjust your eating plan to your individual calorie needs. Reading food labels   Check food labels for the amount of sodium per serving. Choose foods with less than 5 percent of the Daily Value of sodium. Generally, foods with less than 300 mg of sodium per serving fit into this eating plan.  To find whole grains, look for the word "whole" as the first word in the ingredient list. Shopping   Buy products labeled as "low-sodium" or "no salt added."  Buy fresh foods. Avoid canned foods and premade or frozen meals. Cooking   Avoid adding salt when cooking. Use salt-free seasonings or herbs instead of table salt or sea salt. Check with your health care provider or pharmacist before using salt substitutes.  Do not fry foods. Cook foods using healthy methods such as baking, boiling, grilling, and broiling instead.  Cook with  heart-healthy oils, such as olive, canola, soybean, or sunflower oil. Meal planning    Eat a balanced diet that includes:  5 or more servings of fruits and vegetables each day. At each meal, try to fill half of your plate with fruits and vegetables.  Up to 6-8 servings of whole grains each day.  Less than 6 oz of lean meat, poultry, or fish each day. A 3-oz serving of meat is about the same size as a deck of cards. One egg equals 1 oz.  2 servings of low-fat dairy each day.  A serving of nuts, seeds, or beans 5 times each week.  Heart-healthy fats. Healthy fats called Omega-3 fatty acids are found in foods such as flaxseeds and coldwater fish, like sardines, salmon, and mackerel.  Limit how much you eat of the following:  Canned or prepackaged foods.  Food that is high in trans fat, such as fried foods.  Food that is high in saturated fat, such as fatty meat.  Sweets, desserts, sugary drinks, and other foods with added sugar.  Full-fat dairy products.  Do not salt foods before eating.  Try to eat at least 2 vegetarian meals each week.  Eat more home-cooked food and less restaurant, buffet, and fast food.  When eating at a restaurant, ask that your food be prepared with less salt or no salt, if possible. What foods are recommended? The items listed may not be a complete list. Talk   with your dietitian about what dietary choices are best for you. Grains  Whole-grain or whole-wheat bread. Whole-grain or whole-wheat pasta. Brown rice. Oatmeal. Quinoa. Bulgur. Whole-grain and low-sodium cereals. Pita bread. Low-fat, low-sodium crackers. Whole-wheat flour tortillas. Vegetables  Fresh or frozen vegetables (raw, steamed, roasted, or grilled). Low-sodium or reduced-sodium tomato and vegetable juice. Low-sodium or reduced-sodium tomato sauce and tomato paste. Low-sodium or reduced-sodium canned vegetables. Fruits  All fresh, dried, or frozen fruit. Canned fruit in natural juice  (without added sugar). Meat and other protein foods  Skinless chicken or turkey. Ground chicken or turkey. Pork with fat trimmed off. Fish and seafood. Egg whites. Dried beans, peas, or lentils. Unsalted nuts, nut butters, and seeds. Unsalted canned beans. Lean cuts of beef with fat trimmed off. Low-sodium, lean deli meat. Dairy  Low-fat (1%) or fat-free (skim) milk. Fat-free, low-fat, or reduced-fat cheeses. Nonfat, low-sodium ricotta or cottage cheese. Low-fat or nonfat yogurt. Low-fat, low-sodium cheese. Fats and oils  Soft margarine without trans fats. Vegetable oil. Low-fat, reduced-fat, or light mayonnaise and salad dressings (reduced-sodium). Canola, safflower, olive, soybean, and sunflower oils. Avocado. Seasoning and other foods  Herbs. Spices. Seasoning mixes without salt. Unsalted popcorn and pretzels. Fat-free sweets. What foods are not recommended? The items listed may not be a complete list. Talk with your dietitian about what dietary choices are best for you. Grains  Baked goods made with fat, such as croissants, muffins, or some breads. Dry pasta or rice meal packs. Vegetables  Creamed or fried vegetables. Vegetables in a cheese sauce. Regular canned vegetables (not low-sodium or reduced-sodium). Regular canned tomato sauce and paste (not low-sodium or reduced-sodium). Regular tomato and vegetable juice (not low-sodium or reduced-sodium). Pickles. Olives. Fruits  Canned fruit in a light or heavy syrup. Fried fruit. Fruit in cream or butter sauce. Meat and other protein foods  Fatty cuts of meat. Ribs. Fried meat. Bacon. Sausage. Bologna and other processed lunch meats. Salami. Fatback. Hotdogs. Bratwurst. Salted nuts and seeds. Canned beans with added salt. Canned or smoked fish. Whole eggs or egg yolks. Chicken or turkey with skin. Dairy  Whole or 2% milk, cream, and half-and-half. Whole or full-fat cream cheese. Whole-fat or sweetened yogurt. Full-fat cheese. Nondairy creamers.  Whipped toppings. Processed cheese and cheese spreads. Fats and oils  Butter. Stick margarine. Lard. Shortening. Ghee. Bacon fat. Tropical oils, such as coconut, palm kernel, or palm oil. Seasoning and other foods  Salted popcorn and pretzels. Onion salt, garlic salt, seasoned salt, table salt, and sea salt. Worcestershire sauce. Tartar sauce. Barbecue sauce. Teriyaki sauce. Soy sauce, including reduced-sodium. Steak sauce. Canned and packaged gravies. Fish sauce. Oyster sauce. Cocktail sauce. Horseradish that you find on the shelf. Ketchup. Mustard. Meat flavorings and tenderizers. Bouillon cubes. Hot sauce and Tabasco sauce. Premade or packaged marinades. Premade or packaged taco seasonings. Relishes. Regular salad dressings. Where to find more information:  National Heart, Lung, and Blood Institute: www.nhlbi.nih.gov  American Heart Association: www.heart.org Summary  The DASH eating plan is a healthy eating plan that has been shown to reduce high blood pressure (hypertension). It may also reduce your risk for type 2 diabetes, heart disease, and stroke.  With the DASH eating plan, you should limit salt (sodium) intake to 2,300 mg a day. If you have hypertension, you may need to reduce your sodium intake to 1,500 mg a day.  When on the DASH eating plan, aim to eat more fresh fruits and vegetables, whole grains, lean proteins, low-fat dairy, and heart-healthy fats.  Work   with your health care provider or diet and nutrition specialist (dietitian) to adjust your eating plan to your individual calorie needs. This information is not intended to replace advice given to you by your health care provider. Make sure you discuss any questions you have with your health care provider. Document Released: 04/01/2011 Document Revised: 04/05/2016 Document Reviewed: 04/05/2016 Elsevier Interactive Patient Education  2017 [employer] Perimenopause Perimenopause is the time when your body begins to move into  the menopause (no menstrual period for 12 straight months). It is a natural process. Perimenopause can begin 2-8 years before the menopause and usually lasts for 1 year after the menopause. During this time, your ovaries may or may not produce an egg. The ovaries vary in their production of estrogen and progesterone hormones each month. This can cause irregular menstrual periods, difficulty getting pregnant, vaginal bleeding between periods, and uncomfortable symptoms. What are the causes?  Irregular production of the ovarian hormones, estrogen and progesterone, and not ovulating every month. Other causes include:  Tumor of the pituitary gland in the brain.  Medical disease that affects the ovaries.  Radiation treatment.  Chemotherapy.  Unknown causes.  Heavy smoking and excessive alcohol intake can bring on perimenopause sooner. What are the signs or symptoms?  Hot flashes.  Night sweats.  Irregular menstrual periods.  Decreased sex drive.  Vaginal dryness.  Headaches.  Mood swings.  Depression.  Memory problems.  Irritability.  Tiredness.  Weight gain.  Trouble getting pregnant.  The beginning of losing bone cells (osteoporosis).  The beginning of hardening of the arteries (atherosclerosis). How is this diagnosed? Your health care provider will make a diagnosis by analyzing your age, menstrual history, and symptoms. He or she will do a physical exam and note any changes in your body, especially your female organs. Female hormone tests may or may not be helpful depending on the amount of female hormones you produce and when you produce them. However, other hormone tests may be helpful to rule out other problems. How is this treated? In some cases, no treatment is needed. The decision on whether treatment is necessary during the perimenopause should be made by you and your health care provider based on how the symptoms are affecting you and your lifestyle. Various  treatments are available, such as:  Treating individual symptoms with a specific medicine for that symptom.  Herbal medicines that can help specific symptoms.  Counseling.  Group therapy. Follow these instructions at home:  Keep track of your menstrual periods (when they occur, how heavy they are, how long between periods, and how long they last) as well as your symptoms and when they started.  Only take over-the-counter or prescription medicines as directed by your health care provider.  Sleep and rest.  Exercise.  Eat a diet that contains calcium (good for your bones) and soy (acts like the estrogen hormone).  Do not smoke.  Avoid alcoholic beverages.  Take vitamin supplements as recommended by your health care provider. Taking vitamin E may help in certain cases.  Take calcium and vitamin D supplements to help prevent bone loss.  Group therapy is sometimes helpful.  Acupuncture may help in some cases. Contact a health care provider if:  You have questions about any symptoms you are having.  You need a referral to a specialist (gynecologist, psychiatrist, or psychologist). Get help right away if:  You have vaginal bleeding.  Your period lasts longer than 8 days.  Your periods are recurring sooner than 21 days.  You have bleeding after intercourse.  You have severe depression.  You have pain when you urinate.  You have severe headaches.  You have vision problems. This information is not intended to replace advice given to you by your health care provider. Make sure you discuss any questions you have with your health care provider. Document Released: 05/20/2004 Document Revised: 09/18/2015 Document Reviewed: 11/09/2012 Elsevier Interactive Patient Education  2017 [employer].

## 2016-08-05 ENCOUNTER — Telehealth (INDEPENDENT_AMBULATORY_CARE_PROVIDER_SITE_OTHER): Payer: Self-pay | Admitting: Physician Assistant

## 2016-08-05 LAB — CBC WITH DIFFERENTIAL/PLATELET
BASOS ABS: 0 10*3/uL (ref 0.0–0.2)
Basos: 0 %
EOS (ABSOLUTE): 0 10*3/uL (ref 0.0–0.4)
Eos: 1 %
HEMOGLOBIN: 11.3 g/dL (ref 11.1–15.9)
Hematocrit: 36.7 % (ref 34.0–46.6)
IMMATURE GRANS (ABS): 0 10*3/uL (ref 0.0–0.1)
Immature Granulocytes: 0 %
LYMPHS: 34 %
Lymphocytes Absolute: 1.9 10*3/uL (ref 0.7–3.1)
MCH: 27.2 pg (ref 26.6–33.0)
MCHC: 30.8 g/dL — AB (ref 31.5–35.7)
MCV: 88 fL (ref 79–97)
MONOCYTES: 7 %
Monocytes Absolute: 0.4 10*3/uL (ref 0.1–0.9)
NEUTROS ABS: 3.4 10*3/uL (ref 1.4–7.0)
Neutrophils: 58 %
Platelets: 265 10*3/uL (ref 150–379)
RBC: 4.16 x10E6/uL (ref 3.77–5.28)
RDW: 16.5 % — ABNORMAL HIGH (ref 12.3–15.4)
WBC: 5.8 10*3/uL (ref 3.4–10.8)

## 2016-08-05 LAB — TSH: TSH: 1.19 u[IU]/mL (ref 0.450–4.500)

## 2016-08-05 LAB — COMPREHENSIVE METABOLIC PANEL
ALBUMIN: 3.6 g/dL (ref 3.5–5.5)
ALK PHOS: 42 IU/L (ref 39–117)
ALT: 13 IU/L (ref 0–32)
AST: 14 IU/L (ref 0–40)
Albumin/Globulin Ratio: 2 (ref 1.2–2.2)
BILIRUBIN TOTAL: 0.2 mg/dL (ref 0.0–1.2)
BUN / CREAT RATIO: 14 (ref 9–23)
BUN: 11 mg/dL (ref 6–24)
CHLORIDE: 105 mmol/L (ref 96–106)
CO2: 27 mmol/L (ref 18–29)
Calcium: 9 mg/dL (ref 8.7–10.2)
Creatinine, Ser: 0.76 mg/dL (ref 0.57–1.00)
GFR calc Af Amer: 107 mL/min/{1.73_m2} (ref >59)
GFR calc non Af Amer: 92 mL/min/{1.73_m2} (ref >59)
GLOBULIN, TOTAL: 1.8 g/dL (ref 1.5–4.5)
GLUCOSE: 81 mg/dL (ref 65–99)
Potassium: 4.2 mmol/L (ref 3.5–5.2)
SODIUM: 144 mmol/L (ref 134–144)
TOTAL PROTEIN: 5.4 g/dL — AB (ref 6.0–8.5)

## 2016-08-05 LAB — PT AND PTT
APTT: 25 s (ref 24–33)
INR: 1 (ref 0.8–1.2)
Prothrombin Time: 10.2 s (ref 9.1–12.0)

## 2016-08-05 NOTE — Telephone Encounter (Signed)
I tried [employer] again at [email or web address] but was once again directed to call [employer]. [employer] told me that patient does not have active medicaid and this is the reason for the rejection. I then called patient and told her [employer] is stating she does not have active Medicaid. I shared the [employer] number with her and she stated that she called the same number yesterday and was told she has active Medicaid. She said she will call again. I told her that I will be willing to send her HCTZ to [facility] so that she may begin to lower her blood pressure. I told her HCTZ is $4.00 at [facility] She said she will wait and see what for her pharmacy says because they have HCTZ for $4.00 also.

## 2016-08-12 ENCOUNTER — Telehealth (INDEPENDENT_AMBULATORY_CARE_PROVIDER_SITE_OTHER): Payer: Self-pay | Admitting: Physician Assistant

## 2016-08-12 ENCOUNTER — Ambulatory Visit (INDEPENDENT_AMBULATORY_CARE_PROVIDER_SITE_OTHER): Payer: Medicaid Other | Admitting: Specialist

## 2016-08-16 ENCOUNTER — Ambulatory Visit (INDEPENDENT_AMBULATORY_CARE_PROVIDER_SITE_OTHER): Payer: Medicaid Other | Admitting: Physician Assistant

## 2016-10-06 ENCOUNTER — Ambulatory Visit (INDEPENDENT_AMBULATORY_CARE_PROVIDER_SITE_OTHER): Payer: Medicaid Other | Admitting: Obstetrics

## 2016-10-06 ENCOUNTER — Other Ambulatory Visit (HOSPITAL_COMMUNITY)
Admission: RE | Admit: 2016-10-06 | Discharge: 2016-10-06 | Disposition: A | Discharge status: Home / Self Care | Payer: Medicaid Other | Source: Ambulatory Visit | Attending: Obstetrics | Admitting: Obstetrics

## 2016-10-06 ENCOUNTER — Encounter: Payer: Self-pay | Admitting: Obstetrics

## 2016-10-06 VITALS — BP 142/87 | HR 96 | Wt 182.5 lb

## 2016-10-06 DIAGNOSIS — N898 Other specified noninflammatory disorders of vagina: Secondary | ICD-10-CM | POA: Diagnosis present

## 2016-10-06 DIAGNOSIS — R6882 Decreased libido: Secondary | ICD-10-CM

## 2016-10-06 DIAGNOSIS — Z01419 Encounter for gynecological examination (general) (routine) without abnormal findings: Secondary | ICD-10-CM | POA: Diagnosis not present

## 2016-10-06 DIAGNOSIS — N951 Menopausal and female climacteric states: Secondary | ICD-10-CM

## 2016-10-06 DIAGNOSIS — F5101 Primary insomnia: Secondary | ICD-10-CM

## 2016-10-06 DIAGNOSIS — G8929 Other chronic pain: Secondary | ICD-10-CM

## 2016-10-06 DIAGNOSIS — E2839 Other primary ovarian failure: Secondary | ICD-10-CM

## 2016-10-06 DIAGNOSIS — Z0000 Encounter for general adult medical examination without abnormal findings: Secondary | ICD-10-CM

## 2016-10-06 DIAGNOSIS — M5442 Lumbago with sciatica, left side: Secondary | ICD-10-CM

## 2016-10-06 DIAGNOSIS — M47817 Spondylosis without myelopathy or radiculopathy, lumbosacral region: Secondary | ICD-10-CM

## 2016-10-06 DIAGNOSIS — F418 Other specified anxiety disorders: Secondary | ICD-10-CM

## 2016-10-06 DIAGNOSIS — N952 Postmenopausal atrophic vaginitis: Secondary | ICD-10-CM

## 2016-10-06 MED ORDER — OXYCODONE-ACETAMINOPHEN 10-325 MG PO TABS: 1.0000 | ORAL_TABLET | ORAL | 0 refills | Status: DC | PRN

## 2016-10-06 MED ORDER — CYCLOBENZAPRINE HCL 10 MG PO TABS: 10.0000 mg | ORAL_TABLET | Freq: Three times a day (TID) | ORAL | 1 refills | Status: DC | PRN

## 2016-10-06 MED ORDER — ZOLPIDEM TARTRATE 5 MG PO TABS: 5.0000 mg | ORAL_TABLET | Freq: Every evening | ORAL | 2 refills | Status: DC | PRN

## 2016-10-06 MED ORDER — VENLAFAXINE HCL ER 75 MG PO CP24: 75.0000 mg | ORAL_CAPSULE | Freq: Every day | ORAL | 11 refills | Status: DC

## 2016-10-06 MED ORDER — ESTRADIOL-LEVONORGESTREL 0.045-0.015 MG/DAY TD PTWK: 1.0000 | MEDICATED_PATCH | TRANSDERMAL | 12 refills | Status: DC

## 2016-10-06 NOTE — Patient Instructions (Addendum)
Degenerative Disk Disease Degenerative disk disease is a condition caused by the changes that occur in spinal disks as you grow older. Spinal disks are soft and compressible disks located between the bones of your spine (vertebrae). These disks act like shock absorbers. Degenerative disk disease can affect the whole spine. However, the neck and lower back are most commonly affected. Many changes can occur in the spinal disks with aging, such as:  The spinal disks may dry and shrink.  Small tears may occur in the tough, outer covering of the disk (annulus).  The disk space may become smaller due to loss of water.  Abnormal growths in the bone (spurs) may occur. This can put pressure on the nerve roots exiting the spinal canal, causing pain.  The spinal canal may become narrowed.  What increases the risk?  Being overweight.  Having a family history of degenerative disk disease.  Smoking.  There is increased risk if you are doing heavy lifting or have a sudden injury. What are the signs or symptoms? Symptoms vary from person to person and may include:  Pain that varies in intensity. Some people have no pain, while others have severe pain. The location of the pain depends on the part of your backbone that is affected. ? You will have neck or arm pain if a disk in the neck area is affected. ? You will have pain in your back, buttocks, or legs if a disk in the lower back is affected.  Pain that becomes worse while bending, reaching up, or with twisting movements.  Pain that may start gradually and then get worse as time passes. It may also start after a major or minor injury.  Numbness or tingling in the arms or legs.  How is this diagnosed? Your health care provider will ask you about your symptoms and about activities or habits that may cause the pain. He or she may also ask about any injuries, diseases, or treatments you have had. Your health care provider will examine you to check  for the range of movement that is possible in the affected area, to check for strength in your extremities, and to check for sensation in the areas of the arms and legs supplied by different nerve roots. You may also have:  An X-ray of the spine.  Other imaging tests, such as MRI.  How is this treated? Your health care provider will advise you on the best plan for treatment. Treatment may include:  Medicines.  Rehabilitation exercises.  Follow these instructions at home:  Follow proper lifting and walking techniques as advised by your health care provider.  Maintain good posture.  Exercise regularly as advised by your health care provider.  Perform relaxation exercises.  Change your sitting, standing, and sleeping habits as advised by your health care provider.  Change positions frequently.  Lose weight or maintain a healthy weight as advised by your health care provider.  Do not use any tobacco products, including cigarettes, chewing tobacco, or electronic cigarettes. If you need help quitting, ask your health care provider.  Wear supportive footwear.  Take medicines only as directed by your health care provider. Contact a health care provider if:  Your pain does not go away within 1-4 weeks.  You have significant appetite or weight loss. Get help right away if:  Your pain is severe.  You notice weakness in your arms, hands, or legs.  You begin to lose control of your bladder or bowel movements.  You have  fevers or night sweats. This information is not intended to replace advice given to you by your health care provider. Make sure you discuss any questions you have with your health care provider. Document Released: 02/07/2007 Document Revised: 09/18/2015 Document Reviewed: 08/14/2013 Elsevier Interactive Patient Education  2018 [employer] Maintenance, Female Adopting a healthy lifestyle and getting preventive care can go a long way to promote health and  wellness. Talk with your health care provider about what schedule of regular examinations is right for you. This is a good chance for you to check in with your provider about disease prevention and staying healthy. In between checkups, there are plenty of things you can do on your own. Experts have done a lot of research about which lifestyle changes and preventive measures are most likely to keep you healthy. Ask your health care provider for more information. Weight and diet Eat a healthy diet  Be sure to include plenty of vegetables, fruits, low-fat dairy products, and lean protein.  Do not eat a lot of foods high in solid fats, added sugars, or salt.  Get regular exercise. This is one of the most important things you can do for your health. ? Most adults should exercise for at least 150 minutes each week. The exercise should increase your heart rate and make you sweat (moderate-intensity exercise). ? Most adults should also do strengthening exercises at least twice a week. This is in addition to the moderate-intensity exercise.  Maintain a healthy weight  Body mass index (BMI) is a measurement that can be used to identify possible weight problems. It estimates body fat based on height and weight. Your health care provider can help determine your BMI and help you achieve or maintain a healthy weight.  For females 37 years of age and older: ? A BMI below 18.5 is considered underweight. ? A BMI of 18.5 to 24.9 is normal. ? A BMI of 25 to 29.9 is considered overweight. ? A BMI of 30 and above is considered obese.  Watch levels of cholesterol and blood lipids  You should start having your blood tested for lipids and cholesterol at 50 years of age, then have this test every 5 years.  You may need to have your cholesterol levels checked more often if: ? Your lipid or cholesterol levels are high. ? You are older than 50 years of age. ? You are at high risk for heart disease.  Cancer  screening Lung Cancer  Lung cancer screening is recommended for adults 64-44 years old who are at high risk for lung cancer because of a history of smoking.  A yearly low-dose CT scan of the lungs is recommended for people who: ? Currently smoke. ? Have quit within the past 15 years. ? Have at least a 30-pack-year history of smoking. A pack year is smoking an average of one pack of cigarettes a day for 1 year.  Yearly screening should continue until it has been 15 years since you quit.  Yearly screening should stop if you develop a health problem that would prevent you from having lung cancer treatment.  Breast Cancer  Practice breast self-awareness. This means understanding how your breasts normally appear and feel.  It also means doing regular breast self-exams. Let your health care provider know about any changes, no matter how small.  If you are in your 20s or 30s, you should have a clinical breast exam (CBE) by a health care provider every 1-3 years as part of  a regular health exam.  If you are 48 or older, have a CBE every year. Also consider having a breast X-ray (mammogram) every year.  If you have a family history of breast cancer, talk to your health care provider about genetic screening.  If you are at high risk for breast cancer, talk to your health care provider about having an MRI and a mammogram every year.  Breast cancer gene (BRCA) assessment is recommended for women who have family members with BRCA-related cancers. BRCA-related cancers include: ? Breast. ? Ovarian. ? Tubal. ? Peritoneal cancers.  Results of the assessment will determine the need for genetic counseling and BRCA1 and BRCA2 testing.  Cervical Cancer Your health care provider may recommend that you be screened regularly for cancer of the pelvic organs (ovaries, uterus, and vagina). This screening involves a pelvic examination, including checking for microscopic changes to the surface of your cervix  (Pap test). You may be encouraged to have this screening done every 3 years, beginning at age 21.  For women ages 40-65, health care providers may recommend pelvic exams and Pap testing every 3 years, or they may recommend the Pap and pelvic exam, combined with testing for human papilloma virus (HPV), every 5 years. Some types of HPV increase your risk of cervical cancer. Testing for HPV may also be done on women of any age with unclear Pap test results.  Other health care providers may not recommend any screening for nonpregnant women who are considered low risk for pelvic cancer and who do not have symptoms. Ask your health care provider if a screening pelvic exam is right for you.  If you have had past treatment for cervical cancer or a condition that could lead to cancer, you need Pap tests and screening for cancer for at least 20 years after your treatment. If Pap tests have been discontinued, your risk factors (such as having a new sexual partner) need to be reassessed to determine if screening should resume. Some women have medical problems that increase the chance of getting cervical cancer. In these cases, your health care provider may recommend more frequent screening and Pap tests.  Colorectal Cancer  This type of cancer can be detected and often prevented.  Routine colorectal cancer screening usually begins at 50 years of age and continues through 50 years of age.  Your health care provider may recommend screening at an earlier age if you have risk factors for colon cancer.  Your health care provider may also recommend using home test kits to check for hidden blood in the stool.  A small camera at the end of a tube can be used to examine your colon directly (sigmoidoscopy or colonoscopy). This is done to check for the earliest forms of colorectal cancer.  Routine screening usually begins at age 50.  Direct examination of the colon should be repeated every 5-10 years through 50 years  of age. However, you may need to be screened more often if early forms of precancerous polyps or small growths are found.  Skin Cancer  Check your skin from head to toe regularly.  Tell your health care provider about any new moles or changes in moles, especially if there is a change in a mole's shape or color.  Also tell your health care provider if you have a mole that is larger than the size of a pencil eraser.  Always use sunscreen. Apply sunscreen liberally and repeatedly throughout the day.  Protect yourself by wearing long sleeves,  pants, a wide-brimmed hat, and sunglasses whenever you are outside.  Heart disease, diabetes, and high blood pressure  High blood pressure causes heart disease and increases the risk of stroke. High blood pressure is more likely to develop in: ? People who have blood pressure in the high end of the normal range (130-139/85-89 mm Hg). ? People who are overweight or obese. ? People who are African American.  If you are 71-73 years of age, have your blood pressure checked every 3-5 years. If you are 12 years of age or older, have your blood pressure checked every year. You should have your blood pressure measured twice-once when you are at a hospital or clinic, and once when you are not at a hospital or clinic. Record the average of the two measurements. To check your blood pressure when you are not at a hospital or clinic, you can use: ? An automated blood pressure machine at a pharmacy. ? A home blood pressure monitor.  If you are between 57 years and 50 years old, ask your health care provider if you should take aspirin to prevent strokes.  Have regular diabetes screenings. This involves taking a blood sample to check your fasting blood sugar level. ? If you are at a normal weight and have a low risk for diabetes, have this test once every three years after 50 years of age. ? If you are overweight and have a high risk for diabetes, consider being tested  at a younger age or more often. Preventing infection Hepatitis B  If you have a higher risk for hepatitis B, you should be screened for this virus. You are considered at high risk for hepatitis B if: ? You were born in a country where hepatitis B is common. Ask your health care provider which countries are considered high risk. ? Your parents were born in a high-risk country, and you have not been immunized against hepatitis B (hepatitis B vaccine). ? You have HIV or AIDS. ? You use needles to inject street drugs. ? You live with someone who has hepatitis B. ? You have had sex with someone who has hepatitis B. ? You get hemodialysis treatment. ? You take certain medicines for conditions, including cancer, organ transplantation, and autoimmune conditions.  Hepatitis C  Blood testing is recommended for: ? Everyone born from [birth year] through 1964. ? Anyone with known risk factors for hepatitis C.  Sexually transmitted infections (STIs)  You should be screened for sexually transmitted infections (STIs) including gonorrhea and chlamydia if: ? You are sexually active and are younger than 50 years of age. ? You are older than 50 years of age and your health care provider tells you that you are at risk for this type of infection. ? Your sexual activity has changed since you were last screened and you are at an increased risk for chlamydia or gonorrhea. Ask your health care provider if you are at risk.  If you do not have HIV, but are at risk, it may be recommended that you take a prescription medicine daily to prevent HIV infection. This is called pre-exposure prophylaxis (PrEP). You are considered at risk if: ? You are sexually active and do not regularly use condoms or know the HIV status of your partner(s). ? You take drugs by injection. ? You are sexually active with a partner who has HIV.  Talk with your health care provider about whether you are at high risk of being infected with HIV. If  you  choose to begin PrEP, you should first be tested for HIV. You should then be tested every 3 months for as long as you are taking PrEP. Pregnancy  If you are premenopausal and you may become pregnant, ask your health care provider about preconception counseling.  If you may become pregnant, take 400 to 800 micrograms (mcg) of folic acid every day.  If you want to prevent pregnancy, talk to your health care provider about birth control (contraception). Osteoporosis and menopause  Osteoporosis is a disease in which the bones lose minerals and strength with aging. This can result in serious bone fractures. Your risk for osteoporosis can be identified using a bone density scan.  If you are 82 years of age or older, or if you are at risk for osteoporosis and fractures, ask your health care provider if you should be screened.  Ask your health care provider whether you should take a calcium or vitamin D supplement to lower your risk for osteoporosis.  Menopause may have certain physical symptoms and risks.  Hormone replacement therapy may reduce some of these symptoms and risks. Talk to your health care provider about whether hormone replacement therapy is right for you. Follow these instructions at home:  Schedule regular health, dental, and eye exams.  Stay current with your immunizations.  Do not use any tobacco products including cigarettes, chewing tobacco, or electronic cigarettes.  If you are pregnant, do not drink alcohol.  If you are breastfeeding, limit how much and how often you drink alcohol.  Limit alcohol intake to no more than 1 drink per day for nonpregnant women. One drink equals 12 ounces of beer, 5 ounces of wine, or 1 ounces of hard liquor.  Do not use street drugs.  Do not share needles.  Ask your health care provider for help if you need support or information about quitting drugs.  Tell your health care provider if you often feel depressed.  Tell your  health care provider if you have ever been abused or do not feel safe at home. This information is not intended to replace advice given to you by your health care provider. Make sure you discuss any questions you have with your health care provider. Document Released: 10/26/2010 Document Revised: 09/18/2015 Document Reviewed: 01/14/2015 Elsevier Interactive Patient Education  2018 [employer]. Insomnia Insomnia is a sleep disorder that makes it difficult to fall asleep or to stay asleep. Insomnia can cause tiredness (fatigue), low energy, difficulty concentrating, mood swings, and poor performance at work or school. There are three different ways to classify insomnia:  Difficulty falling asleep.  Difficulty staying asleep.  Waking up too early in the morning.  Any type of insomnia can be long-term (chronic) or short-term (acute). Both are common. Short-term insomnia usually lasts for three months or less. Chronic insomnia occurs at least three times a week for longer than three months. What are the causes? Insomnia may be caused by another condition, situation, or substance, such as:  Anxiety.  Certain medicines.  Gastroesophageal reflux disease (GERD) or other gastrointestinal conditions.  Asthma or other breathing conditions.  Restless legs syndrome, sleep apnea, or other sleep disorders.  Chronic pain.  Menopause. This may include hot flashes.  Stroke.  Abuse of alcohol, tobacco, or illegal drugs.  Depression.  Caffeine.  Neurological disorders, such as Alzheimer disease.  An overactive thyroid (hyperthyroidism).  The cause of insomnia may not be known. What increases the risk? Risk factors for insomnia include:  Gender. Women are more  commonly affected than men.  Age. Insomnia is more common as you get older.  Stress. This may involve your professional or personal life.  Income. Insomnia is more common in people with lower income.  Lack of  exercise.  Irregular work schedule or night shifts.  Traveling between different time zones.  What are the signs or symptoms? If you have insomnia, trouble falling asleep or trouble staying asleep is the main symptom. This may lead to other symptoms, such as:  Feeling fatigued.  Feeling nervous about going to sleep.  Not feeling rested in the morning.  Having trouble concentrating.  Feeling irritable, anxious, or depressed.  How is this treated? Treatment for insomnia depends on the cause. If your insomnia is caused by an underlying condition, treatment will focus on addressing the condition. Treatment may also include:  Medicines to help you sleep.  Counseling or therapy.  Lifestyle adjustments.  Follow these instructions at home:  Take medicines only as directed by your health care provider.  Keep regular sleeping and waking hours. Avoid naps.  Keep a sleep diary to help you and your health care provider figure out what could be causing your insomnia. Include: ? When you sleep. ? When you wake up during the night. ? How well you sleep. ? How rested you feel the next day. ? Any side effects of medicines you are taking. ? What you eat and drink.  Make your bedroom a comfortable place where it is easy to fall asleep: ? Put up shades or special blackout curtains to block light from outside. ? Use a white noise machine to block noise. ? Keep the temperature cool.  Exercise regularly as directed by your health care provider. Avoid exercising right before bedtime.  Use relaxation techniques to manage stress. Ask your health care provider to suggest some techniques that may work well for you. These may include: ? Breathing exercises. ? Routines to release muscle tension. ? Visualizing peaceful scenes.  Cut back on alcohol, caffeinated beverages, and cigarettes, especially close to bedtime. These can disrupt your sleep.  Do not overeat or eat spicy foods right before  bedtime. This can lead to digestive discomfort that can make it hard for you to sleep.  Limit screen use before bedtime. This includes: ? Watching TV. ? Using your smartphone, tablet, and computer.  Stick to a routine. This can help you fall asleep faster. Try to do a quiet activity, brush your teeth, and go to bed at the same time each night.  Get out of bed if you are still awake after 15 minutes of trying to sleep. Keep the lights down, but try reading or doing a quiet activity. When you feel sleepy, go back to bed.  Make sure that you drive carefully. Avoid driving if you feel very sleepy.  Keep all follow-up appointments as directed by your health care provider. This is important. Contact a health care provider if:  You are tired throughout the day or have trouble in your daily routine due to sleepiness.  You continue to have sleep problems or your sleep problems get worse. Get help right away if:  You have serious thoughts about hurting yourself or someone else. This information is not intended to replace advice given to you by your health care provider. Make sure you discuss any questions you have with your health care provider. Document Released: 04/09/2000 Document Revised: 09/12/2015 Document Reviewed: 01/11/2014 Elsevier Interactive Patient Education  2018 [employer]. Menopause Menopause is the normal  time of life when menstrual periods stop completely. Menopause is complete when you have missed 12 consecutive menstrual periods. It usually occurs between the ages of 46 years and 72 years. Very rarely does a woman develop menopause before the age of 13 years. At menopause, your ovaries stop producing the female hormones estrogen and progesterone. This can cause undesirable symptoms and also affect your health. Sometimes the symptoms may occur 4-5 years before the menopause begins. There is no relationship between menopause and:  Oral contraceptives.  Number of children you  had.  Race.  The age your menstrual periods started (menarche).  Heavy smokers and very thin women may develop menopause earlier in life. What are the causes?  The ovaries stop producing the female hormones estrogen and progesterone. Other causes include:  Surgery to remove both ovaries.  The ovaries stop functioning for no known reason.  Tumors of the pituitary gland in the brain.  Medical disease that affects the ovaries and hormone production.  Radiation treatment to the abdomen or pelvis.  Chemotherapy that affects the ovaries.  What are the signs or symptoms?  Hot flashes.  Night sweats.  Decrease in sex drive.  Vaginal dryness and thinning of the vagina causing painful intercourse.  Dryness of the skin and developing wrinkles.  Headaches.  Tiredness.  Irritability.  Memory problems.  Weight gain.  Bladder infections.  Hair growth of the face and chest.  Infertility. More serious symptoms include:  Loss of bone (osteoporosis) causing breaks (fractures).  Depression.  Hardening and narrowing of the arteries (atherosclerosis) causing heart attacks and strokes.  How is this diagnosed?  When the menstrual periods have stopped for 12 straight months.  Physical exam.  Hormone studies of the blood. How is this treated? There are many treatment choices and nearly as many questions about them. The decisions to treat or not to treat menopausal changes is an individual choice made with your health care provider. Your health care provider can discuss the treatments with you. Together, you can decide which treatment will work best for you. Your treatment choices may include:  Hormone therapy (estrogen and progesterone).  Non-hormonal medicines.  Treating the individual symptoms with medicine (for example antidepressants for depression).  Herbal medicines that may help specific symptoms.  Counseling by a psychiatrist or psychologist.  Group  therapy.  Lifestyle changes including: ? Eating healthy. ? Regular exercise. ? Limiting caffeine and alcohol. ? Stress management and meditation.  No treatment.  Follow these instructions at home:  Take the medicine your health care provider gives you as directed.  Get plenty of sleep and rest.  Exercise regularly.  Eat a diet that contains calcium (good for the bones) and soy products (acts like estrogen hormone).  Avoid alcoholic beverages.  Do not smoke.  If you have hot flashes, dress in layers.  Take supplements, calcium, and vitamin D to strengthen bones.  You can use over-the-counter lubricants or moisturizers for vaginal dryness.  Group therapy is sometimes very helpful.  Acupuncture may be helpful in some cases. Contact a health care provider if:  You are not sure you are in menopause.  You are having menopausal symptoms and need advice and treatment.  You are still having menstrual periods after age 67 years.  You have pain with intercourse.  Menopause is complete (no menstrual period for 12 months) and you develop vaginal bleeding.  You need a referral to a specialist (gynecologist, psychiatrist, or psychologist) for treatment. Get help right away if:  You  have severe depression.  You have excessive vaginal bleeding.  You fell and think you have a broken bone.  You have pain when you urinate.  You develop leg or chest pain.  You have a fast pounding heart beat (palpitations).  You have severe headaches.  You develop vision problems.  You feel a lump in your breast.  You have abdominal pain or severe indigestion. This information is not intended to replace advice given to you by your health care provider. Make sure you discuss any questions you have with your health care provider. Document Released: 07/03/2003 Document Revised: 09/18/2015 Document Reviewed: 11/09/2012 Elsevier Interactive Patient Education  2017 [place]. Menopause  and Herbal Products What is menopause? Menopause is the normal time of life when menstrual periods decrease in frequency and eventually stop completely. This process can take several years for some women. Menopause is complete when you have had an absence of menstruation for a full year since your last menstrual period. It usually occurs between the ages of 48 and 50. It is not common for menopause to begin before the age of 41. During menopause, your body stops producing the female hormones estrogen and progesterone. Common symptoms associated with this loss of hormones (vasomotor symptoms) are:  Hot flashes.  Hot flushes.  Night sweats.  Other common symptoms and complications of menopause include:  Decrease in sex drive.  Vaginal dryness and thinning of the walls of the vagina. This can make sex painful.  Dryness of the skin and development of wrinkles.  Headaches.  Tiredness.  Irritability.  Memory problems.  Weight gain.  Bladder infections.  Hair growth on the face and chest.  Inability to reproduce offspring (infertility).  Loss of density in the bones (osteoporosis) increasing your risk for breaks (fractures).  Depression.  Hardening and narrowing of the arteries (atherosclerosis). This increases your risk of heart attack and stroke.  What treatment options are available? There are many treatment choices for menopause symptoms. The most common treatment is hormone replacement therapy. Many alternative therapies for menopause are emerging, including the use of herbal products. These supplements can be found in the form of herbs, teas, oils, tinctures, and pills. Common herbal supplements for menopause are made from plants that contain phytoestrogens. Phytoestrogens are compounds that occur naturally in plants and plant products. They act like estrogen in the body. Foods and herbs that contain phytoestrogens include:  Soy.  Flax seeds.  Red  clover.  Ginseng.  What menopause symptoms may be helped if I use herbal products?  Vasomotor symptoms. These may be helped by: ? Soy. Some studies show that soy may have a moderate benefit for hot flashes. ? Black cohosh. There is limited evidence indicating this may be beneficial for hot flashes.  Symptoms that are related to heart and blood vessel disease. These may be helped by soy. Studies have shown that soy can help to lower cholesterol.  Depression. This may be helped by: ? St. John's wort. There is limited evidence that shows this may help mild to moderate depression. ? Black cohosh. There is evidence that this may help depression and mood swings.  Osteoporosis. Soy may help to decrease bone loss that is associated with menopause and may prevent osteoporosis. Limited evidence indicates that red clover may offer some bone loss protection as well. Other herbal products that are commonly used during menopause lack enough evidence to support their use as a replacement for conventional menopause therapies. These products include evening primrose, ginseng, and red  clover. What are the cases when herbal products should not be used during menopause? Do not use herbal products during menopause without your health care provider's approval if:  You are taking medicine.  You have a preexisting liver condition.  Are there any risks in my taking herbal products during menopause? If you choose to use herbal products to help with symptoms of menopause, keep in mind that:  Different supplements have different and unmeasured amounts of herbal ingredients.  Herbal products are not regulated the same way that medicines are.  Concentrations of herbs may vary depending on the way they are prepared. For example, the concentration may be different in a pill, tea, oil, and tincture.  Little is known about the risks of using herbal products, particularly the risks of long-term use.  Some herbal  supplements can be harmful when combined with certain medicines.  Most commonly reported side effects of herbal products are mild. However, if used improperly, many herbal supplements can cause serious problems. Talk to your health care provider before starting any herbal product. If problems develop, stop taking the supplement and let your health care provider know. This information is not intended to replace advice given to you by your health care provider. Make sure you discuss any questions you have with your health care provider. Document Released: 09/29/2007 Document Revised: 03/09/2016 Document Reviewed: 09/25/2013 Elsevier Interactive Patient Education  2017 [place].  Menopause and Hormone Replacement Therapy What is hormone replacement therapy? Hormone replacement therapy (HRT) is the use of artificial (synthetic) hormones to replace hormones that your body stops producing during menopause. Menopause is the normal time of life when menstrual periods stop completely and the ovaries stop producing the female hormones estrogen and progesterone. This lack of hormones can affect your health and cause undesirable symptoms. HRT can relieve some of those symptoms. What are my options for HRT? HRT may consist of the synthetic hormones estrogen and progestin, or it may consist of only estrogen (estrogen-only therapy). You and your health care provider will decide which form of HRT is best for you. If you choose to be on HRT and you have a uterus, estrogen and progestin are usually prescribed. Estrogen-only therapy is used for women who do not have a uterus. Possible options for taking HRT include:  Pills.  Patches.  Gels.  Sprays.  Vaginal cream.  Vaginal rings.  Vaginal inserts.  The amount of hormone(s) that you take and how long you take the hormone(s) varies depending on your individual health. It is important to:  Begin HRT with the lowest possible dosage.  Stop HRT as  soon as your health care provider tells you to stop.  Work with your health care provider so that you feel informed and comfortable with your decisions.  What are the benefits of HRT? HRT can reduce the frequency and severity of menopausal symptoms. Benefits of HRT vary depending on the menopausal symptoms that you have, the severity of your symptoms, and your overall health. HRT may help to improve the following menopausal symptoms:  Hot flashes and night sweats. These are sudden feelings of heat that spread over the face and body. The skin may turn red, like a blush. Night sweats are hot flashes that happen while you are sleeping or trying to sleep.  Bone loss (osteoporosis). The body loses calcium more quickly after menopause, causing the bones to become weaker. This can increase the risk for bone breaks (fractures).  Vaginal dryness. The lining of the vagina can  become thin and dry, which can cause pain during sexual intercourse or cause infection, burning, or itching.  Urinary tract infections.  Urinary incontinence. This is a decreased ability to control when you urinate.  Irritability.  Short-term memory problems.  What are the risks of HRT? Risks of HRT vary depending on your individual health and medical history. Risks of HRT also depend on whether you receive both estrogen and progestin or you receive estrogen only.HRT may increase the risk of:  Spotting. This is when a small amount of bloodleaks from the vagina unexpectedly.  Endometrial cancer. This cancer is in the lining of the uterus (endometrium).  Breast cancer.  Increased density of breast tissue. This can make it harder to find breast cancer on a breast X-ray (mammogram).  Stroke.  Heart attack.  Blood clots.  Gallbladder disease.  Risks of HRT can increase if you have any of the following conditions:  Endometrial cancer.  Liver disease.  Heart disease.  Breast cancer.  History of blood  clots.  History of stroke.  How should I care for myself while I am on HRT?  Take over-the-counter and prescription medicines only as told by your health care provider.  Get mammograms, pelvic exams, and medical checkups as often as told by your health care provider.  Have Pap tests done as often as told by your health care provider. A Pap test is sometimes called a Pap smear. It is a screening test that is used to check for signs of cancer of the cervix and vagina. A Pap test can also identify the presence of infection or precancerous changes. Pap tests may be done: ? Every 3 years, starting at age 33. ? Every 5 years, starting after age 14, in combination with testing for human papillomavirus (HPV). ? More often or less often depending on other medical conditions you have, your age, and other risk factors.  It is your responsibility to get your Pap test results. Ask your health care provider or the department performing the test when your results will be ready.  Keep all follow-up visits as told by your health care provider. This is important. When should I seek medical care? Talk with your health care provider if:  You have any of these: ? Pain or swelling in your legs. ? Shortness of breath. ? Chest pain. ? Lumps or changes in your breasts or armpits. ? Slurred speech. ? Pain, burning, or bleeding when you urine.  You develop any of these: ? Unusual vaginal bleeding. ? Dizziness or headaches. ? Weakness or numbness in any part of your arms or legs. ? Pain in your abdomen.  This information is not intended to replace advice given to you by your health care provider. Make sure you discuss any questions you have with your health care provider. Document Released: 01/09/2003 Document Revised: 03/09/2016 Document Reviewed: 10/14/2014 Elsevier Interactive Patient Education  2017 [place] is the time when your body begins to move into the menopause  (no menstrual period for 12 straight months). It is a natural process. Perimenopause can begin 2-8 years before the menopause and usually lasts for 1 year after the menopause. During this time, your ovaries may or may not produce an egg. The ovaries vary in their production of estrogen and progesterone hormones each month. This can cause irregular menstrual periods, difficulty getting pregnant, vaginal bleeding between periods, and uncomfortable symptoms. What are the causes?  Irregular production of the ovarian hormones, estrogen and progesterone, and not  ovulating every month. Other causes include:  Tumor of the pituitary gland in the brain.  Medical disease that affects the ovaries.  Radiation treatment.  Chemotherapy.  Unknown causes.  Heavy smoking and excessive alcohol intake can bring on perimenopause sooner.  What are the signs or symptoms?  Hot flashes.  Night sweats.  Irregular menstrual periods.  Decreased sex drive.  Vaginal dryness.  Headaches.  Mood swings.  Depression.  Memory problems.  Irritability.  Tiredness.  Weight gain.  Trouble getting pregnant.  The beginning of losing bone cells (osteoporosis).  The beginning of hardening of the arteries (atherosclerosis). How is this diagnosed? Your health care provider will make a diagnosis by analyzing your age, menstrual history, and symptoms. He or she will do a physical exam and note any changes in your body, especially your female organs. Female hormone tests may or may not be helpful depending on the amount of female hormones you produce and when you produce them. However, other hormone tests may be helpful to rule out other problems. How is this treated? In some cases, no treatment is needed. The decision on whether treatment is necessary during the perimenopause should be made by you and your health care provider based on how the symptoms are affecting you and your lifestyle. Various treatments  are available, such as:  Treating individual symptoms with a specific medicine for that symptom.  Herbal medicines that can help specific symptoms.  Counseling.  Group therapy.  Follow these instructions at home:  Keep track of your menstrual periods (when they occur, how heavy they are, how long between periods, and how long they last) as well as your symptoms and when they started.  Only take over-the-counter or prescription medicines as directed by your health care provider.  Sleep and rest.  Exercise.  Eat a diet that contains calcium (good for your bones) and soy (acts like the estrogen hormone).  Do not smoke.  Avoid alcoholic beverages.  Take vitamin supplements as recommended by your health care provider. Taking vitamin E may help in certain cases.  Take calcium and vitamin D supplements to help prevent bone loss.  Group therapy is sometimes helpful.  Acupuncture may help in some cases. Contact a health care provider if:  You have questions about any symptoms you are having.  You need a referral to a specialist (gynecologist, psychiatrist, or psychologist). Get help right away if:  You have vaginal bleeding.  Your period lasts longer than 8 days.  Your periods are recurring sooner than 21 days.  You have bleeding after intercourse.  You have severe depression.  You have pain when you urinate.  You have severe headaches.  You have vision problems. This information is not intended to replace advice given to you by your health care provider. Make sure you discuss any questions you have with your health care provider. Document Released: 05/20/2004 Document Revised: 09/18/2015 Document Reviewed: 11/09/2012 Elsevier Interactive Patient Education  2017 [employer]  Radicular Pain Radicular pain is a type of pain that spreads from your back or neck along a spinal nerve. Spinal nerves are nerves that leave the spinal cord and go to the muscles. Radicular  pain occurs when one of these nerves becomes irritated or squeezed (compressed). Radicular pain is sometimes called radiculopathy, radiculitis, or a pinched nerve. When you have this type of pain, you may also have weakness, numbness, or tingling in the area of your body that is supplied by the nerve. The pain may feel sharp  and burning. Spinal nerves leave the spinal cord through openings between the 24 bones (vertebrae) that make up the spine. Radicular pain is often caused by something pushing on a spinal nerve. This pushing may be done by a vertebra or by one of the round cushions between vertebrae (intervertebral disks). This can result from an injury, from wear and tear or aging of a disk, or from the growth of a bone spur that pushes on the nerve. Radicular pain can occur in various areas depending on which spinal nerve is affected:  Cervical radicular pain occurs in the neck. You may also feel pain, numbness, weakness, or tingling in the arms.  Thoracic radicular pain occurs in the mid-spine area. You would feel this pain in the back and chest. This type is rare.  Lumbar radicular pain occurs in the lower back area. You would feel this pain as low back pain. You may feel pain, numbness, weakness, or tingling in the buttocks or legs. Sciatica is a type of lumbar radicular pain that shoots down the back of the leg.  Radicular pain often goes away when you follow instructions from your health care provider for relieving pain at home. Follow these instructions at home: Managing pain  If directed, apply ice to the affected area: ? Put ice in a plastic bag. ? Place a towel between your skin and the bag. ? Leave the ice on for 20 minutes, 2-3 times a day.  If directed, apply heat to the affected area as often as told by your health care provider. Use the heat source that your health care provider recommends, such as a moist heat pack or a heating pad. ? Place a towel between your skin and the  heat source. ? Leave the heat on for 20-30 minutes. ? Remove the heat if your skin turns bright red. This is especially important if you are unable to feel pain, heat, or cold. You may have a greater risk of getting burned. Activity   Do not sit or rest in bed for long periods of time.  Try to stay as active as possible. Ask your health care provider what type of exercise or activity is best for you.  Avoid activities that make your pain worse, such as bending and lifting.  Do not lift anything that is heavier than 10 lb (4.5 kg). Practice using proper technique when lifting items. Proper lifting technique involves bending your knees and rising up.  Do strength and range-of-motion exercises only as told by your health care provider. General instructions  Take over-the-counter and prescription medicines only as told by your health care provider.  Pay attention to any changes in your symptoms.  Keep all follow-up visits as told by your health care provider. This is important. Contact a health care provider if:  Your pain and other symptoms get worse.  Your pain medicine is not helping.  Your pain has not improved after a few weeks of home care.  You have a fever. Get help right away if:  You have severe pain, weakness, or numbness.  You have difficulty with bladder or bowel control. This information is not intended to replace advice given to you by your health care provider. Make sure you discuss any questions you have with your health care provider. Document Released: 05/20/2004 Document Revised: 09/18/2015 Document Reviewed: 11/06/2014 Elsevier Interactive Patient Education  2018 [place].  Sciatica Sciatica is pain, numbness, weakness, or tingling along the path of the sciatic nerve. The sciatic  nerve starts in the lower back and runs down the back of each leg. The nerve controls the muscles in the lower leg and in the back of the knee. It also provides feeling  (sensation) to the back of the thigh, the lower leg, and the sole of the foot. Sciatica is a symptom of another medical condition that pinches or puts pressure on the sciatic nerve. Generally, sciatica only affects one side of the body. Sciatica usually goes away on its own or with treatment. In some cases, sciatica may keep coming back (recur). What are the causes? This condition is caused by pressure on the sciatic nerve, or pinching of the sciatic nerve. This may be the result of:  A disk in between the bones of the spine (vertebrae) bulging out too far (herniated disk).  Age-related changes in the spinal disks (degenerative disk disease).  A pain disorder that affects a muscle in the buttock (piriformis syndrome).  Extra bone growth (bone spur) near the sciatic nerve.  An injury or break (fracture) of the pelvis.  Pregnancy.  Tumor (rare).  What increases the risk? The following factors may make you more likely to develop this condition:  Playing sports that place pressure or stress on the spine, such as football or weight lifting.  Having poor strength and flexibility.  A history of back injury.  A history of back surgery.  Sitting for long periods of time.  Doing activities that involve repetitive bending or lifting.  Obesity.  What are the signs or symptoms? Symptoms can vary from mild to very severe, and they may include:  Any of these problems in the lower back, leg, hip, or buttock: ? Mild tingling or dull aches. ? Burning sensations. ? Sharp pains.  Numbness in the back of the calf or the sole of the foot.  Leg weakness.  Severe back pain that makes movement difficult.  These symptoms may get worse when you cough, sneeze, or laugh, or when you sit or stand for long periods of time. Being overweight may also make symptoms worse. In some cases, symptoms may recur over time. How is this diagnosed? This condition may be diagnosed based on:  Your  symptoms.  A physical exam. Your health care provider may ask you to do certain movements to check whether those movements trigger your symptoms.  You may have tests, including: ? Blood tests. ? X-rays. ? MRI. ? CT scan.  How is this treated? In many cases, this condition improves on its own, without any treatment. However, treatment may include:  Reducing or modifying physical activity during periods of pain.  Exercising and stretching to strengthen your abdomen and improve the flexibility of your spine.  Icing and applying heat to the affected area.  Medicines that help: ? To relieve pain and swelling. ? To relax your muscles.  Injections of medicines that help to relieve pain, irritation, and inflammation around the sciatic nerve (steroids).  Surgery.  Follow these instructions at home: Medicines  Take over-the-counter and prescription medicines only as told by your health care provider.  Do not drive or operate heavy machinery while taking prescription pain medicine. Managing pain  If directed, apply ice to the affected area. ? Put ice in a plastic bag. ? Place a towel between your skin and the bag. ? Leave the ice on for 20 minutes, 2-3 times a day.  After icing, apply heat to the affected area before you exercise or as often as told by your health  care provider. Use the heat source that your health care provider recommends, such as a moist heat pack or a heating pad. ? Place a towel between your skin and the heat source. ? Leave the heat on for 20-30 minutes. ? Remove the heat if your skin turns bright red. This is especially important if you are unable to feel pain, heat, or cold. You may have a greater risk of getting burned. Activity  Return to your normal activities as told by your health care provider. Ask your health care provider what activities are safe for you. ? Avoid activities that make your symptoms worse.  Take brief periods of rest throughout the  day. Resting in a lying or standing position is usually better than sitting to rest. ? When you rest for longer periods, mix in some mild activity or stretching between periods of rest. This will help to prevent stiffness and pain. ? Avoid sitting for long periods of time without moving. Get up and move around at least one time each hour.  Exercise and stretch regularly, as told by your health care provider.  Do not lift anything that is heavier than 10 lb (4.5 kg) while you have symptoms of sciatica. When you do not have symptoms, you should still avoid heavy lifting, especially repetitive heavy lifting.  When you lift objects, always use proper lifting technique, which includes: ? Bending your knees. ? Keeping the load close to your body. ? Avoiding twisting. General instructions  Use good posture. ? Avoid leaning forward while sitting. ? Avoid hunching over while standing.  Maintain a healthy weight. Excess weight puts extra stress on your back and makes it difficult to maintain good posture.  Wear supportive, comfortable shoes. Avoid wearing high heels.  Avoid sleeping on a mattress that is too soft or too hard. A mattress that is firm enough to support your back when you sleep may help to reduce your pain.  Keep all follow-up visits as told by your health care provider. This is important. Contact a health care provider if:  You have pain that wakes you up when you are sleeping.  You have pain that gets worse when you lie down.  Your pain is worse than you have experienced in the past.  Your pain lasts longer than 4 weeks.  You experience unexplained weight loss. Get help right away if:  You lose control of your bowel or bladder (incontinence).  You have: ? Weakness in your lower back, pelvis, buttocks, or legs that gets worse. ? Redness or swelling of your back. ? A burning sensation when you urinate. This information is not intended to replace advice given to you by  your health care provider. Make sure you discuss any questions you have with your health care provider. Document Released: 04/06/2001 Document Revised: 09/16/2015 Document Reviewed: 12/20/2014 Elsevier Interactive Patient Education  2017 [place].  Sciatica Rehab Ask your health care provider which exercises are safe for you. Do exercises exactly as told by your health care provider and adjust them as directed. It is normal to feel mild stretching, pulling, tightness, or discomfort as you do these exercises, but you should stop right away if you feel sudden pain or your pain gets worse.Do not begin these exercises until told by your health care provider. Stretching and range of motion exercises These exercises warm up your muscles and joints and improve the movement and flexibility of your hips and your back. These exercises also help to relieve pain, numbness,  and tingling. Exercise A: Sciatic nerve glide 1. Sit in a chair with your head facing down toward your chest. Place your hands behind your back. Let your shoulders slump forward. 2. Slowly straighten one of your knees while you tilt your head back as if you are looking toward the ceiling. Only straighten your leg as far as you can without making your symptoms worse. 3. Hold for __________ seconds. 4. Slowly return to the starting position. 5. Repeat with your other leg. Repeat __________ times. Complete this exercise __________ times a day. Exercise B: Knee to chest with hip adduction and internal rotation  1. Lie on your back on a firm surface with both legs straight. 2. Bend one of your knees and move it up toward your chest until you feel a gentle stretch in your lower back and buttock. Then, move your knee toward the shoulder that is on the opposite side from your leg. ? Hold your leg in this position by holding onto the front of your knee. 3. Hold for __________ seconds. 4. Slowly return to the starting position. 5. Repeat  with your other leg. Repeat __________ times. Complete this exercise __________ times a day. Exercise C: Prone extension on elbows  1. Lie on your abdomen on a firm surface. A bed may be too soft for this exercise. 2. Prop yourself up on your elbows. 3. Use your arms to help lift your chest up until you feel a gentle stretch in your abdomen and your lower back. ? This will place some of your body weight on your elbows. If this is uncomfortable, try stacking pillows under your chest. ? Your hips should stay down, against the surface that you are lying on. Keep your hip and back muscles relaxed. 4. Hold for __________ seconds. 5. Slowly relax your upper body and return to the starting position. Repeat __________ times. Complete this exercise __________ times a day. Strengthening exercises These exercises build strength and endurance in your back. Endurance is the ability to use your muscles for a long time, even after they get tired. Exercise D: Pelvic tilt 1. Lie on your back on a firm surface. Bend your knees and keep your feet flat. 2. Tense your abdominal muscles. Tip your pelvis up toward the ceiling and flatten your lower back into the floor. ? To help with this exercise, you may place a small towel under your lower back and try to push your back into the towel. 3. Hold for __________ seconds. 4. Let your muscles relax completely before you repeat this exercise. Repeat __________ times. Complete this exercise __________ times a day. Exercise E: Alternating arm and leg raises  1. Get on your hands and knees on a firm surface. If you are on a hard floor, you may want to use padding to cushion your knees, such as an exercise mat. 2. Line up your arms and legs. Your hands should be below your shoulders, and your knees should be below your hips. 3. Lift your left leg behind you. At the same time, raise your right arm and straighten it in front of you. ? Do not lift your leg higher than your  hip. ? Do not lift your arm higher than your shoulder. ? Keep your abdominal and back muscles tight. ? Keep your hips facing the ground. ? Do not arch your back. ? Keep your balance carefully, and do not hold your breath. 4. Hold for __________ seconds. 5. Slowly return to the starting position and  repeat with your right leg and your left arm. Repeat __________ times. Complete this exercise __________ times a day. Posture and body mechanics  Body mechanics refers to the movements and positions of your body while you do your daily activities. Posture is part of body mechanics. Good posture and healthy body mechanics can help to relieve stress in your body's tissues and joints. Good posture means that your spine is in its natural S-curve position (your spine is neutral), your shoulders are pulled back slightly, and your head is not tipped forward. The following are general guidelines for applying improved posture and body mechanics to your everyday activities. Standing   When standing, keep your spine neutral and your feet about hip-width apart. Keep a slight bend in your knees. Your ears, shoulders, and hips should line up.  When you do a task in which you stand in one place for a long time, place one foot up on a stable object that is 2-4 inches (5-10 cm) high, such as a footstool. This helps keep your spine neutral. Sitting   When sitting, keep your spine neutral and keep your feet flat on the floor. Use a footrest, if necessary, and keep your thighs parallel to the floor. Avoid rounding your shoulders, and avoid tilting your head forward.  When working at a desk or a computer, keep your desk at a height where your hands are slightly lower than your elbows. Slide your chair under your desk so you are close enough to maintain good posture.  When working at a computer, place your monitor at a height where you are looking straight ahead and you do not have to tilt your head forward or downward  to look at the screen. Resting   When lying down and resting, avoid positions that are most painful for you.  If you have pain with activities such as sitting, bending, stooping, or squatting (flexion-based activities), lie in a position in which your body does not bend very much. For example, avoid curling up on your side with your arms and knees near your chest (fetal position).  If you have pain with activities such as standing for a long time or reaching with your arms (extension-based activities), lie with your spine in a neutral position and bend your knees slightly. Try the following positions: ? Lying on your side with a pillow between your knees. ? Lying on your back with a pillow under your knees. Lifting   When lifting objects, keep your feet at least shoulder-width apart and tighten your abdominal muscles.  Bend your knees and hips and keep your spine neutral. It is important to lift using the strength of your legs, not your back. Do not lock your knees straight out.  Always ask for help to lift heavy or awkward objects. This information is not intended to replace advice given to you by your health care provider. Make sure you discuss any questions you have with your health care provider. Document Released: 04/12/2005 Document Revised: 12/18/2015 Document Reviewed: 12/27/2014 Elsevier Interactive Patient Education  2018 [place]. Atrophic Vaginitis Atrophic vaginitis is a condition in which the tissues that line the vagina become dry and thin. This condition is most common in women who have stopped having regular menstrual periods (menopause). This usually starts when a woman is 104-71 years old. Estrogen helps to keep the vagina moist. It stimulates the vagina to produce a clear fluid that lubricates the vagina for sexual intercourse. This fluid also protects the vagina from  infection. Lack of estrogen can cause the lining of the vagina to get thinner and dryer. The vagina  may also shrink in size. It may become less elastic. Atrophic vaginitis tends to get worse over time as a woman's estrogen level drops. What are the causes? This condition is caused by the normal drop in estrogen that happens around the time of menopause. What increases the risk? Certain conditions or situations may lower a woman's estrogen level, which increases her risk of atrophic vaginitis. These include:  Taking medicine that blocks estrogen.  Having ovaries removed surgically.  Being treated for cancer with X-ray treatment (radiation) or medicines (chemotherapy).  Exercising very hard and often.  Having an eating disorder (anorexia).  Giving birth or breastfeeding.  Being over the age of 67.  Smoking.  What are the signs or symptoms? Symptoms of this condition include:  Pain, soreness, or bleeding during sexual intercourse (dyspareunia).  Vaginal burning, irritation, or itching.  Pain or bleeding during a vaginal examination using a speculum (pelvic exam).  Loss of interest in sexual activity.  Having burning pain when passing urine.  Vaginal discharge that is brown or yellow.  In some cases, there are no symptoms. How is this diagnosed? This condition is diagnosed with a medical history and physical exam. This will include a pelvic exam that checks whether the inside of your vagina appears pale, thin, or dry. Rarely, you may also have other tests, including:  A urine test.  A test that checks the acid balance in your vaginal fluid (acid balance test).  How is this treated? Treatment for this condition may depend on the severity of your symptoms. Treatment may include:  Using an over-the-counter vaginal lubricant before you have sexual intercourse.  Using a long-acting vaginal moisturizer.  Using low-dose vaginal estrogen for moderate to severe symptoms that do not respond to other treatments. Options include creams, tablets, and inserts (vaginal rings).  Before using vaginal estrogen, tell your health care provider if you have a history of: ? Breast cancer. ? Endometrial cancer. ? Blood clots.  Taking medicines. You may be able to take a daily pill for dyspareunia. Discuss all of the risks of this medicine with your health care provider. It is usually not recommended for women who have a family history or personal history of breast cancer.  If your symptoms are very mild and you are not sexually active, you may not need treatment. Follow these instructions at home:  Take medicines only as directed by your health care provider. Do not use herbal or alternative medicines unless your health care provider says that you can.  Use over-the-counter creams, lubricants, or moisturizers for dryness only as directed by your health care provider.  If your atrophic vaginitis is caused by menopause, discuss all of your menopausal symptoms and treatment options with your health care provider.  Do not douche.  Do not use products that can make your vagina dry. These include: ? Scented feminine sprays. ? Scented tampons. ? Scented soaps.  If it hurts to have sex, talk with your sexual partner. Contact a health care provider if:  Your discharge looks different than normal.  Your vagina has an unusual smell.  You have new symptoms.  Your symptoms do not improve with treatment.  Your symptoms get worse. This information is not intended to replace advice given to you by your health care provider. Make sure you discuss any questions you have with your health care provider. Document Released: 08/27/2014 Document Revised:  09/18/2015 Document Reviewed: 04/03/2014 Elsevier Interactive Patient Education  2018 [employer].  Major Depressive Disorder, Adult Major depressive disorder (MDD) is a mental health condition. It may also be called clinical depression or unipolar depression. MDD usually causes feelings of sadness, hopelessness, or helplessness.  MDD can also cause physical symptoms. It can interfere with work, school, relationships, and other everyday activities. MDD may be mild, moderate, or severe. It may occur once (single episode major depressive disorder) or it may occur multiple times (recurrent major depressive disorder). What are the causes? The exact cause of this condition is not known. MDD is most likely caused by a combination of things, which may include:  Genetic factors. These are traits that are passed along from parent to child.  Individual factors. Your personality, your behavior, and the way you handle your thoughts and feelings may contribute to MDD. This includes personality traits and behaviors learned from others.  Physical factors, such as: ? Differences in the part of your brain that controls emotion. This part of your brain may be different than it is in people who do not have MDD. ? Long-term (chronic) medical or psychiatric illnesses.  Social factors. Traumatic experiences or major life changes may play a role in the development of MDD.  What increases the risk? This condition is more likely to develop in women. The following factors may also make you more likely to develop MDD:  A family history of depression.  Troubled family relationships.  Abnormally low levels of certain brain chemicals.  Traumatic events in childhood, especially abuse or the loss of a parent.  Being under a lot of stress, or long-term stress, especially from upsetting life experiences or losses.  A history of: ? Chronic physical illness. ? Other mental health disorders. ? Substance abuse.  Poor living conditions.  Experiencing social exclusion or discrimination on a regular basis.  What are the signs or symptoms? The main symptoms of MDD typically include:  Constant depressed or irritable mood.  Loss of interest in things and activities.  MDD symptoms may also include:  Sleeping or eating too much or too  little.  Unexplained weight change.  Fatigue or low energy.  Feelings of worthlessness or guilt.  Difficulty thinking clearly or making decisions.  Thoughts of suicide or of harming others.  Physical agitation or weakness.  Isolation.  Severe cases of MDD may also occur with other symptoms, such as:  Delusions or hallucinations, in which you imagine things that are not real (psychotic depression).  Low-level depression that lasts at least a year (chronic depression or persistent depressive disorder).  Extreme sadness and hopelessness (melancholic depression).  Trouble speaking and moving (catatonic depression).  How is this diagnosed? This condition may be diagnosed based on:  Your symptoms.  Your medical history, including your mental health history. This may involve tests to evaluate your mental health. You may be asked questions about your lifestyle, including any drug and alcohol use, and how long you have had symptoms of MDD.  A physical exam.  Blood tests to rule out other conditions.  You must have a depressed mood and at least four other MDD symptoms most of the day, nearly every day in the same 2-week timeframe before your health care provider can confirm a diagnosis of MDD. How is this treated? This condition is usually treated by mental health professionals, such as psychologists, psychiatrists, and clinical social workers. You may need more than one type of treatment. Treatment may include:  Psychotherapy. This  is also called talk therapy or counseling. Types of psychotherapy include: ? Cognitive behavioral therapy (CBT). This type of therapy teaches you to recognize unhealthy feelings, thoughts, and behaviors, and replace them with positive thoughts and actions. ? Interpersonal therapy (IPT). This helps you to improve the way you relate to and communicate with others. ? Family therapy. This treatment includes members of your family.  Medicine to treat  anxiety and depression, or to help you control certain emotions and behaviors.  Lifestyle changes, such as: ? Limiting alcohol and drug use. ? Exercising regularly. ? Getting plenty of sleep. ? Making healthy eating choices. ? Spending more time outdoors.  Treatments involving stimulation of the brain can be used in situations with extremely severe symptoms, or when medicine or other therapies do not work over time. These treatments include electroconvulsive therapy, transcranial magnetic stimulation, and vagal nerve stimulation. Follow these instructions at home: Activity  Return to your normal activities as told by your health care provider.  Exercise regularly and spend time outdoors as told by your health care provider. General instructions  Take over-the-counter and prescription medicines only as told by your health care provider.  Do not drink alcohol. If you drink alcohol, limit your alcohol intake to no more than 1 drink a day for nonpregnant women and 2 drinks a day for men. One drink equals 12 oz of beer, 5 oz of wine, or 1 oz of hard liquor. Alcohol can affect any antidepressant medicines you are taking. Talk to your health care provider about your alcohol use.  Eat a healthy diet and get plenty of sleep.  Find activities that you enjoy doing, and make time to do them.  Consider joining a support group. Your health care provider may be able to recommend a support group.  Keep all follow-up visits as told by your health care provider. This is important. Where to find more information: [employer] on Mental Illness  www.nami.org  [place] National Institute of Mental Health  https://carter.com/  National Suicide Prevention Lifeline  1-800-273-TALK 4427361101). This is free, 24-hour help.  Contact a health care provider if:  Your symptoms get worse.  You develop new symptoms. Get help right away if:  You self-harm.  You have serious thoughts about hurting  yourself or others.  You see, hear, taste, smell, or feel things that are not present (hallucinate). This information is not intended to replace advice given to you by your health care provider. Make sure you discuss any questions you have with your health care provider. Document Released: 08/07/2012 Document Revised: 12/18/2015 Document Reviewed: 10/22/2015 Elsevier Interactive Patient Education  2017 [employer].

## 2016-10-06 NOTE — Progress Notes (Signed)
Subjective:        [name] is a 51 y.o. female here for a routine exam.  Current complaints: Vaginal pain and dryness.  Decreased libido.  Hot flashes and insomnia.  Crying all the time.  Severe low backache that radiates down left leg.  Was placed on HRT a few months ago but stopped because of vaginal bleeding.  LMP prior to vaginal bleeding with HRT was 7 months.   Personal health questionnaire:  Is patient Ashkenazi Jewish, have a family history of breast and/or ovarian cancer: no Is there a family history of uterine cancer diagnosed at age < 54, gastrointestinal cancer, urinary tract cancer, family member who is a Personnel officer syndrome-associated carrier: no Is the patient overweight and hypertensive, family history of diabetes, personal history of gestational diabetes, preeclampsia or PCOS: no Is patient over 21, have PCOS,  family history of premature CHD under age 70, diabetes, smoke, have hypertension or peripheral artery disease:  no At any time, has a partner hit, kicked or otherwise hurt or frightened you?: no Over the past 2 weeks, have you felt down, depressed or hopeless?: no Over the past 2 weeks, have you felt little interest or pleasure in doing things?:no   Gynecologic History Patient's last menstrual period was 03/18/2016 (approximate). Contraception: post menopausal status Last Pap: 2017. Results were: normal Last mammogram: 2017. Results were: normal  Obstetric History OB History  Gravida Para Term Preterm AB Living  5       2 3   SAB TAB Ectopic Multiple Live Births  1   1   3     # Outcome Date GA Lbr Len/2nd Weight Sex Delivery Anes PTL Lv  5 Gravida           4 Gravida           3 Gravida           2 Ectopic           1 SAB               Past Medical History:  Diagnosis Date  . Carpal tunnel syndrome   . Hypercholesteremia   . Hypertension     Past Surgical History:  Procedure Laterality Date  . CARPAL TUNNEL RELEASE    . CESAREAN SECTION     . CHOLECYSTECTOMY       Current Outpatient Prescriptions:  .  cyclobenzaprine (FLEXERIL) 10 MG tablet, Take 1 tablet (10 mg total) by mouth every 8 (eight) hours as needed for muscle spasms., Disp: 30 tablet, Rfl: 1 .  estradiol (VIVELLE-DOT) 0.025 MG/24HR, Place 1 patch onto the skin 2 (two) times a week., Disp: 8 patch, Rfl: 11 .  estradiol-levonorgestrel (CLIMARA PRO) 0.045-0.015 MG/DAY, Place 1 patch onto the skin once a week., Disp: 4 patch, Rfl: 12 .  hydrochlorothiazide (HYDRODIURIL) 25 MG tablet, Take 1 tablet (25 mg total) by mouth daily., Disp: 90 tablet, Rfl: 1 .  oxyCODONE-acetaminophen (PERCOCET) 10-325 MG tablet, Take 1 tablet by mouth every 4 (four) hours as needed for pain., Disp: 30 tablet, Rfl: 0 .  progesterone (PROMETRIUM) 200 MG capsule, Take 1 capsule (200 mg total) by mouth daily., Disp: 12 capsule, Rfl: 11 .  venlafaxine XR (EFFEXOR-XR) 75 MG 24 hr capsule, Take 1 capsule (75 mg total) by mouth daily., Disp: 30 capsule, Rfl: 11 .  zolpidem (AMBIEN) 5 MG tablet, Take 1 tablet (5 mg total) by mouth at bedtime as needed for sleep., Disp: 30 tablet, Rfl: 2  Allergies  Allergen Reactions  . Ibuprofen Other (See Comments)    Blood in stool  . Benadryl [Diphenhydramine] Other (See Comments)    Unknown  . Sulfa Antibiotics Itching    Social History  Substance Use Topics  . Smoking status: Current Every Day Smoker    Packs/day: 0.30    Types: Cigarettes  . Smokeless tobacco: Never Used  . Alcohol use No    Family History  Problem Relation Age of Onset  . Hypertension Mother   . Hypertension Father   . Cancer Maternal Grandfather       Review of Systems  Constitutional: negative for fatigue and weight loss Respiratory: negative for cough and wheezing Cardiovascular: negative for chest pain, fatigue and palpitations Gastrointestinal: negative for abdominal pain and change in bowel habits Musculoskeletal:negative for myalgias Neurological: negative for gait  problems and tremors Behavioral/Psych: negative for abusive relationship, depression Endocrine: negative for temperature intolerance    Genitourinary:negative for abnormal menstrual periods, genital lesions, hot flashes, sexual problems and vaginal discharge Integument/breast: negative for breast lump, breast tenderness, nipple discharge and skin lesion(s)    Objective:       BP (!) 142/87   Pulse 96   Wt 182 lb 8 oz (82.8 kg)   LMP 03/18/2016 (Approximate)   BMI 31.82 kg/m  General:   alert  Skin:   no rash or abnormalities  Lungs:   clear to auscultation bilaterally  Heart:   regular rate and rhythm, S1, S2 normal, no murmur, click, rub or gallop  Breasts:   normal without suspicious masses, skin or nipple changes or axillary nodes  Abdomen:  normal findings: no organomegaly, soft, non-tender and no hernia  Pelvis:  External genitalia: normal general appearance Urinary system: urethral meatus normal and bladder without fullness, nontender Vaginal: normal without tenderness, induration or masses Cervix: normal appearance Adnexa: normal bimanual exam Uterus: anteverted and non-tender, normal size   Lab Review Urine pregnancy test Labs reviewed yes Radiologic studies reviewed yes  50% of 20 min visit spent on counseling and coordination of care.    Assessment:   1. Encounter for routine gynecological examination with Papanicolaou smear of cervix Rx: - Cytology - PAP - oxyCODONE-acetaminophen (PERCOCET) 10-325 MG tablet; Take 1 tablet by mouth every 4 (four) hours as needed for pain.  Dispense: 30 tablet; Refill: 0  2. Perimenopause Rx: - estradiol-levonorgestrel (CLIMARA PRO) 0.045-0.015 MG/DAY; Place 1 patch onto the skin once a week.  Dispense: 4 patch; Refill: 12  3. Perimenopausal atrophic vaginitis Rx: - estradiol-levonorgestrel (CLIMARA PRO) 0.045-0.015 MG/DAY; Place 1 patch onto the skin once a week.  Dispense: 4 patch; Refill: 12  4. Estrogen  deficiency Rx: - estradiol-levonorgestrel (CLIMARA PRO) 0.045-0.015 MG/DAY; Place 1 patch onto the skin once a week.  Dispense: 4 patch; Refill: 12  5. DJD (degenerative joint disease), lumbosacral Rx: - Ambulatory referral to Orthopedics  6. Primary insomnia Rx: - zolpidem (AMBIEN) 5 MG tablet; Take 1 tablet (5 mg total) by mouth at bedtime as needed for sleep.  Dispense: 30 tablet; Refill: 2  7. Chronic midline low back pain with left-sided sciatica Rx: - cyclobenzaprine (FLEXERIL) 10 MG tablet; Take 1 tablet (10 mg total) by mouth every 8 (eight) hours as needed for muscle spasms.  Dispense: 30 tablet; Refill: 1  8. Depression with anxiety Rx: - venlafaxine XR (EFFEXOR-XR) 75 MG 24 hr capsule; Take 1 capsule (75 mg total) by mouth daily.  Dispense: 30 capsule; Refill: 11  9. Hot flushes, perimenopausal Rx: -  estradiol-levonorgestrel (CLIMARA PRO) 0.045-0.015 MG/DAY; Place 1 patch onto the skin once a week.  Dispense: 4 patch; Refill: 12 - venlafaxine XR (EFFEXOR-XR) 75 MG 24 hr capsule; Take 1 capsule (75 mg total) by mouth daily.  Dispense: 30 capsule; Refill: 11  10. Vaginal discharge Rx: - Cervicovaginal ancillary only  11. Decreased libido Rx: - Effexor XR - Climara Pro     Plan:    Education reviewed: calcium supplements, depression evaluation, low fat, low cholesterol diet, self breast exams and weight bearing exercise. Hormone replacement therapy: hormone replacement therapy: Climara Pro and prescription for 12 months. Follow up in: 3 months.   Meds ordered this encounter  Medications  . oxyCODONE-acetaminophen (PERCOCET) 10-325 MG tablet    Sig: Take 1 tablet by mouth every 4 (four) hours as needed for pain.    Dispense:  30 tablet    Refill:  0  . estradiol-levonorgestrel (CLIMARA PRO) 0.045-0.015 MG/DAY    Sig: Place 1 patch onto the skin once a week.    Dispense:  4 patch    Refill:  12  . venlafaxine XR (EFFEXOR-XR) 75 MG 24 hr capsule    Sig: Take 1  capsule (75 mg total) by mouth daily.    Dispense:  30 capsule    Refill:  11  . zolpidem (AMBIEN) 5 MG tablet    Sig: Take 1 tablet (5 mg total) by mouth at bedtime as needed for sleep.    Dispense:  30 tablet    Refill:  2  . cyclobenzaprine (FLEXERIL) 10 MG tablet    Sig: Take 1 tablet (10 mg total) by mouth every 8 (eight) hours as needed for muscle spasms.    Dispense:  30 tablet    Refill:  1   Orders Placed This Encounter  Procedures  . Ambulatory referral to Orthopedics    Referral Priority:   Routine    Referral Type:   Consultation    Number of Visits Requested:   1      Patient ID: [name], female   DOB: 1966/04/26, 51 y.o.   MRN: [id number]

## 2016-10-06 NOTE — Progress Notes (Signed)
Patient is in the office for annual exam. Patient reports she is having pain in her vagina and pain Left buttock traveling down the back of her leg. She has irritability, trouble sleeping, no cycle in 7 months, hot flashes, decreased sex drive. Patient was given some HRT- but stopped it due to bleeding.

## 2016-10-07 LAB — CERVICOVAGINAL ANCILLARY ONLY
BACTERIAL VAGINITIS: NEGATIVE
CANDIDA VAGINITIS: NEGATIVE
CHLAMYDIA, DNA PROBE: NEGATIVE
NEISSERIA GONORRHEA: NEGATIVE
Trichomonas: NEGATIVE

## 2016-10-08 LAB — CYTOLOGY - PAP
Diagnosis: NEGATIVE
HPV: NOT DETECTED

## 2016-10-21 ENCOUNTER — Telehealth: Payer: Self-pay | Admitting: *Deleted

## 2016-10-21 NOTE — Telephone Encounter (Signed)
Fax from pharmacy requesting refill on Percocet 10-325 Rx. Called to pharmacy, they state pt filled Rx on 10-06-16 and is now requesting additional refill. Rx was given at time of visit on 10/06/16.  Do you wish to refill at this time?  Plan for pain management?  Please advise.

## 2016-10-22 NOTE — Telephone Encounter (Signed)
No refill

## 2016-10-29 ENCOUNTER — Telehealth: Payer: Self-pay | Admitting: *Deleted

## 2016-10-29 NOTE — Telephone Encounter (Signed)
Patient is calling to report she thinks she has a UTI. She is also calling to report she has not been contacted about the referral to ortho for her back. She is having a real hard time getting the patches to stick to her skin because she sweats so much.She is also requesting a refill of the pain medication Dr Clearance CootsHarper gave her. Told patient we could treat the UTI and address the hormone problem- but Dr Clearance CootsHarper would not be able to refill her narcotic. She asked if there is something else he could give her.  I told her I would send our referral coordinator a message to follow up with the referral to see if she could see what is taking so long.

## 2016-11-01 ENCOUNTER — Other Ambulatory Visit: Payer: Self-pay | Admitting: Obstetrics

## 2016-11-01 DIAGNOSIS — R399 Unspecified symptoms and signs involving the genitourinary system: Secondary | ICD-10-CM

## 2016-11-01 MED ORDER — NITROFURANTOIN MONOHYD MACRO 100 MG PO CAPS: 100.0000 mg | ORAL_CAPSULE | Freq: Two times a day (BID) | ORAL | 0 refills | Status: DC

## 2016-11-02 ENCOUNTER — Other Ambulatory Visit: Payer: Self-pay | Admitting: Obstetrics

## 2016-11-02 ENCOUNTER — Other Ambulatory Visit: Payer: Self-pay

## 2016-11-02 ENCOUNTER — Other Ambulatory Visit: Payer: Self-pay | Admitting: *Deleted

## 2016-11-02 DIAGNOSIS — R399 Unspecified symptoms and signs involving the genitourinary system: Secondary | ICD-10-CM

## 2016-11-02 DIAGNOSIS — N951 Menopausal and female climacteric states: Secondary | ICD-10-CM

## 2016-11-02 MED ORDER — ESTRADIOL 1.53 MG/SPRAY TD SOLN: 1.0000 | Freq: Every day | TRANSDERMAL | 12 refills | Status: DC

## 2016-11-02 MED ORDER — NITROFURANTOIN MONOHYD MACRO 100 MG PO CAPS: 100.0000 mg | ORAL_CAPSULE | Freq: Two times a day (BID) | ORAL | 0 refills | Status: DC

## 2016-11-02 NOTE — Telephone Encounter (Signed)
Macrobid Rx for UTI Referred to Orthopedics for backache

## 2016-11-02 NOTE — Telephone Encounter (Signed)
See previous note

## 2016-11-02 NOTE — Telephone Encounter (Signed)
Are you going to change her hormone therapy?

## 2016-11-02 NOTE — Telephone Encounter (Signed)
The patch is all we have to offer.  She should dry skin thoroughly before applying.

## 2016-11-02 NOTE — Telephone Encounter (Signed)
What about the Evamist Spray- it is covered by her insurance.

## 2016-11-03 ENCOUNTER — Other Ambulatory Visit: Payer: Self-pay | Admitting: Obstetrics

## 2016-11-03 DIAGNOSIS — N951 Menopausal and female climacteric states: Secondary | ICD-10-CM

## 2016-11-03 MED ORDER — ESTRADIOL 1.53 MG/SPRAY TD SOLN: 1.0000 | Freq: Every day | TRANSDERMAL | 12 refills | Status: DC

## 2016-12-07 ENCOUNTER — Other Ambulatory Visit (INDEPENDENT_AMBULATORY_CARE_PROVIDER_SITE_OTHER): Payer: Self-pay | Admitting: Physician Assistant

## 2016-12-07 DIAGNOSIS — Z760 Encounter for issue of repeat prescription: Secondary | ICD-10-CM

## 2016-12-07 DIAGNOSIS — I10 Essential (primary) hypertension: Secondary | ICD-10-CM

## 2016-12-07 MED ORDER — HYDROCHLOROTHIAZIDE 25 MG PO TABS: 25.0000 mg | ORAL_TABLET | Freq: Every day | ORAL | 1 refills | Status: DC
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
## 2017-04-07 ENCOUNTER — Other Ambulatory Visit: Payer: Self-pay | Admitting: Obstetrics

## 2017-04-07 DIAGNOSIS — F5101 Primary insomnia: Secondary | ICD-10-CM

## 2017-04-11 ENCOUNTER — Other Ambulatory Visit (INDEPENDENT_AMBULATORY_CARE_PROVIDER_SITE_OTHER): Payer: Self-pay | Admitting: Physician Assistant

## 2017-04-11 DIAGNOSIS — Z760 Encounter for issue of repeat prescription: Secondary | ICD-10-CM

## 2017-04-11 DIAGNOSIS — I10 Essential (primary) hypertension: Secondary | ICD-10-CM

## 2017-04-11 NOTE — Telephone Encounter (Signed)
FWD to PCP. Jaymir Struble S Lumina Gitto, CMA  

## 2017-05-19 ENCOUNTER — Emergency Department (HOSPITAL_COMMUNITY)
Admission: EM | Admit: 2017-05-19 | Discharge: 2017-05-19 | Disposition: A | Discharge status: Home / Self Care | Payer: Medicaid Other | Attending: Emergency Medicine | Admitting: Emergency Medicine

## 2017-05-19 ENCOUNTER — Emergency Department (HOSPITAL_COMMUNITY): Payer: Medicaid Other

## 2017-05-19 ENCOUNTER — Other Ambulatory Visit: Payer: Self-pay

## 2017-05-19 ENCOUNTER — Encounter (HOSPITAL_COMMUNITY): Payer: Self-pay

## 2017-05-19 DIAGNOSIS — F17210 Nicotine dependence, cigarettes, uncomplicated: Secondary | ICD-10-CM | POA: Diagnosis not present

## 2017-05-19 DIAGNOSIS — R05 Cough: Secondary | ICD-10-CM | POA: Insufficient documentation

## 2017-05-19 DIAGNOSIS — Z79899 Other long term (current) drug therapy: Secondary | ICD-10-CM | POA: Insufficient documentation

## 2017-05-19 DIAGNOSIS — I10 Essential (primary) hypertension: Secondary | ICD-10-CM | POA: Diagnosis not present

## 2017-05-19 DIAGNOSIS — R059 Cough, unspecified: Secondary | ICD-10-CM

## 2017-05-19 MED ORDER — BENZONATATE 100 MG PO CAPS
100.0000 mg | ORAL_CAPSULE | Freq: Once | ORAL | Status: AC
  Administered 2017-05-19: 100 mg via ORAL
  Filled 2017-05-19: qty 1

## 2017-05-19 MED ORDER — BENZONATATE 100 MG PO CAPS: 100.0000 mg | ORAL_CAPSULE | Freq: Three times a day (TID) | ORAL | 0 refills | Status: DC

## 2017-05-19 MED ORDER — FAMOTIDINE 20 MG PO TABS: 20.0000 mg | ORAL_TABLET | Freq: Two times a day (BID) | ORAL | 0 refills | Status: DC

## 2017-05-19 NOTE — ED Provider Notes (Signed)
[facility] Provider Note   CSN: [id number] Arrival date & time: 05/19/17  1533     History   Chief Complaint Chief Complaint  Patient presents with  . Cough    HPI [name] is a 52 y.o. female.  Patient reports a dry, hacking cough for 2 months. No fever/chills. Denies shortness of breath, wheezing, seasonal allergies, or reflux symptoms.   The history is provided by the patient. No language interpreter was used.  Cough  This is a chronic problem. The current episode started more than 1 week ago. The problem occurs every few minutes. The problem has been gradually worsening. The cough is non-productive. There has been no fever. Pertinent negatives include no chest pain, no chills, no rhinorrhea, no sore throat, no shortness of breath and no wheezing.    Past Medical History:  Diagnosis Date  . Carpal tunnel syndrome   . Hypercholesteremia   . Hypertension     There are no active problems to display for this patient.   Past Surgical History:  Procedure Laterality Date  . CARPAL TUNNEL RELEASE    . CESAREAN SECTION    . CHOLECYSTECTOMY      OB History    Gravida Para Term Preterm AB Living   5       2 3    SAB TAB Ectopic Multiple Live Births   1   1   3        Home Medications    Prior to Admission medications   Medication Sig Start Date End Date Taking? Authorizing Provider  cyclobenzaprine (FLEXERIL) 10 MG tablet Take 1 tablet (10 mg total) by mouth every 8 (eight) hours as needed for muscle spasms. 10/06/16   Brock BadHarper, Charles A, MD  estradiol (EVAMIST) 1.53 MG/SPRAY transdermal spray Place 1 spray onto the skin daily. Allow to dry for 2 minutes and do not wash for 30 minutes. 11/03/16   Brock BadHarper, Charles A, MD  hydrochlorothiazide (HYDRODIURIL) 25 MG tablet Take 1 tablet (25 mg total) by mouth daily. 04/11/17   Loletta SpecterGomez, Roger Hayzlee Mcsorley, PA-C  nitrofurantoin, macrocrystal-monohydrate, (MACROBID) 100 MG capsule Take 1 capsule  (100 mg total) by mouth 2 (two) times daily. 11/02/16   Brock BadHarper, Charles A, MD  oxyCODONE-acetaminophen (PERCOCET) 10-325 MG tablet Take 1 tablet by mouth every 4 (four) hours as needed for pain. 10/06/16   Brock BadHarper, Charles A, MD  progesterone (PROMETRIUM) 200 MG capsule TAKE 1 CAPSULE DAILY FOR 12 DAYS EVERY MONTH 11/03/16   Brock BadHarper, Charles A, MD  venlafaxine XR (EFFEXOR-XR) 75 MG 24 hr capsule Take 1 capsule (75 mg total) by mouth daily. 10/06/16   Brock BadHarper, Charles A, MD  zolpidem (AMBIEN) 5 MG tablet Take 1 tablet (5 mg total) by mouth at bedtime as needed for sleep. 10/06/16   Brock BadHarper, Charles A, MD    Family History Family History  Problem Relation Age of Onset  . Hypertension Mother   . Hypertension Father   . Cancer Maternal Grandfather     Social History Social History   Tobacco Use  . Smoking status: Current Every Day Smoker    Packs/day: 0.30    Types: Cigarettes  . Smokeless tobacco: Never Used  Substance Use Topics  . Alcohol use: No  . Drug use: No     Allergies   Ibuprofen; Benadryl [diphenhydramine]; and Sulfa antibiotics   Review of Systems Review of Systems  Constitutional: Negative for chills.  HENT: Negative for congestion, rhinorrhea and sore throat.  Respiratory: Positive for cough. Negative for shortness of breath and wheezing.   Cardiovascular: Negative for chest pain.  All other systems reviewed and are negative.    Physical Exam Updated Vital Signs BP 108/67 (BP Location: Right Arm)   Pulse (!) 114   Temp 98.6 F (37 C) (Oral)   Resp 18   SpO2 100%   Physical Exam  Constitutional: She is oriented to person, place, and time. She appears well-developed and well-nourished.  HENT:  Head: Normocephalic.  Eyes: Conjunctivae are normal.  Neck: Neck supple.  Cardiovascular: Normal rate and regular rhythm.  Pulmonary/Chest: Effort normal and breath sounds normal.  Abdominal: Soft. Bowel sounds are normal.  Musculoskeletal: Normal range of motion.  She exhibits no edema.  Lymphadenopathy:    She has no cervical adenopathy.  Neurological: She is alert and oriented to person, place, and time.  Skin: Skin is warm and dry.  Psychiatric: She has a normal mood and affect.  Nursing note and vitals reviewed.    ED Treatments / Results  Labs (all labs ordered are listed, but only abnormal results are displayed) Labs Reviewed - No data to display  EKG  EKG Interpretation None       Radiology No results found.  Procedures Procedures (including critical care time)  Medications Ordered in ED Medications  benzonatate (TESSALON) capsule 100 mg (not administered)     Initial Impression / Assessment and Plan / ED Course  I have reviewed the triage vital signs and the nursing notes.  Pertinent labs & imaging results that were available during my care of the patient were reviewed by me and considered in my medical decision making (see chart for details).     Patient with persistent cough. Prior history of reflux, no current treatment. CXR negative for acute infiltrate. Pt will be discharged with symptomatic treatment, including tessalon and pepcid.  Discussed return precautions.  Pt is hemodynamically stable & in NAD prior to discharge.  Final Clinical Impressions(s) / ED Diagnoses   Final diagnoses:  Cough    ED Discharge Orders        Ordered    benzonatate (TESSALON) 100 MG capsule  Every 8 hours     05/19/17 2025    famotidine (PEPCID) 20 MG tablet  2 times daily     05/19/17 2025       Felicie Morn, NP 05/19/17 2108    Arby Barrette, MD 05/24/17 1737

## 2017-05-19 NOTE — ED Triage Notes (Signed)
Pt states she has had a dry cough X2 months. States she has a tickle in her throat. Pt denies any other complaints.

## 2017-08-08 ENCOUNTER — Other Ambulatory Visit (INDEPENDENT_AMBULATORY_CARE_PROVIDER_SITE_OTHER): Payer: Self-pay | Admitting: Physician Assistant

## 2017-08-08 DIAGNOSIS — Z760 Encounter for issue of repeat prescription: Secondary | ICD-10-CM

## 2017-08-08 DIAGNOSIS — I10 Essential (primary) hypertension: Secondary | ICD-10-CM

## 2017-08-08 NOTE — Telephone Encounter (Signed)
Patient HCTZ refill request. Please refill if appropriate

## 2017-08-31 ENCOUNTER — Telehealth (INDEPENDENT_AMBULATORY_CARE_PROVIDER_SITE_OTHER): Payer: Self-pay | Admitting: Physician Assistant

## 2017-08-31 NOTE — Telephone Encounter (Signed)
Patient called requesting a Refill of  hydrochlorothiazide (HYDRODIURIL) 25 MG tablet  To [facility] [address] . Please, call her at [phone] . Thank you

## 2017-08-31 NOTE — Telephone Encounter (Signed)
Patient needs an appointment, she has not been seen in a year.

## 2017-09-01 NOTE — Telephone Encounter (Signed)
Orpha Bur  Can you please, call the patient and schedule her an appointment  She can get her med refill    Thank you

## 2017-11-03 ENCOUNTER — Encounter (HOSPITAL_COMMUNITY): Payer: Self-pay

## 2017-11-03 ENCOUNTER — Other Ambulatory Visit: Payer: Self-pay

## 2017-11-03 ENCOUNTER — Emergency Department (HOSPITAL_COMMUNITY)
Admission: EM | Admit: 2017-11-03 | Discharge: 2017-11-03 | Disposition: A | Discharge status: Home / Self Care | Payer: Medicaid Other | Attending: Emergency Medicine | Admitting: Emergency Medicine

## 2017-11-03 DIAGNOSIS — I10 Essential (primary) hypertension: Secondary | ICD-10-CM

## 2017-11-03 DIAGNOSIS — Z79899 Other long term (current) drug therapy: Secondary | ICD-10-CM | POA: Insufficient documentation

## 2017-11-03 DIAGNOSIS — R112 Nausea with vomiting, unspecified: Secondary | ICD-10-CM | POA: Insufficient documentation

## 2017-11-03 DIAGNOSIS — R197 Diarrhea, unspecified: Secondary | ICD-10-CM | POA: Insufficient documentation

## 2017-11-03 DIAGNOSIS — F17210 Nicotine dependence, cigarettes, uncomplicated: Secondary | ICD-10-CM | POA: Insufficient documentation

## 2017-11-03 LAB — COMPREHENSIVE METABOLIC PANEL
ALK PHOS: 71 U/L (ref 38–126)
ALT: 20 U/L (ref 0–44)
ANION GAP: 15 (ref 5–15)
AST: 33 U/L (ref 15–41)
Albumin: 3.2 g/dL — ABNORMAL LOW (ref 3.5–5.0)
BILIRUBIN TOTAL: 0.5 mg/dL (ref 0.3–1.2)
BUN: <5 mg/dL — ABNORMAL LOW (ref 6–20)
CALCIUM: 9.1 mg/dL (ref 8.9–10.3)
CO2: 22 mmol/L (ref 22–32)
CREATININE: 1.09 mg/dL — AB (ref 0.44–1.00)
Chloride: 105 mmol/L (ref 98–111)
GFR calc non Af Amer: 58 mL/min — ABNORMAL LOW (ref >60)
Glucose, Bld: 113 mg/dL — ABNORMAL HIGH (ref 70–99)
Potassium: 3.1 mmol/L — ABNORMAL LOW (ref 3.5–5.1)
Sodium: 142 mmol/L (ref 135–145)
TOTAL PROTEIN: 5.9 g/dL — AB (ref 6.5–8.1)

## 2017-11-03 LAB — URINALYSIS, ROUTINE W REFLEX MICROSCOPIC
BILIRUBIN URINE: NEGATIVE
Glucose, UA: NEGATIVE mg/dL
Hgb urine dipstick: NEGATIVE
Ketones, ur: 5 mg/dL — AB
Leukocytes, UA: NEGATIVE
NITRITE: NEGATIVE
PROTEIN: NEGATIVE mg/dL
SPECIFIC GRAVITY, URINE: 1.008 (ref 1.005–1.030)
pH: 9 — ABNORMAL HIGH (ref 5.0–8.0)

## 2017-11-03 LAB — CBC
HCT: 41.8 % (ref 36.0–46.0)
HEMOGLOBIN: 13.6 g/dL (ref 12.0–15.0)
MCH: 27.2 pg (ref 26.0–34.0)
MCHC: 32.5 g/dL (ref 30.0–36.0)
MCV: 83.6 fL (ref 78.0–100.0)
PLATELETS: 373 10*3/uL (ref 150–400)
RBC: 5 MIL/uL (ref 3.87–5.11)
RDW: 14.6 % (ref 11.5–15.5)
WBC: 5.7 10*3/uL (ref 4.0–10.5)

## 2017-11-03 LAB — I-STAT BETA HCG BLOOD, ED (MC, WL, AP ONLY): I-stat hCG, quantitative: <5 m[IU]/mL (ref <5)

## 2017-11-03 LAB — LIPASE, BLOOD: Lipase: 28 U/L (ref 11–51)

## 2017-11-03 MED ORDER — ONDANSETRON HCL 4 MG/2ML IJ SOLN
4.0000 mg | Freq: Once | INTRAMUSCULAR | Status: AC
  Administered 2017-11-03: 4 mg via INTRAVENOUS
  Filled 2017-11-03: qty 2

## 2017-11-03 MED ORDER — POTASSIUM CHLORIDE 10 MEQ/100ML IV SOLN
10.0000 meq | Freq: Once | INTRAVENOUS | Status: AC
  Administered 2017-11-03: 10 meq via INTRAVENOUS
  Filled 2017-11-03: qty 100

## 2017-11-03 MED ORDER — SODIUM CHLORIDE 0.9 % IV BOLUS
1000.0000 mL | Freq: Once | INTRAVENOUS | Status: AC
  Administered 2017-11-03: 1000 mL via INTRAVENOUS

## 2017-11-03 MED ORDER — FAMOTIDINE IN NACL 20-0.9 MG/50ML-% IV SOLN
20.0000 mg | Freq: Once | INTRAVENOUS | Status: AC
  Administered 2017-11-03: 20 mg via INTRAVENOUS
  Filled 2017-11-03: qty 50

## 2017-11-03 MED ORDER — ONDANSETRON 4 MG PO TBDP
4.0000 mg | ORAL_TABLET | Freq: Once | ORAL | Status: AC | PRN
Start: 2017-11-03 — End: 2017-11-03
  Administered 2017-11-03: 4 mg via ORAL
  Filled 2017-11-03: qty 1

## 2017-11-03 MED ORDER — LOPERAMIDE HCL 2 MG PO CAPS: 2.0000 mg | ORAL_CAPSULE | Freq: Four times a day (QID) | ORAL | 0 refills | Status: AC | PRN

## 2017-11-03 MED ORDER — ONDANSETRON HCL 4 MG PO TABS: 4.0000 mg | ORAL_TABLET | Freq: Four times a day (QID) | ORAL | 0 refills | Status: AC

## 2017-11-03 MED ORDER — LOPERAMIDE HCL 2 MG PO CAPS
4.0000 mg | ORAL_CAPSULE | Freq: Once | ORAL | Status: AC
  Administered 2017-11-03: 4 mg via ORAL
  Filled 2017-11-03: qty 2

## 2017-11-03 NOTE — ED Provider Notes (Signed)
[facility] EMERGENCY DEPARTMENT Provider Note  CSN: [id number] Arrival date & time: 11/03/17  1153  History   Chief Complaint Chief Complaint  Patient presents with  . Emesis    HPI [name] is a 52 y.o. female with a medical history of HTN who presented to the ED for vomiting and diarrhea x1 day. Associated symptoms: abdominal pain. Symptoms began this morning shortly after waking up. Denies fever, chills, changes in urinary habits, headache or rash. Denies chest pain, SOB, palpitations and leg swelling. Patient denies recent travel, new exposures or sick contacts.   Past Medical History:  Diagnosis Date  . Carpal tunnel syndrome   . Hypercholesteremia   . Hypertension     There are no active problems to display for this patient.   Past Surgical History:  Procedure Laterality Date  . CARPAL TUNNEL RELEASE    . CESAREAN SECTION    . CHOLECYSTECTOMY       OB History    Gravida  5   Para      Term      Preterm      AB  2   Living  3     SAB  1   TAB      Ectopic  1   Multiple      Live Births  3            Home Medications    Prior to Admission medications   Medication Sig Start Date End Date Taking? Authorizing Provider  benzonatate (TESSALON) 100 MG capsule Take 1 capsule (100 mg total) by mouth every 8 (eight) hours. 05/19/17   Felicie Morn, NP  cyclobenzaprine (FLEXERIL) 10 MG tablet Take 1 tablet (10 mg total) by mouth every 8 (eight) hours as needed for muscle spasms. 10/06/16   Brock Bad, MD  estradiol (EVAMIST) 1.53 MG/SPRAY transdermal spray Place 1 spray onto the skin daily. Allow to dry for 2 minutes and do not wash for 30 minutes. 11/03/16   Brock Bad, MD  famotidine (PEPCID) 20 MG tablet Take 1 tablet (20 mg total) by mouth 2 (two) times daily. 05/19/17   Felicie Morn, NP  hydrochlorothiazide (HYDRODIURIL) 25 MG tablet Take 1 tablet (25 mg total) by mouth daily. 04/11/17   Loletta Specter, PA-C    loperamide (IMODIUM) 2 MG capsule Take 1 capsule (2 mg total) by mouth 4 (four) times daily as needed for up to 7 days for diarrhea or loose stools. 11/03/17 11/10/17  Kourtlyn Charlet, Jerrel Ivory I, PA-C  nitrofurantoin, macrocrystal-monohydrate, (MACROBID) 100 MG capsule Take 1 capsule (100 mg total) by mouth 2 (two) times daily. 11/02/16   Brock Bad, MD  ondansetron (ZOFRAN) 4 MG tablet Take 1 tablet (4 mg total) by mouth every 6 (six) hours for 7 days. 11/03/17 11/10/17  Rickayla Wieland, Sharyon Medicus, PA-C  oxyCODONE-acetaminophen (PERCOCET) 10-325 MG tablet Take 1 tablet by mouth every 4 (four) hours as needed for pain. 10/06/16   Brock Bad, MD  progesterone (PROMETRIUM) 200 MG capsule TAKE 1 CAPSULE DAILY FOR 12 DAYS EVERY MONTH 11/03/16   Brock Bad, MD  venlafaxine XR (EFFEXOR-XR) 75 MG 24 hr capsule Take 1 capsule (75 mg total) by mouth daily. 10/06/16   Brock Bad, MD  zolpidem (AMBIEN) 5 MG tablet Take 1 tablet (5 mg total) by mouth at bedtime as needed for sleep. 10/06/16   Brock Bad, MD    Family History Family History  Problem Relation  Age of Onset  . Hypertension Mother   . Hypertension Father   . Cancer Maternal Grandfather     Social History Social History   Tobacco Use  . Smoking status: Current Every Day Smoker    Packs/day: 0.30    Types: Cigarettes  . Smokeless tobacco: Never Used  Substance Use Topics  . Alcohol use: No  . Drug use: No     Allergies   Ibuprofen; Benadryl [diphenhydramine]; and Sulfa antibiotics   Review of Systems Review of Systems  Constitutional: Negative for chills, diaphoresis, fatigue and fever.  Respiratory: Negative.   Cardiovascular: Negative.   Gastrointestinal: Positive for abdominal pain, diarrhea, nausea and vomiting. Negative for constipation.  Skin: Negative.   Neurological: Negative for dizziness, weakness, light-headedness, numbness and headaches.     Physical Exam Updated Vital Signs BP (!) 182/101    Pulse 71   Temp 98.2 F (36.8 C) (Oral)   Resp 20   Ht 5\' 3"  (1.6 m)   Wt 65.3 kg (144 lb)   SpO2 100%   BMI 25.51 kg/m   Physical Exam  Constitutional: She appears well-developed and well-nourished.  HENT:  Mouth/Throat: Uvula is midline, oropharynx is clear and moist and mucous membranes are normal.  Cardiovascular: Normal rate, regular rhythm and normal heart sounds.  Pulmonary/Chest: Effort normal and breath sounds normal.  Abdominal: Soft. Bowel sounds are normal. She exhibits no distension. There is no tenderness. There is no rigidity and no guarding.  Skin: Skin is warm and intact. She is not diaphoretic. No pallor.  Nursing note and vitals reviewed.    ED Treatments / Results  Labs (all labs ordered are listed, but only abnormal results are displayed) Labs Reviewed  COMPREHENSIVE METABOLIC PANEL - Abnormal; Notable for the following components:      Result Value   Potassium 3.1 (*)    Glucose, Bld 113 (*)    BUN <5 (*)    Creatinine, Ser 1.09 (*)    Total Protein 5.9 (*)    Albumin 3.2 (*)    GFR calc non Af Amer 58 (*)    All other components within normal limits  URINALYSIS, ROUTINE W REFLEX MICROSCOPIC - Abnormal; Notable for the following components:   Color, Urine STRAW (*)    pH 9.0 (*)    Ketones, ur 5 (*)    All other components within normal limits  LIPASE, BLOOD  CBC  I-STAT BETA HCG BLOOD, ED ([facility], [facility], [facility] ONLY)    EKG EKG Interpretation  Date/Time:  Thursday November 03 2017 12:00:00 EDT Ventricular Rate:  106 PR Interval:  148 QRS Duration: 80 QT Interval:  404 QTC Calculation: 536 R Axis:   110 Text Interpretation:  Sinus tachycardia Possible Left atrial enlargement Left posterior fascicular block Prolonged QT Abnormal ECG Confirmed by Kristine RoyalMessick, Peter [phone]([id number]) on 11/03/2017 4:02:29 PM   Radiology No results found.  Procedures Procedures (including critical care time)  Medications Ordered in ED Medications  sodium chloride 0.9 % bolus  1,000 mL (1,000 mLs Intravenous New Bag/Given 11/03/17 1540)  potassium chloride 10 mEq in 100 mL IVPB (10 mEq Intravenous New Bag/Given 11/03/17 1539)  ondansetron (ZOFRAN-ODT) disintegrating tablet 4 mg (4 mg Oral Given 11/03/17 1219)  ondansetron (ZOFRAN) injection 4 mg (4 mg Intravenous Given 11/03/17 1340)  famotidine (PEPCID) IVPB 20 mg premix (0 mg Intravenous Stopped 11/03/17 1433)  sodium chloride 0.9 % bolus 1,000 mL (0 mLs Intravenous Stopped 11/03/17 1440)  ondansetron (ZOFRAN) injection 4 mg (4 mg  Intravenous Given 11/03/17 1540)  loperamide (IMODIUM) capsule 4 mg (4 mg Oral Given 11/03/17 1540)     Initial Impression / Assessment and Plan / ED Course  Triage vital signs and the nursing notes have been reviewed.  Pertinent labs & imaging results that were available during care of the patient were reviewed and considered in medical decision making (see chart for details).  Patient presents in no acute distress. She vomited once in the ED prior to receiving antiemetic. History is consistent with a viral gastroenteritis. Physical exam is unremarkable and lab work does not reflect any significant abnormalities that would suggest an infectious or metabolic etiology. Abdominal exam is negative and there are no indications for imaging or raise additional concern for an acute intra-abdominal process.  Clinical Course as of Nov 04 1611  Thu Nov 03, 2017  1424 Decreased K+, but no accompanying abnormalities on EKG or cardiac complaints today. Will administer of K+ today and advise follow-up with PCP. Slightly elevated creatinine likely due to dehydration from vomiting and diarrhea. Remaining labs unremarkable.   [GM]  1519 Patient reports some relief after IV fluids, Zofran and Pepcid.    [GM]    Clinical Course User Index [GM] Noell Lorensen, Sharyon Medicus, PA-C    Final Clinical Impressions(s) / ED Diagnoses  1. Viral Gastroenteritis. Relief achieved with IV fluids, antiemetic and  antidiarrheal. Education provided on OTC and supportive treatment for symptom relief. Advised to follow-up with PCP for repeat BMP for potassium. 2. Elevated BP. Patient reports having diagnosis of HTN, but has been without antihypertensive medication. She reports taking her cousin's antihypertensive (unable to recall name) over the last couple of days. No signs of end organ damage that warrant additional evaluation. Advised to follow-up with PCP.  Dispo: Home. After thorough clinical evaluation, this patient is determined to be medically stable and can be safely discharged with the previously mentioned treatment and/or outpatient follow-up/referral(s). At this time, there are no other apparent medical conditions that require further screening, evaluation or treatment.  Final diagnoses:  Nausea vomiting and diarrhea  Essential hypertension    ED Discharge Orders        Ordered    ondansetron (ZOFRAN) 4 MG tablet  Every 6 hours     11/03/17 1547    loperamide (IMODIUM) 2 MG capsule  4 times daily PRN     11/03/17 1547        Gevork Ayyad, [place] I, PA-C 11/03/17 1613    Wynetta Fines, MD 11/04/17 [phone]

## 2017-11-03 NOTE — ED Notes (Signed)
Patient Alert and oriented to baseline. Stable and ambulatory to baseline. Patient verbalized understanding of the discharge instructions.  Patient belongings were taken by the patient.   

## 2017-11-03 NOTE — Discharge Instructions (Addendum)
You likely have viral GI bug. Symptoms should improve over the next few days. I have prescribed you medication to help with nausea/vomiting and diarrhea. Be sure to stay hydrated and drink plenty of water!  You may follow-up with your PCP if you continue to have these symptoms beyond a week.  Be sure to follow-up with your PCP to discuss high blood pressure and get medication refill..Marland Kitchen

## 2017-11-03 NOTE — ED Triage Notes (Addendum)
Pt endorses that "both of my hands locked up at 1130 and my ankles have been swelling x 3 days" Pt vomiting in triage, also complains of diarrhea. Has hx of htn and "I've been taking someone else's blood pressure medications"

## 2018-08-30 ENCOUNTER — Encounter (HOSPITAL_COMMUNITY): Payer: Self-pay

## 2018-08-30 ENCOUNTER — Emergency Department (HOSPITAL_COMMUNITY): Payer: Medicaid Other

## 2018-08-30 ENCOUNTER — Emergency Department (HOSPITAL_COMMUNITY)
Admission: EM | Admit: 2018-08-30 | Discharge: 2018-08-30 | Disposition: A | Discharge status: Home / Self Care | Payer: Medicaid Other | Attending: Emergency Medicine | Admitting: Emergency Medicine

## 2018-08-30 ENCOUNTER — Other Ambulatory Visit: Payer: Self-pay

## 2018-08-30 DIAGNOSIS — F1123 Opioid dependence with withdrawal: Secondary | ICD-10-CM | POA: Insufficient documentation

## 2018-08-30 DIAGNOSIS — F1990 Other psychoactive substance use, unspecified, uncomplicated: Secondary | ICD-10-CM | POA: Insufficient documentation

## 2018-08-30 DIAGNOSIS — Z91128 Patient's intentional underdosing of medication regimen for other reason: Secondary | ICD-10-CM | POA: Insufficient documentation

## 2018-08-30 DIAGNOSIS — Z79899 Other long term (current) drug therapy: Secondary | ICD-10-CM | POA: Insufficient documentation

## 2018-08-30 DIAGNOSIS — F1290 Cannabis use, unspecified, uncomplicated: Secondary | ICD-10-CM | POA: Insufficient documentation

## 2018-08-30 DIAGNOSIS — F1910 Other psychoactive substance abuse, uncomplicated: Secondary | ICD-10-CM

## 2018-08-30 DIAGNOSIS — F1193 Opioid use, unspecified with withdrawal: Secondary | ICD-10-CM

## 2018-08-30 DIAGNOSIS — R000 Tachycardia, unspecified: Secondary | ICD-10-CM | POA: Insufficient documentation

## 2018-08-30 DIAGNOSIS — R110 Nausea: Secondary | ICD-10-CM | POA: Insufficient documentation

## 2018-08-30 DIAGNOSIS — F17210 Nicotine dependence, cigarettes, uncomplicated: Secondary | ICD-10-CM | POA: Insufficient documentation

## 2018-08-30 DIAGNOSIS — Z760 Encounter for issue of repeat prescription: Secondary | ICD-10-CM

## 2018-08-30 DIAGNOSIS — I10 Essential (primary) hypertension: Secondary | ICD-10-CM

## 2018-08-30 DIAGNOSIS — R531 Weakness: Secondary | ICD-10-CM | POA: Insufficient documentation

## 2018-08-30 DIAGNOSIS — T465X6A Underdosing of other antihypertensive drugs, initial encounter: Secondary | ICD-10-CM | POA: Insufficient documentation

## 2018-08-30 DIAGNOSIS — Z9114 Patient's other noncompliance with medication regimen: Secondary | ICD-10-CM

## 2018-08-30 DIAGNOSIS — M7918 Myalgia, other site: Secondary | ICD-10-CM | POA: Insufficient documentation

## 2018-08-30 LAB — RAPID URINE DRUG SCREEN, HOSP PERFORMED
Amphetamines: NOT DETECTED
Barbiturates: NOT DETECTED
Benzodiazepines: NOT DETECTED
Cocaine: POSITIVE — AB
Opiates: POSITIVE — AB
Tetrahydrocannabinol: POSITIVE — AB

## 2018-08-30 LAB — COMPREHENSIVE METABOLIC PANEL
ALT: 23 U/L (ref 0–44)
AST: 25 U/L (ref 15–41)
Albumin: 3.9 g/dL (ref 3.5–5.0)
Alkaline Phosphatase: 71 U/L (ref 38–126)
Anion gap: 18 — ABNORMAL HIGH (ref 5–15)
BUN: 6 mg/dL (ref 6–20)
CO2: 21 mmol/L — ABNORMAL LOW (ref 22–32)
Calcium: 9.5 mg/dL (ref 8.9–10.3)
Chloride: 98 mmol/L (ref 98–111)
Creatinine, Ser: 0.9 mg/dL (ref 0.44–1.00)
GFR calc Af Amer: >60 mL/min (ref >60)
GFR calc non Af Amer: >60 mL/min (ref >60)
Glucose, Bld: 99 mg/dL (ref 70–99)
Potassium: 3.4 mmol/L — ABNORMAL LOW (ref 3.5–5.1)
Sodium: 137 mmol/L (ref 135–145)
Total Bilirubin: 0.7 mg/dL (ref 0.3–1.2)
Total Protein: 7.5 g/dL (ref 6.5–8.1)

## 2018-08-30 LAB — URINALYSIS, ROUTINE W REFLEX MICROSCOPIC
Bilirubin Urine: NEGATIVE
Glucose, UA: NEGATIVE mg/dL
Hgb urine dipstick: NEGATIVE
Ketones, ur: 20 mg/dL — AB
Leukocytes,Ua: NEGATIVE
Nitrite: NEGATIVE
Protein, ur: NEGATIVE mg/dL
Specific Gravity, Urine: 1.008 (ref 1.005–1.030)
pH: 8 (ref 5.0–8.0)

## 2018-08-30 LAB — CBC WITH DIFFERENTIAL/PLATELET
Abs Immature Granulocytes: 0.02 10*3/uL (ref 0.00–0.07)
Basophils Absolute: 0 10*3/uL (ref 0.0–0.1)
Basophils Relative: 0 %
Eosinophils Absolute: 0 10*3/uL (ref 0.0–0.5)
Eosinophils Relative: 0 %
HCT: 38 % (ref 36.0–46.0)
Hemoglobin: 12.6 g/dL (ref 12.0–15.0)
Immature Granulocytes: 0 %
Lymphocytes Relative: 18 %
Lymphs Abs: 1.3 10*3/uL (ref 0.7–4.0)
MCH: 27.8 pg (ref 26.0–34.0)
MCHC: 33.2 g/dL (ref 30.0–36.0)
MCV: 83.7 fL (ref 80.0–100.0)
Monocytes Absolute: 0.4 10*3/uL (ref 0.1–1.0)
Monocytes Relative: 5 %
Neutro Abs: 5.5 10*3/uL (ref 1.7–7.7)
Neutrophils Relative %: 77 %
Platelets: 350 10*3/uL (ref 150–400)
RBC: 4.54 MIL/uL (ref 3.87–5.11)
RDW: 13.2 % (ref 11.5–15.5)
WBC: 7.2 10*3/uL (ref 4.0–10.5)
nRBC: 0 % (ref 0.0–0.2)

## 2018-08-30 LAB — SALICYLATE LEVEL: Salicylate Lvl: <7 mg/dL (ref 2.8–30.0)

## 2018-08-30 LAB — ACETAMINOPHEN LEVEL: Acetaminophen (Tylenol), Serum: <10 ug/mL — ABNORMAL LOW (ref 10–30)

## 2018-08-30 LAB — ETHANOL: Alcohol, Ethyl (B): <10 mg/dL (ref <10)

## 2018-08-30 MED ORDER — LOPERAMIDE HCL 2 MG PO CAPS: 2.0000 mg | ORAL_CAPSULE | ORAL | Status: DC | PRN

## 2018-08-30 MED ORDER — METHOCARBAMOL 500 MG PO TABS
500.0000 mg | ORAL_TABLET | Freq: Three times a day (TID) | ORAL | Status: DC | PRN
  Administered 2018-08-30: 500 mg via ORAL
  Filled 2018-08-30: qty 1

## 2018-08-30 MED ORDER — HYDROCHLOROTHIAZIDE 25 MG PO TABS: 25.0000 mg | ORAL_TABLET | Freq: Every day | ORAL | 0 refills | Status: DC

## 2018-08-30 MED ORDER — DICYCLOMINE HCL 20 MG PO TABS
20.0000 mg | ORAL_TABLET | Freq: Four times a day (QID) | ORAL | Status: DC | PRN
  Administered 2018-08-30: 20 mg via ORAL
  Filled 2018-08-30: qty 1

## 2018-08-30 MED ORDER — ONDANSETRON 4 MG PO TBDP: 4.0000 mg | ORAL_TABLET | Freq: Three times a day (TID) | ORAL | 0 refills | Status: DC | PRN

## 2018-08-30 MED ORDER — METHOCARBAMOL 500 MG PO TABS: 500.0000 mg | ORAL_TABLET | Freq: Two times a day (BID) | ORAL | 0 refills | Status: DC

## 2018-08-30 MED ORDER — ONDANSETRON HCL 4 MG/2ML IJ SOLN
4.0000 mg | Freq: Once | INTRAMUSCULAR | Status: AC
  Administered 2018-08-30: 4 mg via INTRAVENOUS
  Filled 2018-08-30: qty 2

## 2018-08-30 MED ORDER — CLONIDINE HCL 0.1 MG PO TABS: 0.1000 mg | ORAL_TABLET | Freq: Two times a day (BID) | ORAL | Status: DC

## 2018-08-30 MED ORDER — HYDRALAZINE HCL 20 MG/ML IJ SOLN
20.0000 mg | Freq: Once | INTRAMUSCULAR | Status: AC
  Administered 2018-08-30: 20 mg via INTRAVENOUS
  Filled 2018-08-30: qty 1

## 2018-08-30 MED ORDER — SODIUM CHLORIDE 0.9 % IV BOLUS
1000.0000 mL | Freq: Once | INTRAVENOUS | Status: AC
  Administered 2018-08-30: 1000 mL via INTRAVENOUS

## 2018-08-30 MED ORDER — ACETAMINOPHEN 500 MG PO TABS
1000.0000 mg | ORAL_TABLET | Freq: Once | ORAL | Status: AC
  Administered 2018-08-30: 1000 mg via ORAL
  Filled 2018-08-30: qty 2

## 2018-08-30 MED ORDER — CLONIDINE HCL 0.1 MG PO TABS: 0.1000 mg | ORAL_TABLET | Freq: Every day | ORAL | Status: DC

## 2018-08-30 MED ORDER — CLONIDINE HCL 0.1 MG PO TABS
0.1000 mg | ORAL_TABLET | Freq: Four times a day (QID) | ORAL | Status: DC
  Administered 2018-08-30: 0.1 mg via ORAL
  Filled 2018-08-30: qty 1

## 2018-08-30 MED ORDER — HYDROXYZINE HCL 25 MG PO TABS
25.0000 mg | ORAL_TABLET | Freq: Four times a day (QID) | ORAL | Status: DC | PRN
  Administered 2018-08-30: 25 mg via ORAL
  Filled 2018-08-30: qty 1

## 2018-08-30 NOTE — ED Notes (Signed)
COWS score is 17

## 2018-08-30 NOTE — ED Triage Notes (Addendum)
Per pt: She is trying to quit heroin, her last use was yesterday. She states that she would snort. Pt states that she took imodium. Pt placed "a pain patch" on her back, thinks it may have been icy hot. Pt states that she has back pain and abdominal pain. Pt states that she is nauseated. Pt states that this feels like her withdrawal symptoms from the last time she stopped using less than a year ago. Pt denies taking any other drugs. Denies alcohol use.

## 2018-08-30 NOTE — ED Provider Notes (Signed)
[facility] EMERGENCY DEPARTMENT Provider Note   CSN: [id number] Arrival date & time: 08/30/18  1439    History   Chief Complaint Chief Complaint  Patient presents with  . Heroin Withdrawl    HPI [name] is a 53 y.o. female.     Pt presents to the ED today with heroin withdrawal.  Pt has a hx of heroin abuse and stopped using "cold [place]" yesterday.  The pt said she has been nauseous and has body aches.  She also has HTN and has been unable to take her meds for the past year.     Past Medical History:  Diagnosis Date  . Carpal tunnel syndrome   . Hypercholesteremia   . Hypertension     There are no active problems to display for this patient.   Past Surgical History:  Procedure Laterality Date  . CARPAL TUNNEL RELEASE    . CESAREAN SECTION    . CHOLECYSTECTOMY       OB History    Gravida  5   Para      Term      Preterm      AB  2   Living  3     SAB  1   TAB      Ectopic  1   Multiple      Live Births  3            Home Medications    Prior to Admission medications   Medication Sig Start Date End Date Taking? Authorizing Provider  benzonatate (TESSALON) 100 MG capsule Take 1 capsule (100 mg total) by mouth every 8 (eight) hours. 05/19/17   Felicie Morn, NP  cyclobenzaprine (FLEXERIL) 10 MG tablet Take 1 tablet (10 mg total) by mouth every 8 (eight) hours as needed for muscle spasms. 10/06/16   Brock Bad, MD  estradiol (EVAMIST) 1.53 MG/SPRAY transdermal spray Place 1 spray onto the skin daily. Allow to dry for 2 minutes and do not wash for 30 minutes. 11/03/16   Brock Bad, MD  famotidine (PEPCID) 20 MG tablet Take 1 tablet (20 mg total) by mouth 2 (two) times daily. 05/19/17   Felicie Morn, NP  hydrochlorothiazide (HYDRODIURIL) 25 MG tablet Take 1 tablet (25 mg total) by mouth daily. 08/30/18   Jacalyn Lefevre, MD  methocarbamol (ROBAXIN) 500 MG tablet Take 1 tablet (500 mg total) by mouth 2 (two)  times daily. 08/30/18   Jacalyn Lefevre, MD  nitrofurantoin, macrocrystal-monohydrate, (MACROBID) 100 MG capsule Take 1 capsule (100 mg total) by mouth 2 (two) times daily. 11/02/16   Brock Bad, MD  ondansetron (ZOFRAN ODT) 4 MG disintegrating tablet Take 1 tablet (4 mg total) by mouth every 8 (eight) hours as needed. 08/30/18   Jacalyn Lefevre, MD  oxyCODONE-acetaminophen (PERCOCET) 10-325 MG tablet Take 1 tablet by mouth every 4 (four) hours as needed for pain. 10/06/16   Brock Bad, MD  progesterone (PROMETRIUM) 200 MG capsule TAKE 1 CAPSULE DAILY FOR 12 DAYS EVERY MONTH 11/03/16   Brock Bad, MD  venlafaxine XR (EFFEXOR-XR) 75 MG 24 hr capsule Take 1 capsule (75 mg total) by mouth daily. 10/06/16   Brock Bad, MD  zolpidem (AMBIEN) 5 MG tablet Take 1 tablet (5 mg total) by mouth at bedtime as needed for sleep. 10/06/16   Brock Bad, MD    Family History Family History  Problem Relation Age of Onset  . Hypertension Mother   .  Hypertension Father   . Cancer Maternal Grandfather     Social History Social History   Tobacco Use  . Smoking status: Current Every Day Smoker    Packs/day: 0.30    Types: Cigarettes  . Smokeless tobacco: Never Used  Substance Use Topics  . Alcohol use: No  . Drug use: Yes    Comment: Heroin use, last use 08/29/2018     Allergies   Ibuprofen; Benadryl [diphenhydramine]; and Sulfa antibiotics   Review of Systems Review of Systems  Gastrointestinal: Positive for nausea.  Musculoskeletal: Positive for myalgias.  Neurological: Positive for weakness.  All other systems reviewed and are negative.    Physical Exam Updated Vital Signs BP (!) 147/94   Pulse (!) 121   Temp 98.4 F (36.9 C) (Oral)   Resp 13   SpO2 100%   Physical Exam Vitals signs and nursing note reviewed.  Constitutional:      Appearance: Normal appearance.  HENT:     Head: Normocephalic and atraumatic.     Right Ear: External ear normal.     Left  Ear: External ear normal.     Nose: Nose normal.     Mouth/Throat:     Mouth: Mucous membranes are dry.     Pharynx: Oropharynx is clear.  Eyes:     Extraocular Movements: Extraocular movements intact.     Conjunctiva/sclera: Conjunctivae normal.     Pupils: Pupils are equal, round, and reactive to light.  Neck:     Musculoskeletal: Normal range of motion and neck supple.  Cardiovascular:     Rate and Rhythm: Regular rhythm. Tachycardia present.     Pulses: Normal pulses.     Heart sounds: Normal heart sounds.  Pulmonary:     Effort: Pulmonary effort is normal.     Breath sounds: Normal breath sounds.  Abdominal:     General: Abdomen is flat. Bowel sounds are normal.     Palpations: Abdomen is soft.  Musculoskeletal: Normal range of motion.  Skin:    General: Skin is warm.     Capillary Refill: Capillary refill takes less than 2 seconds.  Neurological:     General: No focal deficit present.     Mental Status: She is alert and oriented to person, place, and time.  Psychiatric:        Mood and Affect: Mood normal.        Behavior: Behavior normal.      ED Treatments / Results  Labs (all labs ordered are listed, but only abnormal results are displayed) Labs Reviewed  COMPREHENSIVE METABOLIC PANEL - Abnormal; Notable for the following components:      Result Value   Potassium 3.4 (*)    CO2 21 (*)    Anion gap 18 (*)    All other components within normal limits  RAPID URINE DRUG SCREEN, HOSP PERFORMED - Abnormal; Notable for the following components:   Opiates POSITIVE (*)    Cocaine POSITIVE (*)    Tetrahydrocannabinol POSITIVE (*)    All other components within normal limits  ACETAMINOPHEN LEVEL - Abnormal; Notable for the following components:   Acetaminophen (Tylenol), Serum <10 (*)    All other components within normal limits  URINALYSIS, ROUTINE W REFLEX MICROSCOPIC - Abnormal; Notable for the following components:   APPearance HAZY (*)    Ketones, ur 20 (*)     All other components within normal limits  ETHANOL  CBC WITH DIFFERENTIAL/PLATELET  SALICYLATE LEVEL    EKG EKG  Interpretation  Date/Time:  Wednesday Aug 30 2018 14:49:24 EDT Ventricular Rate:  133 PR Interval:    QRS Duration: 76 QT Interval:  266 QTC Calculation: 396 R Axis:   106 Text Interpretation:  Sinus tachycardia Right axis deviation Borderline low voltage, extremity leads No significant change since last tracing Confirmed by Jacalyn Lefevre [phone]) on 08/30/2018 3:45:42 PM   Radiology Dg Chest Port 1 View  Result Date: 08/30/2018 CLINICAL DATA:  Left-sided chest pain, shortness of breath EXAM: PORTABLE CHEST 1 VIEW COMPARISON:  05/19/2017 FINDINGS: Cardiomegaly. Both lungs are clear. There is a partially callused fracture of the lateral left fifth rib. IMPRESSION: 1.  Cardiomegaly without acute abnormality of the lungs. 2. There is a partially callused fracture of the lateral left fifth rib. Electronically Signed   By: Lauralyn Primes M.D.   On: 08/30/2018 15:50    Procedures Procedures (including critical care time)  Medications Ordered in ED Medications  cloNIDine (CATAPRES) tablet 0.1 mg (0.1 mg Oral Given 08/30/18 1545)    Followed by  cloNIDine (CATAPRES) tablet 0.1 mg (has no administration in time range)    Followed by  cloNIDine (CATAPRES) tablet 0.1 mg (has no administration in time range)  dicyclomine (BENTYL) tablet 20 mg (20 mg Oral Given 08/30/18 1604)  hydrOXYzine (ATARAX/VISTARIL) tablet 25 mg (25 mg Oral Given 08/30/18 1545)  loperamide (IMODIUM) capsule 2-4 mg (has no administration in time range)  methocarbamol (ROBAXIN) tablet 500 mg (500 mg Oral Given 08/30/18 1545)  ondansetron (ZOFRAN) injection 4 mg (4 mg Intravenous Given 08/30/18 1544)  sodium chloride 0.9 % bolus 1,000 mL (0 mLs Intravenous Stopped 08/30/18 1730)  hydrALAZINE (APRESOLINE) injection 20 mg (20 mg Intravenous Given 08/30/18 1734)  acetaminophen (TYLENOL) tablet 1,000 mg (1,000 mg Oral Given  08/30/18 1733)     Initial Impression / Assessment and Plan / ED Course  I have reviewed the triage vital signs and the nursing notes.  Pertinent labs & imaging results that were available during my care of the patient were reviewed by me and considered in my medical decision making (see chart for details).    BP has improved after treatment.  HR is still elevated, but pt is awake and alert and in no distress.  It is likely from cocaine.  The pt is instructed to take her bp meds as directed.  DASH diet.  She is given Facilities manager for outpatient rehab programs.  Return if worse.  Final Clinical Impressions(s) / ED Diagnoses   Final diagnoses:  Weakness  Polysubstance abuse (HCC)  Heroin withdrawal (HCC)  Essential hypertension  Noncompliance with medications    ED Discharge Orders         Ordered    ondansetron (ZOFRAN ODT) 4 MG disintegrating tablet  Every 8 hours PRN     08/30/18 1816    hydrochlorothiazide (HYDRODIURIL) 25 MG tablet  Daily    Note to Pharmacy:  Patient needs to office visit for future refills.   08/30/18 1816    methocarbamol (ROBAXIN) 500 MG tablet  2 times daily     08/30/18 1816           Jacalyn Lefevre, MD 08/30/18 1818

## 2018-08-30 NOTE — ED Notes (Signed)
Dr. Haviland at bedside. 

## 2018-08-30 NOTE — ED Notes (Signed)
Pt requesting "something for my nausea, I'm really nauseated. And something for my pain, I'm in a lot of pain". Pt having a difficult time staying still in bed.

## 2018-08-30 NOTE — ED Notes (Signed)
Pt ambulating to rest room.

## 2021-10-12 ENCOUNTER — Other Ambulatory Visit (HOSPITAL_COMMUNITY)
Admission: EM | Admit: 2021-10-12 | Discharge: 2021-10-15 | Disposition: A | Discharge status: Other Acute Inpatient Hospital | Payer: Medicaid Other | Attending: Psychiatry | Admitting: Psychiatry

## 2021-10-12 DIAGNOSIS — F1420 Cocaine dependence, uncomplicated: Secondary | ICD-10-CM | POA: Diagnosis not present

## 2021-10-12 DIAGNOSIS — Z20822 Contact with and (suspected) exposure to covid-19: Secondary | ICD-10-CM | POA: Diagnosis not present

## 2021-10-12 DIAGNOSIS — F1123 Opioid dependence with withdrawal: Secondary | ICD-10-CM | POA: Insufficient documentation

## 2021-10-12 DIAGNOSIS — Z7982 Long term (current) use of aspirin: Secondary | ICD-10-CM | POA: Insufficient documentation

## 2021-10-12 DIAGNOSIS — F17210 Nicotine dependence, cigarettes, uncomplicated: Secondary | ICD-10-CM | POA: Diagnosis not present

## 2021-10-12 DIAGNOSIS — F1910 Other psychoactive substance abuse, uncomplicated: Secondary | ICD-10-CM

## 2021-10-12 DIAGNOSIS — F1920 Other psychoactive substance dependence, uncomplicated: Secondary | ICD-10-CM | POA: Diagnosis present

## 2021-10-12 DIAGNOSIS — Z79899 Other long term (current) drug therapy: Secondary | ICD-10-CM | POA: Insufficient documentation

## 2021-10-12 DIAGNOSIS — Z5900 Homelessness unspecified: Secondary | ICD-10-CM | POA: Insufficient documentation

## 2021-10-12 LAB — CBC WITH DIFFERENTIAL/PLATELET
Abs Immature Granulocytes: 0.01 10*3/uL (ref 0.00–0.07)
Basophils Absolute: 0 10*3/uL (ref 0.0–0.1)
Basophils Relative: 0 %
Eosinophils Absolute: 0 10*3/uL (ref 0.0–0.5)
Eosinophils Relative: 0 %
HCT: 36.8 % (ref 36.0–46.0)
Hemoglobin: 11.7 g/dL — ABNORMAL LOW (ref 12.0–15.0)
Immature Granulocytes: 0 %
Lymphocytes Relative: 17 %
Lymphs Abs: 0.7 10*3/uL (ref 0.7–4.0)
MCH: 27.8 pg (ref 26.0–34.0)
MCHC: 31.8 g/dL (ref 30.0–36.0)
MCV: 87.4 fL (ref 80.0–100.0)
Monocytes Absolute: 0.1 10*3/uL (ref 0.1–1.0)
Monocytes Relative: 3 %
Neutro Abs: 3.5 10*3/uL (ref 1.7–7.7)
Neutrophils Relative %: 80 %
Platelets: 246 10*3/uL (ref 150–400)
RBC: 4.21 MIL/uL (ref 3.87–5.11)
RDW: 14 % (ref 11.5–15.5)
WBC: 4.3 10*3/uL (ref 4.0–10.5)
nRBC: 0 % (ref 0.0–0.2)

## 2021-10-12 LAB — COMPREHENSIVE METABOLIC PANEL
ALT: 22 U/L (ref 0–44)
AST: 22 U/L (ref 15–41)
Albumin: 3.7 g/dL (ref 3.5–5.0)
Alkaline Phosphatase: 46 U/L (ref 38–126)
Anion gap: 7 (ref 5–15)
BUN: 18 mg/dL (ref 6–20)
CO2: 29 mmol/L (ref 22–32)
Calcium: 9.4 mg/dL (ref 8.9–10.3)
Chloride: 105 mmol/L (ref 98–111)
Creatinine, Ser: 1.23 mg/dL — ABNORMAL HIGH (ref 0.44–1.00)
GFR, Estimated: 52 mL/min — ABNORMAL LOW (ref >60)
Glucose, Bld: 103 mg/dL — ABNORMAL HIGH (ref 70–99)
Potassium: 4.5 mmol/L (ref 3.5–5.1)
Sodium: 141 mmol/L (ref 135–145)
Total Bilirubin: 0.4 mg/dL (ref 0.3–1.2)
Total Protein: 5.6 g/dL — ABNORMAL LOW (ref 6.5–8.1)

## 2021-10-12 LAB — POCT URINE DRUG SCREEN - MANUAL ENTRY (I-SCREEN)
POC Amphetamine UR: NOT DETECTED
POC Buprenorphine (BUP): NOT DETECTED
POC Cocaine UR: POSITIVE — AB
POC Marijuana UR: NOT DETECTED
POC Methadone UR: NOT DETECTED
POC Methamphetamine UR: NOT DETECTED
POC Morphine: POSITIVE — AB
POC Oxazepam (BZO): NOT DETECTED
POC Oxycodone UR: NOT DETECTED
POC Secobarbital (BAR): NOT DETECTED

## 2021-10-12 LAB — RESP PANEL BY RT-PCR (FLU A&B, COVID) ARPGX2
Influenza A by PCR: NEGATIVE
Influenza B by PCR: NEGATIVE
SARS Coronavirus 2 by RT PCR: NEGATIVE

## 2021-10-12 LAB — POCT PREGNANCY, URINE: Preg Test, Ur: NEGATIVE

## 2021-10-12 LAB — ETHANOL: Alcohol, Ethyl (B): <10 mg/dL (ref <10)

## 2021-10-12 LAB — HEMOGLOBIN A1C
Hgb A1c MFr Bld: 5.1 % (ref 4.8–5.6)
Mean Plasma Glucose: 99.67 mg/dL

## 2021-10-12 LAB — PREGNANCY, URINE: Preg Test, Ur: NEGATIVE

## 2021-10-12 LAB — POC SARS CORONAVIRUS 2 AG: SARSCOV2ONAVIRUS 2 AG: NEGATIVE

## 2021-10-12 MED ORDER — LOPERAMIDE HCL 2 MG PO CAPS: 2.0000 mg | ORAL_CAPSULE | ORAL | Status: DC | PRN

## 2021-10-12 MED ORDER — ALUM & MAG HYDROXIDE-SIMETH 200-200-20 MG/5ML PO SUSP: 30.0000 mL | ORAL | Status: DC | PRN

## 2021-10-12 MED ORDER — HYDROXYZINE HCL 25 MG PO TABS: 25.0000 mg | ORAL_TABLET | Freq: Three times a day (TID) | ORAL | Status: DC | PRN

## 2021-10-12 MED ORDER — METHOCARBAMOL 500 MG PO TABS
500.0000 mg | ORAL_TABLET | Freq: Three times a day (TID) | ORAL | Status: DC | PRN
  Administered 2021-10-12 – 2021-10-15 (×5): 500 mg via ORAL
  Filled 2021-10-12 (×5): qty 1

## 2021-10-12 MED ORDER — HYDROXYZINE HCL 25 MG PO TABS
25.0000 mg | ORAL_TABLET | Freq: Four times a day (QID) | ORAL | Status: DC | PRN
  Administered 2021-10-12 – 2021-10-15 (×5): 25 mg via ORAL
  Filled 2021-10-12 (×5): qty 1

## 2021-10-12 MED ORDER — ONDANSETRON 4 MG PO TBDP
4.0000 mg | ORAL_TABLET | Freq: Four times a day (QID) | ORAL | Status: DC | PRN
  Administered 2021-10-12 – 2021-10-15 (×7): 4 mg via ORAL
  Filled 2021-10-12 (×8): qty 1

## 2021-10-12 MED ORDER — DICYCLOMINE HCL 20 MG PO TABS
20.0000 mg | ORAL_TABLET | Freq: Four times a day (QID) | ORAL | Status: DC | PRN
  Administered 2021-10-12 – 2021-10-15 (×5): 20 mg via ORAL
  Filled 2021-10-12 (×5): qty 1

## 2021-10-12 MED ORDER — CLONIDINE HCL 0.1 MG PO TABS: 0.1000 mg | ORAL_TABLET | ORAL | Status: DC

## 2021-10-12 MED ORDER — HYDROCHLOROTHIAZIDE 25 MG PO TABS
25.0000 mg | ORAL_TABLET | Freq: Every day | ORAL | Status: DC
Start: 2021-10-12 — End: 2021-10-13
  Administered 2021-10-12 – 2021-10-13 (×2): 25 mg via ORAL
  Filled 2021-10-12 (×2): qty 1

## 2021-10-12 MED ORDER — CLONIDINE HCL 0.1 MG PO TABS: 0.1000 mg | ORAL_TABLET | Freq: Every day | ORAL | Status: DC

## 2021-10-12 MED ORDER — CLONIDINE HCL 0.1 MG PO TABS
0.1000 mg | ORAL_TABLET | Freq: Four times a day (QID) | ORAL | Status: DC
  Administered 2021-10-12 – 2021-10-13 (×4): 0.1 mg via ORAL
  Filled 2021-10-12 (×4): qty 1

## 2021-10-12 MED ORDER — MAGNESIUM HYDROXIDE 400 MG/5ML PO SUSP: 30.0000 mL | Freq: Every day | ORAL | Status: DC | PRN

## 2021-10-12 MED ORDER — TRAZODONE HCL 50 MG PO TABS
50.0000 mg | ORAL_TABLET | Freq: Every evening | ORAL | Status: DC | PRN
  Administered 2021-10-12 – 2021-10-14 (×3): 50 mg via ORAL
  Filled 2021-10-12 (×3): qty 1

## 2021-10-12 MED ORDER — ACETAMINOPHEN 325 MG PO TABS
650.0000 mg | ORAL_TABLET | Freq: Four times a day (QID) | ORAL | Status: DC | PRN
  Administered 2021-10-14: 650 mg via ORAL
  Filled 2021-10-12: qty 2

## 2021-10-12 NOTE — ED Notes (Signed)
Pt came to nurse and complained of muscle spasm.

## 2021-10-12 NOTE — ED Notes (Signed)
Pt came to nurse and complained of nausea and vomiting.

## 2021-10-12 NOTE — ED Notes (Signed)
Pt was given dinner. 
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
## 2021-10-12 NOTE — ED Notes (Signed)
Patient present to [facility] for substance abuse.Patient A&Ox4. when asked. Denies  SI/HI  and A/VH. Patient complains of nausea and mild body aches. Patient was medicated per MAR orders. No acute distress noted. Support and encouragement provided. Routine safety checks conducted according to facility protocol. Encouraged patient to notify staff if thoughts of harm toward self or others arise. Patient verbalize understanding and agreement. Will continue to monitor for safety.

## 2021-10-12 NOTE — ED Notes (Signed)
Pt. Is complaining of restless leg. I told her to walk up and down the hallway take a hot shower to see if that helps.

## 2021-10-12 NOTE — ED Notes (Signed)
Pt is in the bed resting. Respirations are even an unlabored. No acute distress noted.Will continue to monitor for safety.

## 2021-10-12 NOTE — Progress Notes (Addendum)
[name] is agitated related to her medication regimen. She was given her 1800 dose early, then returned within 30 minutes and requested sleeping medication. The administration time was explained to her and the rationale. She feels she is suffering. It was explained to her we help to relieved most of her symptoms but it will take time for them to completely go away. She ate dinner and returned to her room after making a phone call. She did not attend any groups after her arrival to [facility].

## 2021-10-12 NOTE — ED Notes (Signed)
Patient arrived on unit. Patient was given meal. Patient calm and cooperative. Patient safe on unit wit continued monitoring.

## 2021-10-12 NOTE — Discharge Instructions (Addendum)
Based on your interest, the following providers for substance use rehab/residential are provided below.  It is imperative that you follow-up with treatment no more than 5-7 from the day of discharge to for the sake of your safety and mental health.  In case of an urgent emergency, you have the option of contacting the Mobile Crisis Unit with [facility] at [phone].  Per the conversation with the social worker, you will be following up with [facility] for substance use residential treatment.  Please, remember to take all of the discharge paperwork with you when you do the intake with Marcelino Duster, B. @ [facility].   [facility] [address] [place], [place], [address] Women[facility] Program - [phone] office Ask to speak with the executive director - Lurline Idol The program is free, and they offer free transportation to [place].  No controlled substances, Seroquel, or any other heavy anti-psychotic medications are allowed at the program.  [facility] [address] [place], [place], [address] [phone] office [phone] fax Bring your discharge paperwork from the [facility] with you along with a 30 day supply of any medications you are taking and 1 prescription refill for another 30 days.  [facility] [address] [place], [place], [address] [phone] office [phone] fax

## 2021-10-12 NOTE — Progress Notes (Signed)
Received [name] on the [facility], she was cooperative with the admission process and oriented to her new environment. She made a phone call and returned to her room to rest. She endorsed feeling depressed and anxious prior to her arrival to this facility. She is anxious related to her rehab process.

## 2021-10-12 NOTE — ED Notes (Signed)
Pt. Is rolling around on the bed in pain because of her restless leg.

## 2021-10-12 NOTE — ED Provider Notes (Addendum)
[facility] Continuous Assessment Admission H&P  Date: 10/12/21 Patient Name: [name] MRN: [id number] Chief Complaint:  Chief Complaint  Patient presents with   Addiction Problem      Diagnoses:  Final diagnoses:  Opioid dependence with withdrawal (HCC)  Polysubstance abuse (HCC)    [name] is a 55-year African-American female that presents requesting detox from opiates, heroin and cocaine.  States last used last night.  Reports she has been using for the past.  Denied that she is followed by therapy or psychiatry currently.  Denies that she is ever attended residential treatment facility.  States she spoke to her doctor who advised her to come to the hospital to detox.  States "I am just looking to stay here for 3 days and go back home. "  Patient reported using Suboxon in the past, " I don't like the way I feel on that medication."  She denied suicidal or homicidal ideations.  Denies auditory visual hallucinations.  Denied previous inpatient admissions.  During evaluation [name] is sitting in no acute distress. She is alert/oriented x 4; calm/cooperative; and mood congruent with affect. She is speaking in a clear tone at moderate volume, and normal pace; with good eye contact. Her thought process is coherent and relevant; There is no indication that she is currently responding to internal/external stimuli or experiencing delusional thought content; and she has denied suicidal/self-harm/homicidal ideation, psychosis, and paranoia.  Patient has remained calm throughout assessment and has answered questions appropriately.       PHQ 2-9:  Flowsheet Row Office Visit from 08/04/2016 in [facility]  Thoughts that you would be better off dead, or of hurting yourself in some way Not at all  PHQ-9 Total Score 22         Total Time spent with patient: 15 minutes  Musculoskeletal  Strength & Muscle Tone: within normal limits Gait & Station:  normal Patient leans: N/A  Psychiatric Specialty Exam  Presentation General Appearance: Appropriate for Environment Eye Contact:Good Speech:Clear and Coherent Speech Volume:Normal Handedness:Right  Mood and Affect  Mood:Depressed; Anxious Affect:Congruent  Thought Process  Thought Processes:Coherent Descriptions of Associations:Intact  Orientation:Full (Time, Place and Person)  Thought Content:Logical    Hallucinations:Hallucinations: None  Ideas of Reference:None  Suicidal Thoughts:Suicidal Thoughts: No  Homicidal Thoughts:Homicidal Thoughts: No   Sensorium  Memory:Recent Good; Immediate Good Judgment:Good Insight:Good  Executive Functions  Concentration:Fair Attention Span:Good Recall:Good Fund of Knowledge:Good Language:Good  Psychomotor Activity  Psychomotor Activity:Psychomotor Activity: Normal  Assets  Assets:Desire for Improvement; Social Support  Sleep  Sleep:Sleep: Fair  Nutritional Assessment (For OBS and FBC admissions only) Has the patient had a weight loss or gain of 10 pounds or more in the last 3 months?: No Has the patient had a decrease in food intake/or appetite?: No Does the patient have dental problems?: No Does the patient have eating habits or behaviors that may be indicators of an eating disorder including binging or inducing vomiting?: No Has the patient recently lost weight without trying?: 0 Has the patient been eating poorly because of a decreased appetite?: 0 Malnutrition Screening Tool Score: 0    Physical Exam Vitals and nursing note reviewed.  Cardiovascular:     Rate and Rhythm: Tachycardia present.  Musculoskeletal:     Cervical back: Normal range of motion.  Skin:    General: Skin is warm and dry.  Psychiatric:        Mood and Affect: Mood normal.  Behavior: Behavior normal.        Thought Content: Thought content normal.    ROS  Blood pressure (!) 169/110, pulse 100, temperature 99.1 F (37.3 C),  temperature source Oral, resp. rate 18, SpO2 100 %. There is no height or weight on file to calculate BMI.  Past Psychiatric History:   Is the patient at risk to self? Yes  Has the patient been a risk to self in the past 6 months? Yes .    Has the patient been a risk to self within the distant past? No   Is the patient a risk to others? No   Has the patient been a risk to others in the past 6 months? No   Has the patient been a risk to others within the distant past? No   Past Medical History:  Past Medical History:  Diagnosis Date   Carpal tunnel syndrome    Hypercholesteremia    Hypertension     Past Surgical History:  Procedure Laterality Date   CARPAL TUNNEL RELEASE     CESAREAN SECTION     CHOLECYSTECTOMY      Family History:  Family History  Problem Relation Age of Onset   Hypertension Mother    Hypertension Father    Cancer Maternal Grandfather     Social History:  Social History   Socioeconomic History   Marital status: Married    Spouse name: Not on file   Number of children: Not on file   Years of education: Not on file   Highest education level: Not on file  Occupational History   Not on file  Tobacco Use   Smoking status: Every Day    Packs/day: 0.30    Types: Cigarettes   Smokeless tobacco: Never  Substance and Sexual Activity   Alcohol use: No   Drug use: Yes    Comment: Heroin use, last use 08/29/2018   Sexual activity: Not Currently    Partners: Male    Birth control/protection: None    Comment: abstaining due to comfort  Other Topics Concern   Not on file  Social History Narrative   Not on file   Social Determinants of Health   Financial Resource Strain: Not on file  Food Insecurity: Not on file  Transportation Needs: Not on file  Physical Activity: Not on file  Stress: Not on file  Social Connections: Not on file  Intimate Partner Violence: Not on file    SDOH:  SDOH Screenings   Alcohol Screen: Not on file  Depression  (GYF7-4): Not on file  Financial Resource Strain: Not on file  Food Insecurity: Not on file  Housing: Not on file  Physical Activity: Not on file  Social Connections: Not on file  Stress: Not on file  Tobacco Use: High Risk (08/30/2018)   Patient History    Smoking Tobacco Use: Every Day    Smokeless Tobacco Use: Never    Passive Exposure: Not on file  Transportation Needs: Not on file    Last Labs:  No visits with results within 6 Month(s) from this visit.  Latest known visit with results is:  Admission on 08/30/2018, Discharged on 08/30/2018  Component Date Value Ref Range Status   Sodium 08/30/2018 137  135 - 145 mmol/L Final   Potassium 08/30/2018 3.4 (L)  3.5 - 5.1 mmol/L Final   Chloride 08/30/2018 98  98 - 111 mmol/L Final   CO2 08/30/2018 21 (L)  22 - 32 mmol/L Final  Glucose, Bld 08/30/2018 99  70 - 99 mg/dL Final   BUN 64/33/2951 6  6 - 20 mg/dL Final   Creatinine, Ser 08/30/2018 0.90  0.44 - 1.00 mg/dL Final   Calcium 88/41/6606 9.5  8.9 - 10.3 mg/dL Final   Total Protein 30/16/0109 7.5  6.5 - 8.1 g/dL Final   Albumin 32/35/5732 3.9  3.5 - 5.0 g/dL Final   AST 20/25/4270 25  15 - 41 U/L Final   ALT 08/30/2018 23  0 - 44 U/L Final   Alkaline Phosphatase 08/30/2018 71  38 - 126 U/L Final   Total Bilirubin 08/30/2018 0.7  0.3 - 1.2 mg/dL Final   GFR calc non Af Amer 08/30/2018 >60  >60 mL/min Final   GFR calc Af Amer 08/30/2018 >60  >60 mL/min Final   Anion gap 08/30/2018 18 (H)  5 - 15 Final   Performed at [facility], [address] [place], [place] [address]   Alcohol, Ethyl (B) 08/30/2018 <10  <10 mg/dL Final   Comment: (NOTE) Lowest detectable limit for serum alcohol is 10 mg/dL. For medical purposes only. Performed at [facility], [address] [place], [place] [address]    Opiates 08/30/2018 POSITIVE (A)  NONE DETECTED Final   Cocaine 08/30/2018 POSITIVE (A)  NONE DETECTED Final   Benzodiazepines 08/30/2018 NONE DETECTED  NONE DETECTED  Final   Amphetamines 08/30/2018 NONE DETECTED  NONE DETECTED Final   Tetrahydrocannabinol 08/30/2018 POSITIVE (A)  NONE DETECTED Final   Barbiturates 08/30/2018 NONE DETECTED  NONE DETECTED Final   Comment: (NOTE) DRUG SCREEN FOR MEDICAL PURPOSES ONLY.  IF CONFIRMATION IS NEEDED FOR ANY PURPOSE, NOTIFY LAB WITHIN 5 DAYS. LOWEST DETECTABLE LIMITS FOR URINE DRUG SCREEN Drug Class                     Cutoff (ng/mL) Amphetamine and metabolites    1000 Barbiturate and metabolites    200 Benzodiazepine                 200 Tricyclics and metabolites     300 Opiates and metabolites        300 Cocaine and metabolites        300 THC                            50 Performed at [facility], [address] [place], [place] [address]    WBC 08/30/2018 7.2  4.0 - 10.5 K/uL Final   RBC 08/30/2018 4.54  3.87 - 5.11 MIL/uL Final   Hemoglobin 08/30/2018 12.6  12.0 - 15.0 g/dL Final   HCT 07/37/1062 38.0  36.0 - 46.0 % Final   MCV 08/30/2018 83.7  80.0 - 100.0 fL Final   MCH 08/30/2018 27.8  26.0 - 34.0 pg Final   MCHC 08/30/2018 33.2  30.0 - 36.0 g/dL Final   RDW 69/48/5462 13.2  11.5 - 15.5 % Final   Platelets 08/30/2018 350  150 - 400 K/uL Final   nRBC 08/30/2018 0.0  0.0 - 0.2 % Final   Neutrophils Relative % 08/30/2018 77  % Final   Neutro Abs 08/30/2018 5.5  1.7 - 7.7 K/uL Final   Lymphocytes Relative 08/30/2018 18  % Final   Lymphs Abs 08/30/2018 1.3  0.7 - 4.0 K/uL Final   Monocytes Relative 08/30/2018 5  % Final   Monocytes Absolute 08/30/2018 0.4  0.1 - 1.0 K/uL  Final   Eosinophils Relative 08/30/2018 0  % Final   Eosinophils Absolute 08/30/2018 0.0  0.0 - 0.5 K/uL Final   Basophils Relative 08/30/2018 0  % Final   Basophils Absolute 08/30/2018 0.0  0.0 - 0.1 K/uL Final   Immature Granulocytes 08/30/2018 0  % Final   Abs Immature Granulocytes 08/30/2018 0.02  0.00 - 0.07 K/uL Final   Performed at [facility], [address] [place], [place] [address]   Salicylate Lvl  08/30/2018 <7.0  2.8 - 30.0 mg/dL Final   Performed at [facility], [address], [place], [place] [address]   Acetaminophen (Tylenol), Serum 08/30/2018 <10 (L)  10 - 30 ug/mL Final   Comment: (NOTE) Therapeutic concentrations vary significantly. A range of 10-30 ug/mL  may be an effective concentration for many patients. However, some  are best treated at concentrations outside of this range. Acetaminophen concentrations >150 ug/mL at 4 hours after ingestion  and >50 ug/mL at 12 hours after ingestion are often associated with  toxic reactions. Performed at [facility], [address] [place], [place] [address]    Color, Urine 08/30/2018 YELLOW  YELLOW Final   APPearance 08/30/2018 HAZY (A)  CLEAR Final   Specific Gravity, Urine 08/30/2018 1.008  1.005 - 1.030 Final   pH 08/30/2018 8.0  5.0 - 8.0 Final   Glucose, UA 08/30/2018 NEGATIVE  NEGATIVE mg/dL Final   Hgb urine dipstick 08/30/2018 NEGATIVE  NEGATIVE Final   Bilirubin Urine 08/30/2018 NEGATIVE  NEGATIVE Final   Ketones, ur 08/30/2018 20 (A)  NEGATIVE mg/dL Final   Protein, ur 60/01/9322 NEGATIVE  NEGATIVE mg/dL Final   Nitrite 55/73/2202 NEGATIVE  NEGATIVE Final   Leukocytes,Ua 08/30/2018 NEGATIVE  NEGATIVE Final   Performed at [facility], [address] [place], [place] [address]    Allergies: Ibuprofen, Benadryl [diphenhydramine], and Sulfa antibiotics  PTA Medications: (Not in a hospital admission)   Medical Decision Making      Recommendations  Based on my evaluation the patient appears to have an emergency medical condition for which I recommend the patient be transferred to the emergency department for further evaluation.  Oneta Rack, NP 10/12/21  12:18 PM

## 2021-10-12 NOTE — ED Triage Notes (Signed)
Pt presents to [facility] seeking detox from heroine and cocaine. Pt states she last used an unknown amount of cocaine yesterday. Pt reports withdrawal symptoms; nausea and shaking. Pt denies SI/HI and AVH.

## 2021-10-13 DIAGNOSIS — Z20822 Contact with and (suspected) exposure to covid-19: Secondary | ICD-10-CM | POA: Diagnosis not present

## 2021-10-13 DIAGNOSIS — F1420 Cocaine dependence, uncomplicated: Secondary | ICD-10-CM | POA: Diagnosis not present

## 2021-10-13 DIAGNOSIS — F17210 Nicotine dependence, cigarettes, uncomplicated: Secondary | ICD-10-CM | POA: Diagnosis not present

## 2021-10-13 DIAGNOSIS — F1123 Opioid dependence with withdrawal: Secondary | ICD-10-CM | POA: Diagnosis not present

## 2021-10-13 LAB — GC/CHLAMYDIA PROBE AMP (~~LOC~~) NOT AT ARMC
Chlamydia: NEGATIVE
Comment: NEGATIVE
Comment: NORMAL
Neisseria Gonorrhea: NEGATIVE

## 2021-10-13 MED ORDER — CLONIDINE HCL 0.1 MG PO TABS
0.1000 mg | ORAL_TABLET | Freq: Every day | ORAL | Status: DC
  Administered 2021-10-14 – 2021-10-15 (×2): 0.1 mg via ORAL
  Filled 2021-10-13 (×3): qty 1

## 2021-10-13 MED ORDER — CLONIDINE HCL 0.1 MG PO TABS
0.1000 mg | ORAL_TABLET | Freq: Once | ORAL | Status: AC
  Administered 2021-10-13: 0.1 mg via ORAL
  Filled 2021-10-13: qty 1

## 2021-10-13 MED ORDER — ASPIRIN 81 MG PO TBEC
81.0000 mg | DELAYED_RELEASE_TABLET | Freq: Every day | ORAL | Status: DC
  Administered 2021-10-13 – 2021-10-15 (×3): 81 mg via ORAL
  Filled 2021-10-13 (×3): qty 1

## 2021-10-13 MED ORDER — LISINOPRIL 10 MG PO TABS
10.0000 mg | ORAL_TABLET | Freq: Every day | ORAL | Status: DC
  Administered 2021-10-13 – 2021-10-15 (×3): 10 mg via ORAL
  Filled 2021-10-13 (×3): qty 1
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
## 2021-10-13 NOTE — ED Notes (Signed)
Pt resting in no acute distress. RR even and unlabored. Safety maintained. 

## 2021-10-13 NOTE — ED Notes (Signed)
Pt is in the bed sleeping. Respirations are even and unlabored. No acute distress noted. Will continue to monitor for safety. 

## 2021-10-13 NOTE — ED Notes (Signed)
Pt is in the bed awake. Respirations are even and unlabored. No acute distress noted. Will continue to monitor for safety. 

## 2021-10-13 NOTE — ED Notes (Signed)
Pt complaining of restlessness, anxiety, pt unable to sleep. BP is elevated. Provider notified.

## 2021-10-13 NOTE — ED Notes (Signed)
Pt complained of nausea and increased anxiety. Pt received Zofran and Vistaril without difficulty. Denied any other discomfort or needs. Informed pt to notify staff with any assistance. Verbalized understanding. Will continue to monitor for safety.

## 2021-10-13 NOTE — ED Notes (Signed)
Patient A&Ox4. Denies intent to harm self/others when asked. Denies A/VH. Patient denies any physical complaints when asked. No acute distress noted. Support and encouragement provided. Routine safety checks conducted according to facility protocol. Encouraged patient to notify staff if thoughts of harm toward self or others arise. Patient verbalize understanding and agreement. Will continue to monitor for safety.    

## 2021-10-13 NOTE — Group Note (Signed)
Group Topic: Communication  Group Date: 10/13/2021 Start Time: 0800 End Time: 0830 Facilitators: Emmit Pomfret D, NT  Department: [facility]  Number of Participants: 3  Group Focus: check in, co-dependency, communication, coping skills, and daily focus Treatment Modality:  Psychoeducation Interventions utilized were leisure development, problem solving, and support Purpose: enhance coping skills, express feelings, increase insight, regain self-worth, and reinforce self-care  Name: [name] Date of Birth: 1965/10/02  MR: [id number]    Level of Participation: moderate Quality of Participation: attentive Interactions with others: gave feedback Mood/Affect: appropriate Triggers (if applicable): n/a Cognition: insightful Progress: Moderate Response: n/a Plan: follow-up needed  Patients Problems:  Patient Active Problem List   Diagnosis Date Noted   Polysubstance (including opioids) dependence w/o physiol dependence (HCC) 10/12/2021

## 2021-10-13 NOTE — ED Notes (Signed)
Patient resting quietly in bed with eyes closed, Respirations equal and unlabored, skin warm and dry, NAD. No change in assessment or acuity. Q 15 minute safety checks remain in place.   

## 2021-10-13 NOTE — ED Notes (Signed)
Pt. Is tossing and turning in the bed upon my rounds.

## 2021-10-13 NOTE — BH Assessment (Signed)
LCSW Progress Note  LCSW spoke with pt about resources for substance use residential treatment.  Pt expressed that she had spoken with the intake personnel at [facility], [phone], and was instructed to come to detox at the [facility] first before she would be accepted into the residential treatment center.  Resources for other SUD residential centers were provided in case this one did not work out.  Hansel Starling, MSW, LCSW [facility] [phone] phone

## 2021-10-13 NOTE — ED Notes (Signed)
Pt came to nurse and requested for trazodone for sleep.

## 2021-10-13 NOTE — ED Notes (Signed)
Pt. Is back up complaining of restless leg again.

## 2021-10-13 NOTE — ED Notes (Signed)
Patient is resting comfortably. 

## 2021-10-13 NOTE — ED Notes (Signed)
Adela Lank, NP informed of elevated BP 163/104 P 80. New orders received and read back for Clonidine 0.1 mg x 1 dose. Patient also complains of nausea and Zofran -ODT 4 mg given. Patient given Gatorade to drink. Respirations equal and unlabored, skin warm and dry. NAD. Q 15 min safety checks remain in place.Will continue to monitor patient.Marland Kitchen

## 2021-10-13 NOTE — ED Notes (Signed)
Karan has eaten breakfast.

## 2021-10-13 NOTE — ED Notes (Signed)
Pt sleeping in no acute distress. RR even and unlabored. Safety maintained. 

## 2021-10-14 MED ORDER — ONDANSETRON HCL 4 MG/2ML IJ SOLN
4.0000 mg | Freq: Once | INTRAMUSCULAR | Status: DC
  Filled 2021-10-14: qty 2

## 2021-10-14 MED ORDER — CLONIDINE HCL 0.1 MG PO TABS
0.1000 mg | ORAL_TABLET | Freq: Once | ORAL | Status: AC
  Administered 2021-10-14: 0.1 mg via ORAL

## 2021-10-14 MED ORDER — ONDANSETRON HCL 4 MG/2ML IJ SOLN
4.0000 mg | INTRAMUSCULAR | Status: AC
Start: 2021-10-14 — End: 2021-10-14
  Administered 2021-10-14: 4 mg via INTRAMUSCULAR

## 2021-10-14 NOTE — ED Notes (Signed)
Patient asking about her BP - BP taken

## 2021-10-14 NOTE — Progress Notes (Signed)
Patient was given 4mg  zofran PO PRN for nausea.  Shortly afterwards she began actively vomiting observed by RN.  Provider carolyn made aware and additional 4mg  IM zofran ordered and given.  Patient also given gatorade and encouraged to take small sips.  Will monitor and manage withdrawal symptoms as they arise.

## 2021-10-14 NOTE — ED Notes (Signed)
Provider made aware of BP.

## 2021-10-14 NOTE — Progress Notes (Signed)
Patient remains in bed no longer vomitting.  She has been given gatorade and is comfortable at present will continue to monitor and address withdrawal symptoms.

## 2021-10-14 NOTE — ED Notes (Signed)
[facility] LCSW Note  10/14/2021   1:50 PM  Type of Contact and Topic:  [facility] Referral  Pt referred to [facility]. Accepted for intake assessments pending reduction of withdrawal syx. Earliest available admission will be Monday, per [facility] Admissions/Intake, [phone].    Leisa Lenz, LCSW 10/14/2021  1:50 PM

## 2021-10-14 NOTE — ED Notes (Signed)
Pt is in the bed sleeping. Respirations are even and unlabored. No acute distress noted. Will continue to monitor for safety. 

## 2021-10-14 NOTE — ED Notes (Signed)
Patient remains in bed resting.  She is without complaint or distress.  No evidence of withdrawal.  Offered breakfast however she refused.  Denies avh shi or plan will monitor and provide support as needed.

## 2021-10-14 NOTE — ED Notes (Signed)
Patient continues to rest with no sxs of distress noted - will continue to monitor for safety 

## 2021-10-14 NOTE — ED Notes (Signed)
Pt. Has been coming out asking about her blood pressure meds. Told her we are still waiting on the doctor. MHT took blood pressure at 7:30pm RN asked doctor for meds. Doc asked for manual BP at 9PM RN took BP and we are still waiting for the meds to be approved.

## 2021-10-14 NOTE — ED Provider Notes (Signed)
Behavioral Health Progress Note  Date and Time: 10/14/2021 12:17 PM Name: [name] MRN:  [id number]  Subjective:  [name] 56 y.o., female patient who initially presented to [facility] on 10/12/2021 requesting detox.  She was admitted to the [facility] for detox and crisis stabilization.  [name], 56 y.o., female patient seen face to face by this provider, consulted with Dr. Lucianne Muss; and chart reviewed on 10/14/21.  Patient has a reported history of depression and polysubstance abuse (cocaine and opiates).  Upon admission UDS is positive for cocaine and morphine.  BAL was negative.  On today's assessment [name] is laying in her bed.  She immediately reports that she is feeling nauseous, as needed Zofran was previously administered.  She is alert/oriented x4 and cooperative.  She is anxious and slightly irritable.  She continues to endorse depression with feelings of helplessness, hopelessness, guilt, decreased energy and focus.  She endorses decreased sleep and appetite.  She denies SI/HI/AVH.  She endorses withdrawal symptoms that include: Diarrhea, stomach pain, runny nose, body aches, and watery eyes. Discussed PRN medications for comfort care.  She is interested in residential treatment.  Social work notified. COWS score has remained 7 and below over the past 24 hours.     Diagnosis:  Final diagnoses:  Opioid dependence with withdrawal (HCC)  Polysubstance abuse (HCC)    Total Time spent with patient: 30 minutes  Past Psychiatric History: See H&P Past Medical History:  Past Medical History:  Diagnosis Date   Carpal tunnel syndrome    Hypercholesteremia    Hypertension     Past Surgical History:  Procedure Laterality Date   CARPAL TUNNEL RELEASE     CESAREAN SECTION     CHOLECYSTECTOMY     Family History:  Family History  Problem Relation Age of Onset   Hypertension Mother    Hypertension Father    Cancer Maternal Grandfather    Family Psychiatric  History: See  H&P Social History:  Social History   Substance and Sexual Activity  Alcohol Use No     Social History   Substance and Sexual Activity  Drug Use Yes   Comment: Heroin use, last use 08/29/2018    Social History   Socioeconomic History   Marital status: Married    Spouse name: Not on file   Number of children: Not on file   Years of education: Not on file   Highest education level: Not on file  Occupational History   Not on file  Tobacco Use   Smoking status: Every Day    Packs/day: 0.30    Types: Cigarettes   Smokeless tobacco: Never  Substance and Sexual Activity   Alcohol use: No   Drug use: Yes    Comment: Heroin use, last use 08/29/2018   Sexual activity: Not Currently    Partners: Male    Birth control/protection: None    Comment: abstaining due to comfort  Other Topics Concern   Not on file  Social History Narrative   Not on file   Social Determinants of Health   Financial Resource Strain: Not on file  Food Insecurity: Not on file  Transportation Needs: Not on file  Physical Activity: Not on file  Stress: Not on file  Social Connections: Not on file   SDOH:  SDOH Screenings   Alcohol Screen: Not on file  Depression (PHQ2-9): Medium Risk (10/13/2021)   Depression (PHQ2-9)    PHQ-2 Score: 27  Financial Resource Strain: Not on file  Food Insecurity: Not on file  Housing: Not on file  Physical Activity: Not on file  Social Connections: Not on file  Stress: Not on file  Tobacco Use: Not on file  Transportation Needs: Not on file   Additional Social History:    Pain Medications: See MRA Prescriptions: See MRA Over the Counter: See MRa History of alcohol / drug use?: Yes Longest period of sobriety (when/how long): n/a Negative Consequences of Use: Personal relationships, Financial Withdrawal Symptoms:  (UTA) Name of Substance 1: Heroin 1 - Age of First Use: 54 1 - Amount (size/oz): UTA 1 - Frequency: daily 1 - Duration: ongoing 1 - Last Use /  Amount: 10/11/21 1 - Method of Aquiring: UTA 1- Route of Use: snorted Name of Substance 2: Crack Cocaine 2 - Age of First Use: 54 2 - Amount (size/oz): UTA 2 - Frequency: daily 2 - Duration: ongoing 2 - Last Use / Amount: 10/12/21 2 - Method of Aquiring: UTA 2 - Route of Substance Use: snort                Sleep: Fair  Appetite:  Fair  Current Medications:  Current Facility-Administered Medications  Medication Dose Route Frequency Provider Last Rate Last Admin   acetaminophen (TYLENOL) tablet 650 mg  650 mg Oral Q6H PRN Oneta Rack, NP   650 mg at 10/14/21 0920   alum & mag hydroxide-simeth (MAALOX/MYLANTA) 200-200-20 MG/5ML suspension 30 mL  30 mL Oral Q4H PRN Oneta Rack, NP       aspirin EC tablet 81 mg  81 mg Oral Daily Estella Husk, MD   81 mg at 10/14/21 0920   cloNIDine (CATAPRES) tablet 0.1 mg  0.1 mg Oral Daily Estella Husk, MD   0.1 mg at 10/14/21 0920   dicyclomine (BENTYL) tablet 20 mg  20 mg Oral Q6H PRN Oneta Rack, NP   20 mg at 10/13/21 1712   hydrOXYzine (ATARAX) tablet 25 mg  25 mg Oral Q6H PRN Oneta Rack, NP   25 mg at 10/13/21 1800   lisinopril (ZESTRIL) tablet 10 mg  10 mg Oral Daily Estella Husk, MD   10 mg at 10/14/21 0920   loperamide (IMODIUM) capsule 2-4 mg  2-4 mg Oral PRN Oneta Rack, NP       magnesium hydroxide (MILK OF MAGNESIA) suspension 30 mL  30 mL Oral Daily PRN Oneta Rack, NP       methocarbamol (ROBAXIN) tablet 500 mg  500 mg Oral Q8H PRN Oneta Rack, NP   500 mg at 10/13/21 1712   ondansetron (ZOFRAN-ODT) disintegrating tablet 4 mg  4 mg Oral Q6H PRN Oneta Rack, NP   4 mg at 10/14/21 0920   traZODone (DESYREL) tablet 50 mg  50 mg Oral QHS PRN Oneta Rack, NP   50 mg at 10/13/21 2308   Current Outpatient Medications  Medication Sig Dispense Refill   aspirin EC 81 MG tablet Take 81 mg by mouth daily. Swallow whole.     cloNIDine (CATAPRES) 0.1 MG tablet Take 0.1 mg by mouth  daily.     Cyanocobalamin (VITAMIN B-12 PO) Take 1 tablet by mouth daily.     Ferrous Sulfate (IRON PO) Take 1 tablet by mouth daily.     lisinopril (ZESTRIL) 10 MG tablet Take 10 mg by mouth daily.     Multiple Vitamin (MULTIVITAMIN WITH MINERALS) TABS tablet Take 1 tablet by mouth daily.  Naproxen Sod-diphenhydrAMINE (ALEVE PM) 220-25 MG TABS Take 2 tablets by mouth at bedtime as needed (For sleep or pain).      Labs  Lab Results:  Admission on 10/12/2021  Component Date Value Ref Range Status   SARS Coronavirus 2 by RT PCR 10/12/2021 NEGATIVE  NEGATIVE Final   Comment: (NOTE) SARS-CoV-2 target nucleic acids are NOT DETECTED.  The SARS-CoV-2 RNA is generally detectable in upper respiratory specimens during the acute phase of infection. The lowest concentration of SARS-CoV-2 viral copies this assay can detect is 138 copies/mL. A negative result does not preclude SARS-Cov-2 infection and should not be used as the sole basis for treatment or other patient management decisions. A negative result may occur with  improper specimen collection/handling, submission of specimen other than nasopharyngeal swab, presence of viral mutation(s) within the areas targeted by this assay, and inadequate number of viral copies(<138 copies/mL). A negative result must be combined with clinical observations, patient history, and epidemiological information. The expected result is Negative.  Fact Sheet for Patients:  BloggerCourse.com  Fact Sheet for Healthcare Providers:  SeriousBroker.it  This test is no                          t yet approved or cleared by the [place] FDA and  has been authorized for detection and/or diagnosis of SARS-CoV-2 by FDA under an Emergency Use Authorization (EUA). This EUA will remain  in effect (meaning this test can be used) for the duration of the COVID-19 declaration under Section 564(b)(1) of the Act,  21 U.S.C.section 360bbb-3(b)(1), unless the authorization is terminated  or revoked sooner.       Influenza A by PCR 10/12/2021 NEGATIVE  NEGATIVE Final   Influenza B by PCR 10/12/2021 NEGATIVE  NEGATIVE Final   Comment: (NOTE) The Xpert Xpress SARS-CoV-2/FLU/RSV plus assay is intended as an aid in the diagnosis of influenza from Nasopharyngeal swab specimens and should not be used as a sole basis for treatment. Nasal washings and aspirates are unacceptable for Xpert Xpress SARS-CoV-2/FLU/RSV testing.  Fact Sheet for Patients: BloggerCourse.com  Fact Sheet for Healthcare Providers: SeriousBroker.it  This test is not yet approved or cleared by the [place] FDA and has been authorized for detection and/or diagnosis of SARS-CoV-2 by FDA under an Emergency Use Authorization (EUA). This EUA will remain in effect (meaning this test can be used) for the duration of the COVID-19 declaration under Section 564(b)(1) of the Act, 21 U.S.C. section 360bbb-3(b)(1), unless the authorization is terminated or revoked.  Performed at [facility], [address], [place], [place] [address]    WBC 10/12/2021 4.3  4.0 - 10.5 K/uL Final   RBC 10/12/2021 4.21  3.87 - 5.11 MIL/uL Final   Hemoglobin 10/12/2021 11.7 (L)  12.0 - 15.0 g/dL Final   HCT 77/82/4235 36.8  36.0 - 46.0 % Final   MCV 10/12/2021 87.4  80.0 - 100.0 fL Final   MCH 10/12/2021 27.8  26.0 - 34.0 pg Final   MCHC 10/12/2021 31.8  30.0 - 36.0 g/dL Final   RDW 36/14/4315 14.0  11.5 - 15.5 % Final   Platelets 10/12/2021 246  150 - 400 K/uL Final   nRBC 10/12/2021 0.0  0.0 - 0.2 % Final   Neutrophils Relative % 10/12/2021 80  % Final   Neutro Abs 10/12/2021 3.5  1.7 - 7.7 K/uL Final   Lymphocytes Relative 10/12/2021 17  % Final   Lymphs Abs  10/12/2021 0.7  0.7 - 4.0 K/uL Final   Monocytes Relative 10/12/2021 3  % Final   Monocytes Absolute 10/12/2021 0.1  0.1 - 1.0 K/uL  Final   Eosinophils Relative 10/12/2021 0  % Final   Eosinophils Absolute 10/12/2021 0.0  0.0 - 0.5 K/uL Final   Basophils Relative 10/12/2021 0  % Final   Basophils Absolute 10/12/2021 0.0  0.0 - 0.1 K/uL Final   Immature Granulocytes 10/12/2021 0  % Final   Abs Immature Granulocytes 10/12/2021 0.01  0.00 - 0.07 K/uL Final   Performed at [facility], [address], [place], [place] [address]   Sodium 10/12/2021 141  135 - 145 mmol/L Final   Potassium 10/12/2021 4.5  3.5 - 5.1 mmol/L Final   Chloride 10/12/2021 105  98 - 111 mmol/L Final   CO2 10/12/2021 29  22 - 32 mmol/L Final   Glucose, Bld 10/12/2021 103 (H)  70 - 99 mg/dL Final   Glucose reference range applies only to samples taken after fasting for at least 8 hours.   BUN 10/12/2021 18  6 - 20 mg/dL Final   Creatinine, Ser 10/12/2021 1.23 (H)  0.44 - 1.00 mg/dL Final   Calcium 14/43/1540 9.4  8.9 - 10.3 mg/dL Final   Total Protein 08/67/6195 5.6 (L)  6.5 - 8.1 g/dL Final   Albumin 09/32/6712 3.7  3.5 - 5.0 g/dL Final   AST 45/80/9983 22  15 - 41 U/L Final   ALT 10/12/2021 22  0 - 44 U/L Final   Alkaline Phosphatase 10/12/2021 46  38 - 126 U/L Final   Total Bilirubin 10/12/2021 0.4  0.3 - 1.2 mg/dL Final   GFR, Estimated 10/12/2021 52 (L)  >60 mL/min Final   Comment: (NOTE) Calculated using the CKD-EPI Creatinine Equation (2021)    Anion gap 10/12/2021 7  5 - 15 Final   Performed at [facility], [address], [place], [place] [address]   Hgb A1c MFr Bld 10/12/2021 5.1  4.8 - 5.6 % Final   Comment: (NOTE) Pre diabetes:          5.7%-6.4%  Diabetes:              >6.4%  Glycemic control for   <7.0% adults with diabetes    Mean Plasma Glucose 10/12/2021 99.67  mg/dL Final   Performed at [facility], [address] [place], [place] [address]   Alcohol, Ethyl (B) 10/12/2021 <10  <10 mg/dL Final   Comment: (NOTE) Lowest detectable limit for serum alcohol is 10 mg/dL.  For medical purposes  only. Performed at [facility], [address], [place], [place] [address]    Neisseria Gonorrhea 10/12/2021 Negative   Final   Chlamydia 10/12/2021 Negative   Final   Comment 10/12/2021 Normal Reference Ranger Chlamydia - Negative   Final   Comment 10/12/2021 Normal Reference Range Neisseria Gonorrhea - Negative   Final   POC Amphetamine UR 10/12/2021 None Detected  NONE DETECTED (Cut Off Level 1000 ng/mL) Final   POC Secobarbital (BAR) 10/12/2021 None Detected  NONE DETECTED (Cut Off Level 300 ng/mL) Final   POC Buprenorphine (BUP) 10/12/2021 None Detected  NONE DETECTED (Cut Off Level 10 ng/mL) Final   POC Oxazepam (BZO) 10/12/2021 None Detected  NONE DETECTED (Cut Off Level 300 ng/mL) Final   POC Cocaine UR 10/12/2021 Positive (A)  NONE DETECTED (Cut Off Level 300 ng/mL) Final   POC Methamphetamine UR 10/12/2021 None Detected  NONE DETECTED (Cut Off Level 1000 ng/mL) Final   POC Morphine 10/12/2021 Positive (A)  NONE DETECTED (Cut Off Level 300 ng/mL) Final   POC Methadone UR 10/12/2021 None Detected  NONE DETECTED (Cut Off Level 300 ng/mL) Final   POC Oxycodone UR 10/12/2021 None Detected  NONE DETECTED (Cut Off Level 100 ng/mL) Final   POC Marijuana UR 10/12/2021 None Detected  NONE DETECTED (Cut Off Level 50 ng/mL) Final   Preg Test, Ur 10/12/2021 NEGATIVE  NEGATIVE Final   Comment:        THE SENSITIVITY OF THIS METHODOLOGY IS >20 mIU/mL. Performed at [facility], [address], [place], [place] [address]    SARSCOV2ONAVIRUS 2 AG 10/12/2021 NEGATIVE  NEGATIVE Final   Comment: (NOTE) SARS-CoV-2 antigen NOT DETECTED.   Negative results are presumptive.  Negative results do not preclude SARS-CoV-2 infection and should not be used as the sole basis for treatment or other patient management decisions, including infection  control decisions, particularly in the presence of clinical signs and  symptoms consistent with COVID-19, or in those who have been in contact  with the virus.  Negative results must be combined with clinical observations, patient history, and epidemiological information. The expected result is Negative.  Fact Sheet for Patients: https://www.jennings-kim.com/  Fact Sheet for Healthcare Providers: https://alexander-rogers.biz/  This test is not yet approved or cleared by the [place] FDA and  has been authorized for detection and/or diagnosis of SARS-CoV-2 by FDA under an Emergency Use Authorization (EUA).  This EUA will remain in effect (meaning this test can be used) for the duration of  the COV                          ID-19 declaration under Section 564(b)(1) of the Act, 21 U.S.C. section 360bbb-3(b)(1), unless the authorization is terminated or revoked sooner.     Preg Test, Ur 10/12/2021 NEGATIVE  NEGATIVE Final   Comment:        THE SENSITIVITY OF THIS METHODOLOGY IS >24 mIU/mL     Blood Alcohol level:  Lab Results  Component Value Date   ETH <10 10/12/2021   ETH <10 08/30/2018    Metabolic Disorder Labs: Lab Results  Component Value Date   HGBA1C 5.1 10/12/2021   MPG 99.67 10/12/2021   No results found for: "PROLACTIN" No results found for: "CHOL", "TRIG", "HDL", "CHOLHDL", "VLDL", "LDLCALC"  Therapeutic Lab Levels: No results found for: "LITHIUM" No results found for: "VALPROATE" No results found for: "CBMZ"  Physical Findings   GAD-7    Flowsheet Row Office Visit from 08/04/2016 in [facility]  Total GAD-7 Score 21      PHQ2-9    Flowsheet Row ED from 10/12/2021 in [facility] Office Visit from 08/04/2016 in [facility]  PHQ-2 Total Score 6 5  PHQ-9 Total Score 27 22      Flowsheet Row ED from 10/12/2021 in [facility]  C-SSRS RISK CATEGORY No Risk        Musculoskeletal  Strength & Muscle Tone: within normal limits Gait & Station: normal Patient  leans: N/A  Psychiatric Specialty Exam  Presentation  General Appearance: Appropriate for Environment; Casual  Eye Contact:Minimal  Speech:Clear and Coherent; Normal Rate  Speech Volume:Decreased  Handedness:Right   Mood and Affect  Mood:Depressed  Affect:Congruent   Thought Process  Thought Processes:Coherent  Descriptions of Associations:Intact  Orientation:Full (  Time, Place and Person)  Thought Content:Logical  Diagnosis of Schizophrenia or Schizoaffective disorder in past: No    Hallucinations:Hallucinations: None  Ideas of Reference:None  Suicidal Thoughts:Suicidal Thoughts: No  Homicidal Thoughts:Homicidal Thoughts: No   Sensorium  Memory:Immediate Fair; Recent Fair; Remote Fair  Judgment:Fair  Insight:Fair   Executive Functions  Concentration:Fair  Attention Span:Fair  Recall:Fair  Fund of Knowledge:Good  Language:Good   Psychomotor Activity  Psychomotor Activity:Psychomotor Activity: Normal   Assets  Assets:Communication Skills; Desire for Improvement; Resilience; Physical Health; Leisure Time   Sleep  Sleep:Sleep: Fair   No data recorded  Physical Exam  Physical Exam Vitals and nursing note reviewed.  Constitutional:      General: She is not in acute distress.    Appearance: Normal appearance. She is not ill-appearing.  HENT:     Head: Normocephalic.  Eyes:     General:        Right eye: No discharge.        Left eye: No discharge.     Conjunctiva/sclera: Conjunctivae normal.  Cardiovascular:     Rate and Rhythm: Normal rate.  Pulmonary:     Effort: Pulmonary effort is normal.  Musculoskeletal:        General: Normal range of motion.     Cervical back: Normal range of motion.  Skin:    Coloration: Skin is not jaundiced or pale.  Neurological:     Mental Status: She is alert and oriented to person, place, and time.  Psychiatric:        Attention and Perception: Attention and perception normal.        Mood and  Affect: Affect normal. Mood is anxious and depressed.        Speech: Speech normal.        Behavior: Behavior normal. Behavior is cooperative.        Thought Content: Thought content normal.        Cognition and Memory: Cognition normal.        Judgment: Judgment normal.    Review of Systems  Constitutional: Negative.   HENT: Negative.    Eyes: Negative.   Respiratory: Negative.    Cardiovascular: Negative.   Musculoskeletal: Negative.   Skin: Negative.   Neurological: Negative.   Psychiatric/Behavioral:  Positive for depression and substance abuse. The patient is nervous/anxious.    Blood pressure (!) 151/98, pulse 84, temperature 97.9 F (36.6 C), temperature source Oral, resp. rate 16, SpO2 100 %. There is no height or weight on file to calculate BMI.  Treatment Plan Summary:  Disposition: ongoing, Social work notified that patient is requesting residential substance abuse treatment.  We will continue to have daily contact with patient to assess and evaluate symptoms and progress in treatment and Medication management  Ardis Hughsarolyn H Ireanna Finlayson, NP 10/14/2021 12:17 PM

## 2021-10-15 ENCOUNTER — Emergency Department (HOSPITAL_COMMUNITY): Payer: Medicaid Other

## 2021-10-15 ENCOUNTER — Inpatient Hospital Stay (HOSPITAL_COMMUNITY)
Admission: EM | Admit: 2021-10-15 | Discharge: 2021-10-21 | DRG: 299 | Disposition: A | Discharge status: Rehabilitation Facility | Payer: Medicaid Other | Attending: Internal Medicine | Admitting: Internal Medicine

## 2021-10-15 ENCOUNTER — Encounter (HOSPITAL_COMMUNITY): Payer: Self-pay | Admitting: Emergency Medicine

## 2021-10-15 DIAGNOSIS — Z888 Allergy status to other drugs, medicaments and biological substances status: Secondary | ICD-10-CM

## 2021-10-15 DIAGNOSIS — F1193 Opioid use, unspecified with withdrawal: Secondary | ICD-10-CM | POA: Diagnosis not present

## 2021-10-15 DIAGNOSIS — Z681 Body mass index (BMI) 19 or less, adult: Secondary | ICD-10-CM

## 2021-10-15 DIAGNOSIS — N179 Acute kidney failure, unspecified: Secondary | ICD-10-CM | POA: Diagnosis not present

## 2021-10-15 DIAGNOSIS — R079 Chest pain, unspecified: Secondary | ICD-10-CM | POA: Diagnosis present

## 2021-10-15 DIAGNOSIS — F17210 Nicotine dependence, cigarettes, uncomplicated: Secondary | ICD-10-CM | POA: Diagnosis present

## 2021-10-15 DIAGNOSIS — G928 Other toxic encephalopathy: Secondary | ICD-10-CM | POA: Diagnosis not present

## 2021-10-15 DIAGNOSIS — F419 Anxiety disorder, unspecified: Secondary | ICD-10-CM | POA: Diagnosis not present

## 2021-10-15 DIAGNOSIS — E7800 Pure hypercholesterolemia, unspecified: Secondary | ICD-10-CM | POA: Diagnosis present

## 2021-10-15 DIAGNOSIS — Z79899 Other long term (current) drug therapy: Secondary | ICD-10-CM

## 2021-10-15 DIAGNOSIS — Z886 Allergy status to analgesic agent status: Secondary | ICD-10-CM | POA: Diagnosis not present

## 2021-10-15 DIAGNOSIS — I129 Hypertensive chronic kidney disease with stage 1 through stage 4 chronic kidney disease, or unspecified chronic kidney disease: Secondary | ICD-10-CM | POA: Diagnosis present

## 2021-10-15 DIAGNOSIS — I959 Hypotension, unspecified: Secondary | ICD-10-CM | POA: Diagnosis not present

## 2021-10-15 DIAGNOSIS — I169 Hypertensive crisis, unspecified: Secondary | ICD-10-CM | POA: Diagnosis not present

## 2021-10-15 DIAGNOSIS — G9340 Encephalopathy, unspecified: Secondary | ICD-10-CM | POA: Diagnosis not present

## 2021-10-15 DIAGNOSIS — Z5900 Homelessness unspecified: Secondary | ICD-10-CM | POA: Diagnosis not present

## 2021-10-15 DIAGNOSIS — I71012 Dissection of descending thoracic aorta: Secondary | ICD-10-CM | POA: Diagnosis present

## 2021-10-15 DIAGNOSIS — R251 Tremor, unspecified: Secondary | ICD-10-CM | POA: Diagnosis not present

## 2021-10-15 DIAGNOSIS — Z720 Tobacco use: Secondary | ICD-10-CM | POA: Diagnosis not present

## 2021-10-15 DIAGNOSIS — Z7982 Long term (current) use of aspirin: Secondary | ICD-10-CM | POA: Diagnosis not present

## 2021-10-15 DIAGNOSIS — F1410 Cocaine abuse, uncomplicated: Secondary | ICD-10-CM | POA: Diagnosis present

## 2021-10-15 DIAGNOSIS — T401X1A Poisoning by heroin, accidental (unintentional), initial encounter: Secondary | ICD-10-CM | POA: Diagnosis not present

## 2021-10-15 DIAGNOSIS — F411 Generalized anxiety disorder: Secondary | ICD-10-CM | POA: Diagnosis not present

## 2021-10-15 DIAGNOSIS — I7103 Dissection of thoracoabdominal aorta: Secondary | ICD-10-CM

## 2021-10-15 DIAGNOSIS — I71019 Dissection of thoracic aorta, unspecified: Secondary | ICD-10-CM | POA: Diagnosis not present

## 2021-10-15 DIAGNOSIS — F32A Depression, unspecified: Secondary | ICD-10-CM | POA: Diagnosis present

## 2021-10-15 DIAGNOSIS — D649 Anemia, unspecified: Secondary | ICD-10-CM | POA: Diagnosis not present

## 2021-10-15 DIAGNOSIS — E876 Hypokalemia: Secondary | ICD-10-CM | POA: Diagnosis present

## 2021-10-15 DIAGNOSIS — I10 Essential (primary) hypertension: Secondary | ICD-10-CM | POA: Diagnosis not present

## 2021-10-15 DIAGNOSIS — R778 Other specified abnormalities of plasma proteins: Secondary | ICD-10-CM | POA: Diagnosis not present

## 2021-10-15 DIAGNOSIS — F1123 Opioid dependence with withdrawal: Secondary | ICD-10-CM | POA: Diagnosis not present

## 2021-10-15 DIAGNOSIS — N1831 Chronic kidney disease, stage 3a: Secondary | ICD-10-CM | POA: Diagnosis present

## 2021-10-15 DIAGNOSIS — R64 Cachexia: Secondary | ICD-10-CM | POA: Diagnosis present

## 2021-10-15 DIAGNOSIS — T40601A Poisoning by unspecified narcotics, accidental (unintentional), initial encounter: Secondary | ICD-10-CM | POA: Diagnosis not present

## 2021-10-15 DIAGNOSIS — Z882 Allergy status to sulfonamides status: Secondary | ICD-10-CM

## 2021-10-15 DIAGNOSIS — I161 Hypertensive emergency: Secondary | ICD-10-CM | POA: Diagnosis present

## 2021-10-15 DIAGNOSIS — I160 Hypertensive urgency: Secondary | ICD-10-CM | POA: Diagnosis not present

## 2021-10-15 LAB — CBC WITH DIFFERENTIAL/PLATELET
Abs Immature Granulocytes: 0.02 10*3/uL (ref 0.00–0.07)
Basophils Absolute: 0 10*3/uL (ref 0.0–0.1)
Basophils Relative: 0 %
Eosinophils Absolute: 0 10*3/uL (ref 0.0–0.5)
Eosinophils Relative: 0 %
HCT: 44 % (ref 36.0–46.0)
Hemoglobin: 14.3 g/dL (ref 12.0–15.0)
Immature Granulocytes: 0 %
Lymphocytes Relative: 23 %
Lymphs Abs: 1.6 10*3/uL (ref 0.7–4.0)
MCH: 28 pg (ref 26.0–34.0)
MCHC: 32.5 g/dL (ref 30.0–36.0)
MCV: 86.3 fL (ref 80.0–100.0)
Monocytes Absolute: 0.4 10*3/uL (ref 0.1–1.0)
Monocytes Relative: 5 %
Neutro Abs: 5.1 10*3/uL (ref 1.7–7.7)
Neutrophils Relative %: 72 %
Platelets: 303 10*3/uL (ref 150–400)
RBC: 5.1 MIL/uL (ref 3.87–5.11)
RDW: 13.4 % (ref 11.5–15.5)
WBC: 7.2 10*3/uL (ref 4.0–10.5)
nRBC: 0 % (ref 0.0–0.2)

## 2021-10-15 LAB — COMPREHENSIVE METABOLIC PANEL
ALT: 20 U/L (ref 0–44)
AST: 22 U/L (ref 15–41)
Albumin: 3.9 g/dL (ref 3.5–5.0)
Alkaline Phosphatase: 52 U/L (ref 38–126)
Anion gap: 11 (ref 5–15)
BUN: 22 mg/dL — ABNORMAL HIGH (ref 6–20)
CO2: 27 mmol/L (ref 22–32)
Calcium: 9.3 mg/dL (ref 8.9–10.3)
Chloride: 99 mmol/L (ref 98–111)
Creatinine, Ser: 1.45 mg/dL — ABNORMAL HIGH (ref 0.44–1.00)
GFR, Estimated: 43 mL/min — ABNORMAL LOW (ref >60)
Glucose, Bld: 106 mg/dL — ABNORMAL HIGH (ref 70–99)
Potassium: 3.8 mmol/L (ref 3.5–5.1)
Sodium: 137 mmol/L (ref 135–145)
Total Bilirubin: 0.9 mg/dL (ref 0.3–1.2)
Total Protein: 6.4 g/dL — ABNORMAL LOW (ref 6.5–8.1)

## 2021-10-15 LAB — MRSA NEXT GEN BY PCR, NASAL: MRSA by PCR Next Gen: NOT DETECTED

## 2021-10-15 LAB — TROPONIN I (HIGH SENSITIVITY)
Troponin I (High Sensitivity): 9 ng/L (ref <18)
Troponin I (High Sensitivity): 9 ng/L (ref <18)

## 2021-10-15 LAB — D-DIMER, QUANTITATIVE: D-Dimer, Quant: 1.62 ug/mL-FEU — ABNORMAL HIGH (ref 0.00–0.50)

## 2021-10-15 LAB — GLUCOSE, CAPILLARY: Glucose-Capillary: 104 mg/dL — ABNORMAL HIGH (ref 70–99)

## 2021-10-15 MED ORDER — DOCUSATE SODIUM 100 MG PO CAPS
100.0000 mg | ORAL_CAPSULE | Freq: Two times a day (BID) | ORAL | Status: DC | PRN
  Filled 2021-10-15: qty 1

## 2021-10-15 MED ORDER — ESMOLOL HCL-SODIUM CHLORIDE 2000 MG/100ML IV SOLN
25.0000 ug/kg/min | INTRAVENOUS | Status: DC
  Administered 2021-10-15 – 2021-10-16 (×2): 25 ug/kg/min via INTRAVENOUS
  Filled 2021-10-15 (×2): qty 100

## 2021-10-15 MED ORDER — HYDROXYZINE HCL 25 MG PO TABS
25.0000 mg | ORAL_TABLET | Freq: Once | ORAL | Status: AC
  Administered 2021-10-15: 25 mg via ORAL
  Filled 2021-10-15: qty 1

## 2021-10-15 MED ORDER — CHLORHEXIDINE GLUCONATE CLOTH 2 % EX PADS
6.0000 | MEDICATED_PAD | Freq: Every day | CUTANEOUS | Status: DC
Start: 2021-10-15 — End: 2021-10-21
  Administered 2021-10-15 – 2021-10-21 (×7): 6 via TOPICAL

## 2021-10-15 MED ORDER — HEPARIN SODIUM (PORCINE) 5000 UNIT/ML IJ SOLN
5000.0000 [IU] | Freq: Three times a day (TID) | INTRAMUSCULAR | Status: DC
  Administered 2021-10-15 – 2021-10-21 (×14): 5000 [IU] via SUBCUTANEOUS
  Filled 2021-10-15 (×17): qty 1

## 2021-10-15 MED ORDER — IOHEXOL 350 MG/ML SOLN
75.0000 mL | Freq: Once | INTRAVENOUS | Status: AC | PRN
  Administered 2021-10-15: 75 mL via INTRAVENOUS

## 2021-10-15 MED ORDER — METOCLOPRAMIDE HCL 5 MG/ML IJ SOLN
10.0000 mg | Freq: Once | INTRAMUSCULAR | Status: AC
  Administered 2021-10-15: 10 mg via INTRAVENOUS
  Filled 2021-10-15: qty 2

## 2021-10-15 MED ORDER — CLEVIDIPINE BUTYRATE 0.5 MG/ML IV EMUL
0.0000 mg/h | INTRAVENOUS | Status: DC
  Administered 2021-10-15: 1 mg/h via INTRAVENOUS
  Administered 2021-10-15: 9 mg/h via INTRAVENOUS
  Administered 2021-10-16: 10 mg/h via INTRAVENOUS
  Administered 2021-10-16: 9 mg/h via INTRAVENOUS
  Administered 2021-10-16: 8 mg/h via INTRAVENOUS
  Filled 2021-10-15 (×2): qty 50
  Filled 2021-10-15: qty 100
  Filled 2021-10-15: qty 50
  Filled 2021-10-15 (×2): qty 100

## 2021-10-15 MED ORDER — ACETAMINOPHEN 500 MG PO TABS
1000.0000 mg | ORAL_TABLET | ORAL | Status: AC
  Administered 2021-10-15: 1000 mg via ORAL
  Filled 2021-10-15: qty 2

## 2021-10-15 MED ORDER — MELATONIN 3 MG PO TABS
3.0000 mg | ORAL_TABLET | Freq: Once | ORAL | Status: AC | PRN
  Administered 2021-10-16: 3 mg via ORAL
  Filled 2021-10-15: qty 1

## 2021-10-15 MED ORDER — POLYETHYLENE GLYCOL 3350 17 G PO PACK
17.0000 g | PACK | Freq: Every day | ORAL | Status: DC | PRN
  Administered 2021-10-20 (×2): 17 g via ORAL
  Filled 2021-10-15 (×2): qty 1

## 2021-10-15 MED ORDER — LISINOPRIL 20 MG PO TABS: 20.0000 mg | ORAL_TABLET | Freq: Every day | ORAL | Status: DC

## 2021-10-15 MED ORDER — LACTATED RINGERS IV SOLN: INTRAVENOUS | Status: DC

## 2021-10-15 MED ORDER — FENTANYL CITRATE PF 50 MCG/ML IJ SOSY
50.0000 ug | PREFILLED_SYRINGE | INTRAMUSCULAR | Status: AC | PRN
  Administered 2021-10-15 – 2021-10-16 (×2): 50 ug via INTRAVENOUS
  Filled 2021-10-15 (×2): qty 1

## 2021-10-15 MED ORDER — ORAL CARE MOUTH RINSE: 15.0000 mL | OROMUCOSAL | Status: DC | PRN

## 2021-10-15 MED ORDER — SODIUM CHLORIDE 0.9 % IV BOLUS
1000.0000 mL | Freq: Once | INTRAVENOUS | Status: AC
  Administered 2021-10-15: 1000 mL via INTRAVENOUS

## 2021-10-15 MED ORDER — LISINOPRIL 10 MG PO TABS
10.0000 mg | ORAL_TABLET | Freq: Once | ORAL | Status: AC
  Administered 2021-10-15: 10 mg via ORAL
  Filled 2021-10-15: qty 1

## 2021-10-15 MED ORDER — CLONIDINE HCL 0.1 MG PO TABS
0.1000 mg | ORAL_TABLET | Freq: Two times a day (BID) | ORAL | Status: DC
  Administered 2021-10-15: 0.1 mg via ORAL
  Filled 2021-10-15: qty 1

## 2021-10-15 MED ORDER — CLEVIDIPINE BUTYRATE 0.5 MG/ML IV EMUL
INTRAVENOUS | Status: AC
  Filled 2021-10-15: qty 50

## 2021-10-15 MED ORDER — LABETALOL HCL 5 MG/ML IV SOLN: 20.0000 mg | Freq: Once | INTRAVENOUS | Status: DC

## 2021-10-15 MED ORDER — LABETALOL HCL 5 MG/ML IV SOLN
5.0000 mg | Freq: Once | INTRAVENOUS | Status: AC
  Administered 2021-10-15: 5 mg via INTRAVENOUS
  Filled 2021-10-15: qty 4

## 2021-10-15 MED ORDER — ACETAMINOPHEN 325 MG PO TABS
650.0000 mg | ORAL_TABLET | ORAL | Status: DC | PRN
  Administered 2021-10-15 – 2021-10-17 (×8): 650 mg via ORAL
  Filled 2021-10-15 (×8): qty 2

## 2021-10-15 MED ORDER — ESMOLOL HCL-SODIUM CHLORIDE 2000 MG/100ML IV SOLN: 25.0000 ug/kg/min | INTRAVENOUS | Status: DC

## 2021-10-15 MED ORDER — NICOTINE 14 MG/24HR TD PT24
14.0000 mg | MEDICATED_PATCH | Freq: Every day | TRANSDERMAL | Status: DC
  Administered 2021-10-15 – 2021-10-21 (×7): 14 mg via TRANSDERMAL
  Filled 2021-10-15 (×7): qty 1

## 2021-10-15 NOTE — ED Notes (Signed)
Patient c/o "slight chest tightness/pain." - Provider made aware - will continue to monitor

## 2021-10-15 NOTE — ED Triage Notes (Signed)
Patient BIB [facility] from [facility] for evaluation of "EKG changes" where patient presented voluntarily for treatment of opioid addiction. Patient received 324mg  ASA and 1x SL NTG for chest pain, pain reduced from 6/10 to 4/10. Patient complains of nausea and vomiting at this time.

## 2021-10-15 NOTE — ED Notes (Signed)
Patient received in bed.  She continues to experience opiate withdrawal symptoms.  Stomach cramping, chills, sweating and visible piloerection.  Patient received comfort meds from prior shift and they are not due yet.  Patient given fluids, a hot pack for her belly cramping and extra blankets.  Encouraged hot shower and will assist shortly.  Will continue to monitor and provide support.  Will attempt to manage withdrawal symptoms.

## 2021-10-15 NOTE — ED Notes (Signed)
Patient transported to CT 

## 2021-10-15 NOTE — H&P (Signed)
NAME:  [name], MRN:  [id number], DOB:  Jan 12, 1966, LOS: 0 ADMISSION DATE:  10/15/2021, CONSULTATION DATE:  10/15/2021  REFERRING MD:  ED , Blanchie Dessert, CHIEF COMPLAINT:  chest pain   History of Present Illness:  56 year old woman presented voluntarily to behavioral health urgent care 6/19 for treatment of opiate addiction.  She developed chest pain 6/21 and was brought in by EMS to ED for evaluation of "EKG changes".  Given aspirin and sublingual nitro on route.  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  True lumen was 20% She was treated with IV labetalol and IV esmolol and Cleviprex was started. PCCM consulted UDS positive for cocaine and morphine on 6/19  Pertinent  Medical History  Hypertension Polysubstance abuse -cocaine, heroin, opiates, tobacco abuse Depression Homelessness  Significant Hospital Events: Including procedures, antibiotic start and stop dates in addition to other pertinent events     Interim History / Subjective:  Chest pain has decreased to 5/10 Under left breast, nonradiating, has been ongoing for months intermittently, not related to exertion, worse over the last 2 days  Objective   Blood pressure (!) 144/79, pulse 71, temperature 99.2 F (37.3 C), temperature source Oral, resp. rate (!) 36, SpO2 99 %.        Intake/Output Summary (Last 24 hours) at 10/15/2021 1921 Last data filed at 10/15/2021 1902 Gross per 24 hour  Intake 1.61 ml  Output --  Net 1.61 ml   There were no vitals filed for this visit.  Examination: General: Adult woman lying supine in ED stretcher, no distress HENT: No pallor, icterus, no JVD Lungs: Clear breath sounds bilateral, no accessory muscle use Cardiovascular: S1-S2 regular, no murmur, no chest wall tenderness Abdomen: Soft, nontender  no hepatosplenomegaly Extremities: No deformity, no edema Neuro: Alert oriented x3, nonfocal    Labs show normal electrolytes, creatinine 1.4, no leukocytosis or anemia , negative troponin  EKG showed LVH, QTc 513   Resolved Hospital Problem list     Assessment & Plan:  Type B aortic dissection , contributing factors uncontrolled hypertension cocaine abuse Hypertensive crisis  -Conservative management, target systolic blood pressure less than 120 using esmolol and Cleviprex with goal heart rate less than 80 -We will need CT abdomen/pelvis to evaluate extent of dissection into abdominal aorta , but since he has just received contrast will hold off for 48 hours -Should she develop worsening chest pain or abdominal plain or lower extremity pain, should obtain CT angiogram chest/abdomen/pelvis immediately -Resume clonidine, hold lisinopril , can start oral beta-blockade in a.m.  AKI -baseline creatinine 1.2 Hydration with IV fluids since she just got contrast Monitor creatinine   Polysubstance abuse -watch for detox symptoms and symptomatic management  Best Practice (right click and "Reselect all SmartList Selections" daily)   Diet/type: Regular consistency (see orders) DVT prophylaxis:  GI prophylaxis: N/A Lines: N/A Foley:  N/A Code Status:  full code Last date of multidisciplinary goals of care discussion [NA]  Labs   CBC: Recent Labs  Lab 10/12/21 1234 10/15/21 1332  WBC 4.3 7.2  NEUTROABS 3.5 5.1  HGB 11.7* 14.3  HCT 36.8 44.0  MCV 87.4 86.3  PLT 246 303    Basic Metabolic Panel: Recent Labs  Lab 10/12/21 1234 10/15/21 1332  NA 141 137  K 4.5 3.8  CL 105 99  CO2 29 27  GLUCOSE 103* 106*  BUN 18 22*  CREATININE 1.23* 1.45*  CALCIUM 9.4 9.3   GFR: CrCl cannot be calculated (Unknown ideal weight.). Recent Labs  Lab 10/12/21 1234 10/15/21 1332  WBC 4.3 7.2    Liver Function Tests: Recent Labs  Lab 10/12/21 1234 10/15/21 1332  AST 22 22   ALT 22 20  ALKPHOS 46 52  BILITOT 0.4 0.9  PROT 5.6* 6.4*  ALBUMIN 3.7 3.9   No results for input(s): "LIPASE", "AMYLASE" in the last 168 hours. No results for input(s): "AMMONIA" in the last 168 hours.  ABG No results found for: "PHART", "PCO2ART", "PO2ART", "HCO3", "TCO2", "ACIDBASEDEF", "O2SAT"   Coagulation Profile: No results for input(s): "INR", "PROTIME" in the last 168 hours.  Cardiac Enzymes: No results for input(s): "CKTOTAL", "CKMB", "CKMBINDEX", "TROPONINI" in the last 168 hours.  HbA1C: Hgb A1c MFr Bld  Date/Time Value Ref Range Status  10/12/2021 12:34 PM 5.1 4.8 - 5.6 % Final    Comment:    (NOTE) Pre diabetes:          5.7%-6.4%  Diabetes:              >6.4%  Glycemic control for   <7.0% adults with diabetes     CBG: No results for input(s): "GLUCAP" in the last 168 hours.  Review of Systems:   Intermittent chest pain for several months 60 pounds weight loss  Past Medical History:  She,  has a past medical history of Carpal tunnel syndrome, Hypercholesteremia, and Hypertension.   Surgical History:   Past Surgical History:  Procedure Laterality Date   CARPAL TUNNEL RELEASE     CESAREAN SECTION     CHOLECYSTECTOMY       Social History:   reports that she has been smoking cigarettes. She has been smoking an average of .3 packs per day. She has never used smokeless tobacco. She reports current drug use. She reports that she does not drink alcohol.   Family History:  Her family history includes Cancer in her maternal grandfather; Hypertension in her father and mother.   Allergies Allergies  Allergen Reactions   Ibuprofen Other (See Comments)    Blood in stool   Benadryl [Diphenhydramine] Anxiety   Sulfa Antibiotics Itching     Home Medications  Prior to Admission medications   Medication Sig Start Date End Date Taking? Authorizing Provider  acetaminophen (TYLENOL) 650 MG CR tablet Take 650 mg by mouth every 6 (six) hours as needed for  pain.   Yes [provider]  alum & mag hydroxide-simeth (MAALOX/MYLANTA) 200-200-20 MG/5ML suspension Take 30 mLs by mouth every 4 (four) hours as needed for indigestion.   Yes [provider]  aspirin EC 81 MG tablet Take 81 mg by mouth daily. Swallow whole.   Yes [provider]  cloNIDine (CATAPRES) 0.1 MG tablet Take 0.1 mg by mouth daily. 08/28/21  Yes [provider]  Cyanocobalamin (VITAMIN B-12 PO) Take 1 tablet by mouth daily.   Yes [provider]  dicyclomine (BENTYL) 20 MG tablet Take 20 mg by mouth every 6 (six) hours as needed for spasms.   Yes [provider]  hydrOXYzine (ATARAX) 25 MG tablet Take 25 mg by mouth every 6 (six) hours as needed for anxiety.   Yes [provider]  lisinopril (ZESTRIL) 10 MG tablet Take 10 mg by mouth daily. 08/28/21  Yes [provider]  loperamide (IMODIUM) 2 MG capsule Take 2-4 mg by mouth as needed for  diarrhea or loose stools.   Yes [provider]  Magnesium Hydroxide (MILK OF MAGNESIA PO) Take 30 mLs by mouth daily as needed (constipation).   Yes [provider]  methocarbamol (ROBAXIN) 500 MG tablet Take 500 mg by mouth every 8 (eight) hours as needed for muscle spasms.   Yes [provider]  Multiple Vitamin (MULTIVITAMIN WITH MINERALS) TABS tablet Take 1 tablet by mouth daily.   Yes [provider]  Naproxen Sod-diphenhydrAMINE (ALEVE PM) 220-25 MG TABS Take 2 tablets by mouth at bedtime as needed (For sleep or pain).   Yes [provider]  ondansetron (ZOFRAN-ODT) 4 MG disintegrating tablet Take 4 mg by mouth every 6 (six) hours as needed for nausea or vomiting.   Yes [provider]  PRESCRIPTION MEDICATION Inject 4 mg into the skin once. Ondansetron 4 mg injection   Yes [provider]  Probiotic Product (PROBIOTIC PO) Take 1 tablet by mouth daily.   Yes [provider]  traZODone (DESYREL) 50 MG tablet  Take 50 mg by mouth at bedtime as needed for sleep.   Yes [provider]  Ferrous Sulfate (IRON PO) Take 1 tablet by mouth daily.    [provider]  hydrochlorothiazide (HYDRODIURIL) 25 MG tablet Take 25 mg by mouth daily. Patient not taking: Reported on 10/15/2021    [provider]  lisinopril (ZESTRIL) 20 MG tablet Take 20 mg by mouth daily. Patient not taking: Reported on 10/15/2021    [provider]     Critical care time: 69 m       Cyril Mourning MD. [facility]. [place] [facility] Pager : [phone]  If no response to pager , please call [phone] until 7 pm After 7:00 pm call Elink  [phone]   10/15/2021

## 2021-10-15 NOTE — ED Provider Notes (Cosign Needed)
[facility] EMERGENCY DEPARTMENT Provider Note   CSN: [id number] Arrival date & time: 10/15/21  1326     History  No chief complaint on file.   [name] is a 56 y.o. female with a past medical history of hypertension, polysubstance use.  Patient presents from behavioral health urgent care with a chief complaint of chest pain..  Patient presents to behavioral health urgent care on 10/12/2021 for assistance with detox from heroin, cocaine, and opiate pain medication.  Patient reports that chest pain started yesterday afternoon.  Pain has been constant since then.  Pain has waxed and waned in intensity.  Patient describes pain as a "muscle spasm/aching."  Pain is located under her left breast and does not radiate.  At present patient rates pain 5/10 on pain scale.  Patient reports the pain is worse when her blood pressure is high.  Patient states that she took her blood pressure medication earlier today.  Patient has not noted any alleviating factors.  Patient does endorse nausea and vomiting however states that she has been dealing with the symptoms since the 19th when she started her detox.  Patient denies any fever, chills, cough, hemoptysis, leg swelling or tenderness, palpitations, abdominal pain, lightheadedness, dizziness, syncope, history of DVT/PE, surgery in the last 12 weeks, history of malignancy, hormone therapy, past CVA, diabetes mellitus, family history of CAD less than 81 years old.  Patient does endorse cigarette use.  HPI     Home Medications Prior to Admission medications   Medication Sig Start Date End Date Taking? Authorizing Provider  aspirin EC 81 MG tablet Take 81 mg by mouth daily. Swallow whole.    [provider]  cloNIDine (CATAPRES) 0.1 MG tablet Take 0.1 mg by mouth daily. 08/28/21   [provider]  Cyanocobalamin (VITAMIN B-12 PO) Take 1 tablet by mouth daily.    [provider]  Ferrous Sulfate (IRON PO) Take  1 tablet by mouth daily.    [provider]  lisinopril (ZESTRIL) 10 MG tablet Take 10 mg by mouth daily. 08/28/21   [provider]  Multiple Vitamin (MULTIVITAMIN WITH MINERALS) TABS tablet Take 1 tablet by mouth daily.    [provider]  Naproxen Sod-diphenhydrAMINE (ALEVE PM) 220-25 MG TABS Take 2 tablets by mouth at bedtime as needed (For sleep or pain).    [provider]      Allergies    Ibuprofen, Benadryl [diphenhydramine], and Sulfa antibiotics    Review of Systems   Review of Systems  Constitutional:  Negative for chills and fever.  Eyes:  Negative for visual disturbance.  Respiratory:  Negative for shortness of breath.   Cardiovascular:  Positive for chest pain. Negative for palpitations and leg swelling.  Gastrointestinal:  Positive for diarrhea, nausea and vomiting. Negative for abdominal pain.  Musculoskeletal:  Negative for back pain and neck pain.  Skin:  Negative for color change and rash.  Neurological:  Negative for dizziness, syncope, light-headedness and headaches.  Psychiatric/Behavioral:  Negative for confusion.     Physical Exam Updated Vital Signs BP 120/85 (BP Location: Right Arm)   Pulse 98   Temp 99.2 F (37.3 C) (Oral)   Resp 16   SpO2 98%  Physical Exam Vitals and nursing note reviewed.  Constitutional:      General: She is not in acute distress.    Appearance: She is not ill-appearing, toxic-appearing or diaphoretic.  HENT:     Head: Normocephalic.  Eyes:  General: No scleral icterus.       Right eye: No discharge.        Left eye: No discharge.  Cardiovascular:     Rate and Rhythm: Normal rate.     Pulses:          Radial pulses are 2+ on the right side and 2+ on the left side.     Heart sounds: Normal heart sounds. Heart sounds not distant. No murmur heard. Pulmonary:     Effort: Pulmonary effort is normal. No tachypnea or bradypnea.     Breath sounds: Normal breath sounds. No stridor.   Abdominal:     General: Abdomen is flat. There is no distension. There are no signs of injury.     Palpations: Abdomen is soft. There is no mass or pulsatile mass.     Tenderness: There is no abdominal tenderness.  Musculoskeletal:     Cervical back: Neck supple.     Right lower leg: No swelling, deformity, lacerations, tenderness or bony tenderness. No edema.     Left lower leg: No swelling, deformity, lacerations, tenderness or bony tenderness. No edema.  Skin:    General: Skin is warm and dry.  Neurological:     General: No focal deficit present.     Mental Status: She is alert.     GCS: GCS eye subscore is 4. GCS verbal subscore is 5. GCS motor subscore is 6.  Psychiatric:        Behavior: Behavior is cooperative.     ED Results / Procedures / Treatments   Labs (all labs ordered are listed, but only abnormal results are displayed) Labs Reviewed  COMPREHENSIVE METABOLIC PANEL - Abnormal; Notable for the following components:      Result Value   Glucose, Bld 106 (*)    BUN 22 (*)    Creatinine, Ser 1.45 (*)    Total Protein 6.4 (*)    GFR, Estimated 43 (*)    All other components within normal limits  CBC WITH DIFFERENTIAL/PLATELET  TROPONIN I (HIGH SENSITIVITY)    EKG EKG Interpretation  Date/Time:  Thursday October 15 2021 13:34:42 EDT Ventricular Rate:  98 PR Interval:  136 QRS Duration: 80 QT Interval:  402 QTC Calculation: 513 R Axis:   77 Text Interpretation: Sinus rhythm with Premature atrial complexes Possible Left atrial enlargement Minimal voltage criteria for LVH, may be normal variant ( Cornell product ) Nonspecific T wave abnormality Prolonged QT Abnormal ECG When compared with ECG of 15-Oct-2021 12:16, PREVIOUS ECG IS PRESENT Confirmed by Dene Gentry [phone]) on 10/15/2021 2:02:43 PM  Radiology DG Chest 2 View  Result Date: 10/15/2021 CLINICAL DATA:  Chest pain, weakness EXAM: CHEST - 2 VIEW COMPARISON:  08/30/2018 FINDINGS: The heart size and  mediastinal contours are within normal limits. Both lungs are clear. Disc degenerative disease of the thoracic spine. IMPRESSION: No acute abnormality of the lungs. Electronically Signed   By: Delanna Ahmadi M.D.   On: 10/15/2021 14:02    Procedures Procedures    Medications Ordered in ED Medications - No data to display  ED Course/ Medical Decision Making/ A&P                           Medical Decision Making Amount and/or Complexity of Data Reviewed Labs: ordered.   Alert 56 year old female in no acute distress, nontoxic-appearing.  Presents emergency department with a chief complaint of chest pain.  Information obtained from  patient.  I reviewed patient's past medical records including previous provider notes from specialists, labs, and imaging.  Patient has medical history as outlined in HPI which complicates her care  Per chart review patient was seen at [facility] on 6/19 for detox.  Patient has been patient had be helped since then.  Patient presents emerged department today for reports of chest pain after EKG showed new T wave inversion.  ACS work-up initiated in the emergency department for further evaluation.  Patient noted to have T wave inversion to leads V2, V3, V4, V5 on EKG obtained this morning at behavioral health urgent care at 7 AM.  I personally viewed and interpreted patient's EKG from her ED visit which shows sinus rhythm with PACs and T wave inversion to V2 and V3.  I personally viewed and interpreted patient's chest x-ray.  Imaging shows no active cardiopulmonary disease.  I personally viewed and interpreted patient's lab results.  Pertinent findings include: -CBC unremarkable -Creatinine 1.45, BUN 22; suspect this is secondary to patient's vomiting and diarrhea from her detox. -Troponin 9  Patient has heart score of 3 which puts her in low risk category.  Plan at this time is to obtain delta troponin.  Patient is in low risk category for PE based on Wells criteria.   Will obtain D-dimer to rule out possible PE causing patient's symptoms.  If second troponin and dimer within normal this would discharge patient back to behavioral health urgent care.  We will give patient 1 L fluid bolus due to her elevated creatinine at this time.  Patient care discussed with attending physician Dr.Messick  Patient care transferred to PA [place] at the end of my shift. Patient presentation, ED course, and plan of care discussed with review of all pertinent labs and imaging. Please see his/her note for further details regarding further ED course and disposition.         Final Clinical Impression(s) / ED Diagnoses Final diagnoses:  None    Rx / DC Orders ED Discharge Orders     None         Haskel Schroeder, PA-C 10/15/21 1507

## 2021-10-15 NOTE — Op Note (Deleted)
[facility] [place]  ASSESSMENT / PLAN: 56 y.o. 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   Recommend admission to PCCM for blood pressure control. Target HR <80. Target SBP < 120. No evidence of malperfusion at the moment. Repeat CT angiogram of chest / abdomen / pelvis in 48 hours.  Should she develop worsening chest pain despite blood pressure control, severe acute abdominal pain, or lower extremity pain, recommend CT angiogram of the chest / abdomen / pelvis immediately.  Will follow closely.   CHIEF COMPLAINT: Chest pain  HISTORY OF PRESENT ILLNESS: [name] is a 56 y.o. female who presents to [facility], ER for evaluation of chest pain.  The patient reports several month history of chest pain which has been related to episodes of hypertension.  Patient is currently in recovery for substance abuse.  She reports she has been using cocaine and heroin and other opiates over the past 2 years.  She has lost about 60 pounds.  She attributes this to depression and homelessness.  The patient has no abdominal pain at this time.  She has no pain in her legs.  She reports no pain with walking.  Past Medical History:  Diagnosis Date   Carpal tunnel syndrome    Hypercholesteremia    Hypertension     Past Surgical History:  Procedure Laterality Date   CARPAL TUNNEL RELEASE     CESAREAN SECTION     CHOLECYSTECTOMY      Family History  Problem Relation Age of Onset   Hypertension Mother    Hypertension Father    Cancer Maternal Grandfather     Social History   Socioeconomic History   Marital status: Married    Spouse name: Not on file   Number of children: Not on file   Years of education: Not on file   Highest education level: Not on file  Occupational History   Not on file  Tobacco Use   Smoking status: Every Day    Packs/day: 0.30    Types: Cigarettes   Smokeless tobacco:  Never  Substance and Sexual Activity   Alcohol use: No   Drug use: Yes    Comment: Heroin use, last use 08/29/2018   Sexual activity: Not Currently    Partners: Male    Birth control/protection: None    Comment: abstaining due to comfort  Other Topics Concern   Not on file  Social History Narrative   Not on file   Social Determinants of Health   Financial Resource Strain: Not on file  Food Insecurity: Not on file  Transportation Needs: Not on file  Physical Activity: Not on file  Stress: Not on file  Social Connections: Not on file  Intimate Partner Violence: Not on file    Allergies  Allergen Reactions   Ibuprofen Other (See Comments)    Blood in stool   Benadryl [Diphenhydramine] Anxiety   Sulfa Antibiotics Itching    Current Facility-Administered Medications  Medication Dose Route Frequency Provider Last Rate Last Admin   esmolol (BREVIBLOC) 2000 mg / 100 mL (20 mg/mL) infusion  25-300 mcg/kg/min (Order-Specific) Intravenous Continuous Terald Sleeper, MD   Held at 10/15/21 1823   fentaNYL (SUBLIMAZE) injection 50 mcg  50 mcg Intravenous Q2H PRN Gailen Shelter, PA       Current Outpatient Medications  Medication Sig Dispense Refill   acetaminophen (TYLENOL) 650 MG CR tablet Take 650 mg by  mouth every 6 (six) hours as needed for pain.     alum & mag hydroxide-simeth (MAALOX/MYLANTA) 200-200-20 MG/5ML suspension Take 30 mLs by mouth every 4 (four) hours as needed for indigestion.     aspirin EC 81 MG tablet Take 81 mg by mouth daily. Swallow whole.     cloNIDine (CATAPRES) 0.1 MG tablet Take 0.1 mg by mouth daily.     Cyanocobalamin (VITAMIN B-12 PO) Take 1 tablet by mouth daily.     dicyclomine (BENTYL) 20 MG tablet Take 20 mg by mouth every 6 (six) hours as needed for spasms.     hydrOXYzine (ATARAX) 25 MG tablet Take 25 mg by mouth every 6 (six) hours as needed for anxiety.     lisinopril (ZESTRIL) 10 MG tablet Take 10 mg by mouth daily.     loperamide (IMODIUM)  2 MG capsule Take 2-4 mg by mouth as needed for diarrhea or loose stools.     Magnesium Hydroxide (MILK OF MAGNESIA PO) Take 30 mLs by mouth daily as needed (constipation).     methocarbamol (ROBAXIN) 500 MG tablet Take 500 mg by mouth every 8 (eight) hours as needed for muscle spasms.     Multiple Vitamin (MULTIVITAMIN WITH MINERALS) TABS tablet Take 1 tablet by mouth daily.     Naproxen Sod-diphenhydrAMINE (ALEVE PM) 220-25 MG TABS Take 2 tablets by mouth at bedtime as needed (For sleep or pain).     ondansetron (ZOFRAN-ODT) 4 MG disintegrating tablet Take 4 mg by mouth every 6 (six) hours as needed for nausea or vomiting.     PRESCRIPTION MEDICATION Inject 4 mg into the skin once. Ondansetron 4 mg injection     Probiotic Product (PROBIOTIC PO) Take 1 tablet by mouth daily.     traZODone (DESYREL) 50 MG tablet Take 50 mg by mouth at bedtime as needed for sleep.     Ferrous Sulfate (IRON PO) Take 1 tablet by mouth daily.     hydrochlorothiazide (HYDRODIURIL) 25 MG tablet Take 25 mg by mouth daily. (Patient not taking: Reported on 10/15/2021)     lisinopril (ZESTRIL) 20 MG tablet Take 20 mg by mouth daily. (Patient not taking: Reported on 10/15/2021)      PHYSICAL EXAM Vitals:   10/15/21 1800 10/15/21 1818 10/15/21 1819 10/15/21 1822  BP: (!) 168/94     Pulse: 70 67 (!) 58 67  Resp: (!) 29 (!) 21 (!) 27 13  Temp:      TempSrc:      SpO2: 100% 100% 100% 100%   Constitutional: Cachectic.  Tearful. Cardiac: regular rate and rhythm.  Respiratory:  unlabored. Abdominal:  soft, non-tender, non-distended.  Peripheral vascular: 1+ femoral pulses bilaterally  PERTINENT LABORATORY AND RADIOLOGIC DATA  Most recent CBC    Latest Ref Rng & Units 10/15/2021    1:32 PM 10/12/2021   12:34 PM 08/30/2018    3:29 PM  CBC  WBC 4.0 - 10.5 K/uL 7.2  4.3  7.2   Hemoglobin 12.0 - 15.0 g/dL 14.3  11.7  12.6   Hematocrit 36.0 - 46.0 % 44.0  36.8  38.0   Platelets 150 - 400 K/uL 303  246  350      Most  recent CMP    Latest Ref Rng & Units 10/15/2021    1:32 PM 10/12/2021   12:34 PM 08/30/2018    3:29 PM  CMP  Glucose 70 - 99 mg/dL 106  103  99   BUN 6 - 20 mg/dL  22  18  6    Creatinine 0.44 - 1.00 mg/dL  1.00  7.12   Sodium 135 - 145 mmol/L 137  141  137   Potassium 3.5 - 5.1 mmol/L 3.8  4.5  3.4   Chloride 98 - 111 mmol/L 99  105  98   CO2 22 - 32 mmol/L 27  29  21    Calcium 8.9 - 10.3 mg/dL 9.3  9.4  9.5   Total Protein 6.5 - 8.1 g/dL 6.4  5.6  7.5   Total Bilirubin 0.3 - 1.2 mg/dL 0.9  0.4  0.7   Alkaline Phos 38 - 126 U/L 52  46  71   AST 15 - 41 U/L 22  22  25    ALT 0 - 44 U/L 20  22  23     CT angiogram of the chest shows type B aortic dissection with thrombosed false lumen.  The dissection is incompletely studied.  We are able to see the celiac axis opacify from the true lumen.  1.97. , MD [facility] Phone Number: [phone] 10/15/2021 6:30 PM  Total time spent on preparing this encounter including chart review, data review, collecting history, examining the patient, coordinating care for this new patient, 60 minutes.  Portions of this report may have been transcribed using voice recognition software.  Every effort has been made to ensure accuracy; however, inadvertent computerized transcription errors may still be present.

## 2021-10-15 NOTE — ED Notes (Signed)
Patient continues to rest with no sxs of discomfort - will continue to monitor for safety 

## 2021-10-15 NOTE — ED Provider Notes (Cosign Needed)
Accepted handoff at shift change from PB PA-C. Please see prior provider note for more detail.   Briefly: Patient is 56 y.o.   "[name] is a 56 y.o. female with a past medical history of hypertension, polysubstance use.  Patient presents from behavioral health urgent care with a chief complaint of chest pain..  Patient presents to behavioral health urgent care on 10/12/2021 for assistance with detox from heroin, cocaine, and opiate pain medication.   Patient reports that chest pain started yesterday afternoon.  Pain has been constant since then.  Pain has waxed and waned in intensity.  Patient describes pain as a "muscle spasm/aching."  Pain is located under her left breast and does not radiate.  At present patient rates pain 5/10 on pain scale.  Patient reports the pain is worse when her blood pressure is high.  Patient states that she took her blood pressure medication earlier today.  Patient has not noted any alleviating factors.  Patient does endorse nausea and vomiting however states that she has been dealing with the symptoms since the 19th when she started her detox.   Patient denies any fever, chills, cough, hemoptysis, leg swelling or tenderness, palpitations, abdominal pain, lightheadedness, dizziness, syncope, history of DVT/PE, surgery in the last 12 weeks, history of malignancy, hormone therapy, past CVA, diabetes mellitus, family history of CAD less than 63 years old.   Patient does endorse cigarette use."   DDX: concern for PE/ACS  Plan: FU on dimer, CT if +     Physical Exam  BP (!) 168/94   Pulse 70   Temp 99.2 F (37.3 C) (Oral)   Resp (!) 29   SpO2 100%   Physical Exam Vitals and nursing note reviewed.  Constitutional:      General: She is not in acute distress. HENT:     Head: Normocephalic and atraumatic.     Nose: Nose normal.  Eyes:     General: No scleral icterus. Cardiovascular:     Rate and Rhythm: Normal rate and regular rhythm.     Pulses:  Normal pulses.     Heart sounds: Normal heart sounds.     Comments: Bilateral radial and DP pulses 3+ and symmetric Pulmonary:     Effort: Pulmonary effort is normal. No respiratory distress.     Breath sounds: No wheezing.  Abdominal:     Palpations: Abdomen is soft.     Tenderness: There is no abdominal tenderness.  Musculoskeletal:     Cervical back: Normal range of motion.     Right lower leg: No edema.     Left lower leg: No edema.  Skin:    General: Skin is warm and dry.     Capillary Refill: Capillary refill takes less than 2 seconds.  Neurological:     Mental Status: She is alert. Mental status is at baseline.  Psychiatric:        Mood and Affect: Mood normal.        Behavior: Behavior normal.     Procedures  .Critical Care  Performed by: Gailen Shelter, PA Authorized by: Gailen Shelter, PA   Critical care provider statement:    Critical care time (minutes):  35   Critical care time was exclusive of:  Separately billable procedures and treating other patients and teaching time   Critical care was necessary to treat or prevent imminent or life-threatening deterioration of the following conditions: Thoracic aortic dissection.   Critical care was time spent personally by  me on the following activities:  Development of treatment plan with patient or surrogate, review of old charts, re-evaluation of patient's condition, pulse oximetry, ordering and review of radiographic studies, ordering and review of laboratory studies, ordering and performing treatments and interventions, obtaining history from patient or surrogate, examination of patient and evaluation of patient's response to treatment   Care discussed with: admitting provider    Results for orders placed or performed during the hospital encounter of 10/15/21  CBC with Differential  Result Value Ref Range   WBC 7.2 4.0 - 10.5 K/uL   RBC 5.10 3.87 - 5.11 MIL/uL   Hemoglobin 14.3 12.0 - 15.0 g/dL   HCT 90.2 11.1 -  55.2 %   MCV 86.3 80.0 - 100.0 fL   MCH 28.0 26.0 - 34.0 pg   MCHC 32.5 30.0 - 36.0 g/dL   RDW 08.0 22.3 - 36.1 %   Platelets 303 150 - 400 K/uL   nRBC 0.0 0.0 - 0.2 %   Neutrophils Relative % 72 %   Neutro Abs 5.1 1.7 - 7.7 K/uL   Lymphocytes Relative 23 %   Lymphs Abs 1.6 0.7 - 4.0 K/uL   Monocytes Relative 5 %   Monocytes Absolute 0.4 0.1 - 1.0 K/uL   Eosinophils Relative 0 %   Eosinophils Absolute 0.0 0.0 - 0.5 K/uL   Basophils Relative 0 %   Basophils Absolute 0.0 0.0 - 0.1 K/uL   Immature Granulocytes 0 %   Abs Immature Granulocytes 0.02 0.00 - 0.07 K/uL  Comprehensive metabolic panel  Result Value Ref Range   Sodium 137 135 - 145 mmol/L   Potassium 3.8 3.5 - 5.1 mmol/L   Chloride 99 98 - 111 mmol/L   CO2 27 22 - 32 mmol/L   Glucose, Bld 106 (H) 70 - 99 mg/dL   BUN 22 (H) 6 - 20 mg/dL   Creatinine, Ser 2.24 (H) 0.44 - 1.00 mg/dL   Calcium 9.3 8.9 - 49.7 mg/dL   Total Protein 6.4 (L) 6.5 - 8.1 g/dL   Albumin 3.9 3.5 - 5.0 g/dL   AST 22 15 - 41 U/L   ALT 20 0 - 44 U/L   Alkaline Phosphatase 52 38 - 126 U/L   Total Bilirubin 0.9 0.3 - 1.2 mg/dL   GFR, Estimated 43 (L) >60 mL/min   Anion gap 11 5 - 15  D-dimer, quantitative  Result Value Ref Range   D-Dimer, Quant 1.62 (H) 0.00 - 0.50 ug/mL-FEU  Troponin I (High Sensitivity)  Result Value Ref Range   Troponin I (High Sensitivity) 9 <18 ng/L   CT Angio Chest PE W/Cm &/Or Wo Cm  Result Date: 10/15/2021 CLINICAL DATA:  Chest pain with nausea and vomiting. EXAM: CT ANGIOGRAPHY CHEST WITH CONTRAST TECHNIQUE: Multidetector CT imaging of the chest was performed using the standard protocol during bolus administration of intravenous contrast. Multiplanar CT image reconstructions and MIPs were obtained to evaluate the vascular anatomy. RADIATION DOSE REDUCTION: This exam was performed according to the departmental dose-optimization program which includes automated exposure control, adjustment of the mA and/or kV according to  patient size and/or use of iterative reconstruction technique. CONTRAST:  37mL OMNIPAQUE IOHEXOL 350 MG/ML SOLN COMPARISON:  None Available. FINDINGS: Cardiovascular: There is dissection of a dilated distal aortic arch and descending thoracic aorta (approximately 4.7 cm in diameter. This dissection extends from the descending portion of the aortic arch into the visualized portion of the abdominal aorta. The true lumen of the thoracic aorta measures  approximately 20% of the total aortic lumen. Satisfactory opacification of the pulmonary arteries to the segmental level. No evidence of pulmonary embolism. Normal heart size. No pericardial effusion. Mediastinum/Nodes: No enlarged mediastinal, hilar, or axillary lymph nodes. Thyroid gland, trachea, and esophagus demonstrate no significant findings. Lungs/Pleura: Lungs are clear. No pleural effusion or pneumothorax. Upper Abdomen: No acute abnormality. Musculoskeletal: Multilevel degenerative changes seen throughout the thoracic spine. Review of the MIP images confirms the above findings. 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 Further evaluation with CTA of the abdomen and pelvis is recommended to determine the extent of the dissection within the abdomen. Electronically Signed   By: Aram Candela M.D.   On: 10/15/2021 17:50   DG Chest 2 View  Result Date: 10/15/2021 CLINICAL DATA:  Chest pain, weakness EXAM: CHEST - 2 VIEW COMPARISON:  08/30/2018 FINDINGS: The heart size and mediastinal contours are within normal limits. Both lungs are clear. Disc degenerative disease of the thoracic spine. IMPRESSION: No acute abnormality of the lungs. Electronically Signed   By: Jearld Lesch M.D.   On: 10/15/2021 14:02      ED Course / MDM   Clinical Course as of 10/15/21 1821  Thu Oct 15, 2021  1732 BP [WF]  1801 Called by radiology  for dissection of aorta. Discussed with Dr. Lenell Antu who recommends   Esmolol   PCCM admit. Vasc to admit.  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 Further evaluation with CTA of the abdomen and pelvis is recommended to determine the extent of the dissection within the abdomen.   Electronically Signed   By: Aram Candela M.D.   On: 10/15/2021 17:50   [WF]    Clinical Course User Index [MT] Trifan, Kermit Balo, MD [WF] Gailen Shelter, [place]   Medical Decision Making Amount and/or Complexity of Data Reviewed Labs: ordered. Radiology: ordered.  Risk OTC drugs. Prescription drug management. Decision regarding hospitalization.   This patient presents to the ED for concern of chest pain, this involves a number of treatment options, and is a complaint that carries with it a high risk of complications and morbidity.  The differential diagnosis includes The emergent causes of chest pain include: Acute coronary syndrome, tamponade, pericarditis/myocarditis, aortic dissection, pulmonary embolism, tension pneumothorax, pneumonia, and esophageal rupture.   Co morbidities: Discussed in HPI   Brief History:  "[name] is a 56 y.o. female with a past medical history of hypertension, polysubstance use.  Patient presents from behavioral health urgent care with a chief complaint of chest pain..  Patient presents to behavioral health urgent care on 10/12/2021 for assistance with detox from heroin, cocaine, and opiate pain medication.   Patient reports that chest pain started yesterday afternoon.  Pain has been constant since then.  Pain has waxed and waned in intensity.  Patient  describes pain as a "muscle spasm/aching."  Pain is located under her left breast and does not radiate.  At present patient rates pain 5/10 on pain scale.  Patient reports the pain is worse when her blood pressure is high.  Patient states that she took her blood pressure medication earlier  today.  Patient has not noted any alleviating factors.  Patient does endorse nausea and vomiting however states that she has been dealing with the symptoms since the 19th when she started her detox.   Patient denies any fever, chills, cough, hemoptysis, leg swelling or tenderness, palpitations, abdominal pain, lightheadedness, dizziness, syncope, history of DVT/PE, surgery in the last 12 weeks, history of malignancy, hormone therapy, past CVA, diabetes mellitus, family history of CAD less than 49 years old.   Patient does endorse cigarette use."    EMR reviewed including pt PMHx, past surgical history and past visits to ER.   See HPI for more details   Lab Tests:   I ordered and independently interpreted labs. Labs notable for elevated D-dimer of 1.62 will obtain CT PE scan.   Imaging Studies:  Abnormal findings. I personally reviewed all imaging studies. Imaging notable for Acute aortic dissection   Cardiac Monitoring:  The patient was maintained on a cardiac monitor.  I personally viewed and interpreted the cardiac monitored which showed an underlying rhythm of: NSR EKG non-ischemic T wave inversions   Medicines ordered:  I ordered medication including Tylenol, fentanyl Reglan and Atarax for chest pain, nausea Reevaluation of the patient after these medicines showed that the patient improved I have reviewed the patients home medicines and have made adjustments as needed   Critical Interventions:  Consult with vascular surgery and critical care   Consults/Attending Physician   I discussed this case with my attending physician who cosigned this note including patient's presenting  symptoms, physical exam, and planned diagnostics and interventions. Attending physician stated agreement with plan or made changes to plan which were implemented.  Discussed with Dr. Lenell Antu of vascular surgery  Discussed with critical care.  They will evaluate patient and likely admit.   Reevaluation:  After the interventions noted above I re-evaluated patient and found that they have :improved  Nausea much improved.  Pain completely under control now.  Social Determinants of Health:      Problem List / ED Course:  Thoracic aortic dissection type B.  Discussed with vascular surgery Dr. Lenell Antu.  Started esmolol and will admit to ICU.   Dispostion:  After consideration of the diagnostic results and the patients response to treatment, I feel that the patent would benefit from admission         Gailen Shelter, [place] 10/15/21 1920

## 2021-10-15 NOTE — ED Provider Triage Note (Signed)
Emergency Medicine Provider Triage Evaluation Note  [name] , a 56 y.o. female  was evaluated in triage.  Transferred from [facility]. Reports CP with EKG changes. N/V/D yesterday and today.  She is at [facility] for drug detox.  She reports left lateral chest pain starting today.  She has had this in the past "when my blood pressure is high".  Review of Systems  Positive: CP, N/V/D, 'mild' cough Negative: Leg swelling, SOB  Physical Exam  BP 120/85 (BP Location: Right Arm)   Pulse 98   Temp 99.2 F (37.3 C) (Oral)   Resp 16   SpO2 98%  Gen:   Awake, no distress   Resp:  Normal effort  MSK:   Moves extremities without difficulty  Other:  Systolic murmur heard, left lateral chest, borderline tachycardia  Medical Decision Making  Medically screening exam initiated at 1:26 PM.  Appropriate orders placed.  Thandiwe Siragusa was informed that the remainder of the evaluation will be completed by another provider, this initial triage assessment does not replace that evaluation, and the importance of remaining in the ED until their evaluation is complete.     Renne Crigler, PA-C 10/15/21 1333

## 2021-10-15 NOTE — ED Provider Notes (Signed)
FBC/OBS ASAP Discharge Summary  Date and Time: 10/15/2021 1:12 PM  Name: [name]  MRN:  [id number]   Discharge Diagnoses:  Final diagnoses:  Opioid dependence with withdrawal (HCC)  Polysubstance abuse (HCC)    Subjective:  Patient seen and chart reviewed-per chart review, patient reported chest pain EKG was obtained.  EKG revealed T wave abnormalities which are new.  On my assessment this morning, patient denied chest pain although did report nausea, diarrhea, abdominal pain, low energy, back pain.  Patient attributed symptoms primarily to opioid withdrawal symptoms.  She did not denies SI/HI/AVH.  Called EDP who suggested if no active chest pain can obtain EKG.  EKG was ordered and obtained-nursing staff reported reported left rib pain.  Decision was made to transfer patient to the ED for medical clearance for chest pain.  Stay Summary:  56 year old woman with history of reported depression and polysubstance abuse (cocaine and opiates,) who presented to the behavioral health urgent care on 10/12/2021 requesting assistance with detox from heroin ,cocaine, opioids.  Patient reported her last use was on 6/18.  Patient was admitted to the [facility] for detox and crisis stabilization.  UDS + cocaine, morphine; EtOH negative. Patient was initiated on opioid withdrawal protocol. On the morning of 6/22, patient reported chest pain and EKG obtained. EKG revealed T wave abnormalities  that suggested anterolateral ischemia. Patient denied CP to me although did report CP to nursing staff. Patient was transferred to the ED for medical clearance. Patient may return to the [facility] once medically cleared.    Total Time spent with patient: 30 minutes  Past Psychiatric History:  depression and polysubstance abuse Past Medical History:  Past Medical History:  Diagnosis Date   Carpal tunnel syndrome    Hypercholesteremia    Hypertension     Past Surgical History:  Procedure Laterality Date   CARPAL TUNNEL  RELEASE     CESAREAN SECTION     CHOLECYSTECTOMY     Family History:  Family History  Problem Relation Age of Onset   Hypertension Mother    Hypertension Father    Cancer Maternal Grandfather    Family Psychiatric History:  Denies psychiatric history Denies substance use history Denies history of attempted or completed suicides Social History:  Social History   Substance and Sexual Activity  Alcohol Use No     Social History   Substance and Sexual Activity  Drug Use Yes   Comment: Heroin use, last use 08/29/2018    Social History   Socioeconomic History   Marital status: Married    Spouse name: Not on file   Number of children: Not on file   Years of education: Not on file   Highest education level: Not on file  Occupational History   Not on file  Tobacco Use   Smoking status: Every Day    Packs/day: 0.30    Types: Cigarettes   Smokeless tobacco: Never  Substance and Sexual Activity   Alcohol use: No   Drug use: Yes    Comment: Heroin use, last use 08/29/2018   Sexual activity: Not Currently    Partners: Male    Birth control/protection: None    Comment: abstaining due to comfort  Other Topics Concern   Not on file  Social History Narrative   Not on file   Social Determinants of Health   Financial Resource Strain: Not on file  Food Insecurity: Not on file  Transportation Needs: Not on file  Physical Activity: Not on file  Stress: Not on file  Social Connections: Not on file   SDOH:  SDOH Screenings   Alcohol Screen: Not on file  Depression (PHQ2-9): Medium Risk (10/13/2021)   Depression (PHQ2-9)    PHQ-2 Score: 27  Financial Resource Strain: Not on file  Food Insecurity: Not on file  Housing: Not on file  Physical Activity: Not on file  Social Connections: Not on file  Stress: Not on file  Tobacco Use: Not on file  Transportation Needs: Not on file    Tobacco Cessation:  Prescription not provided because: transferred to ED for medical  clearance  Current Medications:  Current Facility-Administered Medications  Medication Dose Route Frequency Provider Last Rate Last Admin   acetaminophen (TYLENOL) tablet 650 mg  650 mg Oral Q6H PRN Oneta Rack, NP   650 mg at 10/14/21 0920   alum & mag hydroxide-simeth (MAALOX/MYLANTA) 200-200-20 MG/5ML suspension 30 mL  30 mL Oral Q4H PRN Oneta Rack, NP       aspirin EC tablet 81 mg  81 mg Oral Daily Estella Husk, MD   81 mg at 10/15/21 7425   cloNIDine (CATAPRES) tablet 0.1 mg  0.1 mg Oral Daily Estella Husk, MD   0.1 mg at 10/15/21 0631   dicyclomine (BENTYL) tablet 20 mg  20 mg Oral Q6H PRN Oneta Rack, NP   20 mg at 10/15/21 9563   hydrOXYzine (ATARAX) tablet 25 mg  25 mg Oral Q6H PRN Oneta Rack, NP   25 mg at 10/15/21 0923   [START ON 10/16/2021] lisinopril (ZESTRIL) tablet 20 mg  20 mg Oral Daily Estella Husk, MD       loperamide (IMODIUM) capsule 2-4 mg  2-4 mg Oral PRN Oneta Rack, NP       magnesium hydroxide (MILK OF MAGNESIA) suspension 30 mL  30 mL Oral Daily PRN Oneta Rack, NP       methocarbamol (ROBAXIN) tablet 500 mg  500 mg Oral Q8H PRN Oneta Rack, NP   500 mg at 10/15/21 0633   ondansetron (ZOFRAN-ODT) disintegrating tablet 4 mg  4 mg Oral Q6H PRN Oneta Rack, NP   4 mg at 10/15/21 1116   traZODone (DESYREL) tablet 50 mg  50 mg Oral QHS PRN Oneta Rack, NP   50 mg at 10/14/21 2116   Current Outpatient Medications  Medication Sig Dispense Refill   aspirin EC 81 MG tablet Take 81 mg by mouth daily. Swallow whole.     cloNIDine (CATAPRES) 0.1 MG tablet Take 0.1 mg by mouth daily.     Cyanocobalamin (VITAMIN B-12 PO) Take 1 tablet by mouth daily.     Ferrous Sulfate (IRON PO) Take 1 tablet by mouth daily.     lisinopril (ZESTRIL) 10 MG tablet Take 10 mg by mouth daily.     Multiple Vitamin (MULTIVITAMIN WITH MINERALS) TABS tablet Take 1 tablet by mouth daily.     Naproxen Sod-diphenhydrAMINE (ALEVE PM) 220-25 MG  TABS Take 2 tablets by mouth at bedtime as needed (For sleep or pain).      PTA Medications: (Not in a hospital admission)      10/13/2021   11:36 AM 08/04/2016   10:43 AM  Depression screen PHQ 2/9  Decreased Interest 3 3  Down, Depressed, Hopeless 3 2  PHQ - 2 Score 6 5  Altered sleeping 3 3  Tired, decreased energy 3 3  Change in appetite 3 3  Feeling bad or  failure about yourself  3 3  Trouble concentrating 3 3  Moving slowly or fidgety/restless 3 2  Suicidal thoughts 3 0  PHQ-9 Score 27 22    Flowsheet Row ED from 10/12/2021 in [facility]  C-SSRS RISK CATEGORY No Risk       Musculoskeletal  Strength & Muscle Tone:  did not formally assess; patient lying in bed Gait & Station:  did not formally assess; patient lying in bed Patient leans:  did not formally assess; patient lying in bed  Psychiatric Specialty Exam  Presentation  General Appearance: Appropriate for Environment; Casual  Eye Contact:Minimal  Speech:Clear and Coherent; Normal Rate  Speech Volume:Decreased  Handedness:Right   Mood and Affect  Mood:-- (dysphoric)  Affect:Appropriate; Congruent (irritable)   Thought Process  Thought Processes:Coherent; Goal Directed; Linear  Descriptions of Associations:Intact  Orientation:Full (Time, Place and Person)  Thought Content:Logical; WDL  Diagnosis of Schizophrenia or Schizoaffective disorder in past: No    Hallucinations:Hallucinations: None  Ideas of Reference:None  Suicidal Thoughts:Suicidal Thoughts: No  Homicidal Thoughts:Homicidal Thoughts: No   Sensorium  Memory:Recent Fair; Immediate Good; Remote Fair  Judgment:Fair  Insight:Fair   Executive Functions  Concentration:Fair  Attention Span:Fair  Recall:Fair  Fund of Knowledge:Good  Language:Good   Psychomotor Activity  Psychomotor Activity:Psychomotor Activity: Normal   Assets  Assets:Communication Skills; Desire for Improvement;  Resilience   Sleep  Sleep:Sleep: Fair   No data recorded  Physical Exam  Physical Exam Constitutional:      Appearance: She is normal weight.     Comments: Appears in moderate distress   HENT:     Head: Normocephalic and atraumatic.  Cardiovascular:     Rate and Rhythm: Normal rate and regular rhythm.     Heart sounds: Normal heart sounds.  Pulmonary:     Effort: Pulmonary effort is normal.  Abdominal:     General: Abdomen is flat.     Palpations: Abdomen is soft.  Neurological:     Mental Status: She is alert and oriented to person, place, and time.    Review of Systems  Constitutional:  Negative for chills and fever.       Fatigue   HENT:  Negative for hearing loss.   Eyes:  Negative for discharge and redness.  Respiratory:  Negative for cough.   Cardiovascular:  Negative for chest pain.  Gastrointestinal:  Positive for abdominal pain, diarrhea and nausea. Negative for vomiting.  Musculoskeletal:  Negative for myalgias.  Neurological:  Negative for headaches.  Psychiatric/Behavioral:  Positive for depression and substance abuse. Negative for suicidal ideas.    Blood pressure (!) 154/98, pulse 67, temperature 98.8 F (37.1 C), temperature source Oral, resp. rate 18, SpO2 100 %. There is no height or weight on file to calculate BMI.  Demographic Factors:  Low socioeconomic status and Unemployed  Loss Factors: Decrease in vocational status and Financial problems/change in socioeconomic status  Historical Factors: Substance use  Risk Reduction Factors:   Sense of responsibility to family and future oriented  Continued Clinical Symptoms:  Depression:   Comorbid alcohol abuse/dependence Alcohol/Substance Abuse/Dependencies Previous Psychiatric Diagnoses and Treatments  Cognitive Features That Contribute To Risk:  None    Suicide Risk:  Mild:  Suicidal ideation of limited frequency, intensity, duration, and specificity.  There are no identifiable plans, no  associated intent, mild dysphoria and related symptoms, good self-control (both objective and subjective assessment), few other risk factors, and identifiable protective factors, including available and accessible social support.  Plan  Of Care/Follow-up recommendations:  Transfer to the ED for medical clearance. May return to the [facility] once medically cleared  Disposition: ED  Estella Husk, MD 10/15/2021, 1:12 PM

## 2021-10-15 NOTE — Progress Notes (Signed)
eLink Physician-Brief Progress Note Patient Name: [name] DOB: [birth year] MRN: [id number]   Date of Service  10/15/2021  HPI/Events of Note  Patient is asking for a sleep aid.  eICU Interventions  Melatonin 3 mg po x 1 PRN ordered.        Migdalia Dk 10/15/2021, 11:23 PM

## 2021-10-15 NOTE — ED Notes (Signed)
Pt found to be smoking in the restroom. Encouraged not to smoke and ask for nicotine patch if needed.

## 2021-10-15 NOTE — Consult Note (Signed)
[facility]  ASSESSMENT / PLAN: 56 y.o. 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   Recommend admission to PCCM for blood pressure control. Target HR <80. Target SBP < 120. No evidence of malperfusion at the moment. Repeat CT angiogram of chest / abdomen / pelvis in 48 hours.  Should she develop worsening chest pain despite blood pressure control, severe acute abdominal pain, or lower extremity pain, recommend CT angiogram of the chest / abdomen / pelvis immediately.  Will follow closely.   CHIEF COMPLAINT: Chest pain  HISTORY OF PRESENT ILLNESS: [name] is a 56 y.o. female who presents to [facility], ER for evaluation of chest pain.  The patient reports several month history of chest pain which has been related to episodes of hypertension.  Patient is currently in recovery for substance abuse.  She reports she has been using cocaine and heroin and other opiates over the past 2 years.  She has lost about 60 pounds.  She attributes this to depression and homelessness.  The patient has no abdominal pain at this time.  She has no pain in her legs.  She reports no pain with walking.  Past Medical History:  Diagnosis Date   Carpal tunnel syndrome    Hypercholesteremia    Hypertension     Past Surgical History:  Procedure Laterality Date   CARPAL TUNNEL RELEASE     CESAREAN SECTION     CHOLECYSTECTOMY      Family History  Problem Relation Age of Onset   Hypertension Mother    Hypertension Father    Cancer Maternal Grandfather     Social History   Socioeconomic History   Marital status: Married    Spouse name: Not on file   Number of children: Not on file   Years of education: Not on file   Highest education level: Not on file  Occupational History   Not on file  Tobacco Use   Smoking status: Every Day    Packs/day: 0.30    Types: Cigarettes   Smokeless tobacco:  Never  Substance and Sexual Activity   Alcohol use: No   Drug use: Yes    Comment: Heroin use, last use 08/29/2018   Sexual activity: Not Currently    Partners: Male    Birth control/protection: None    Comment: abstaining due to comfort  Other Topics Concern   Not on file  Social History Narrative   Not on file   Social Determinants of Health   Financial Resource Strain: Not on file  Food Insecurity: Not on file  Transportation Needs: Not on file  Physical Activity: Not on file  Stress: Not on file  Social Connections: Not on file  Intimate Partner Violence: Not on file    Allergies  Allergen Reactions   Ibuprofen Other (See Comments)    Blood in stool   Benadryl [Diphenhydramine] Anxiety   Sulfa Antibiotics Itching    Current Facility-Administered Medications  Medication Dose Route Frequency Provider Last Rate Last Admin   clevidipine (CLEVIPREX) 0.5 MG/ML infusion            clevidipine (CLEVIPREX) infusion 0.5 mg/mL  0-21 mg/hr Intravenous Continuous Leonie Douglas, MD       esmolol (BREVIBLOC) 2000 mg / 100 mL (20 mg/mL) infusion  25-300 mcg/kg/min (Order-Specific) Intravenous Continuous Terald Sleeper, MD   Held at 10/15/21 1823   fentaNYL (SUBLIMAZE) injection 50 mcg  50 mcg Intravenous Q2H PRN Gailen Shelter, [place]       Current Outpatient Medications  Medication Sig Dispense Refill   acetaminophen (TYLENOL) 650 MG CR tablet Take 650 mg by mouth every 6 (six) hours as needed for pain.     alum & mag hydroxide-simeth (MAALOX/MYLANTA) 200-200-20 MG/5ML suspension Take 30 mLs by mouth every 4 (four) hours as needed for indigestion.     aspirin EC 81 MG tablet Take 81 mg by mouth daily. Swallow whole.     cloNIDine (CATAPRES) 0.1 MG tablet Take 0.1 mg by mouth daily.     Cyanocobalamin (VITAMIN B-12 PO) Take 1 tablet by mouth daily.     dicyclomine (BENTYL) 20 MG tablet Take 20 mg by mouth every 6 (six) hours as needed for spasms.     hydrOXYzine (ATARAX) 25 MG  tablet Take 25 mg by mouth every 6 (six) hours as needed for anxiety.     lisinopril (ZESTRIL) 10 MG tablet Take 10 mg by mouth daily.     loperamide (IMODIUM) 2 MG capsule Take 2-4 mg by mouth as needed for diarrhea or loose stools.     Magnesium Hydroxide (MILK OF MAGNESIA PO) Take 30 mLs by mouth daily as needed (constipation).     methocarbamol (ROBAXIN) 500 MG tablet Take 500 mg by mouth every 8 (eight) hours as needed for muscle spasms.     Multiple Vitamin (MULTIVITAMIN WITH MINERALS) TABS tablet Take 1 tablet by mouth daily.     Naproxen Sod-diphenhydrAMINE (ALEVE PM) 220-25 MG TABS Take 2 tablets by mouth at bedtime as needed (For sleep or pain).     ondansetron (ZOFRAN-ODT) 4 MG disintegrating tablet Take 4 mg by mouth every 6 (six) hours as needed for nausea or vomiting.     PRESCRIPTION MEDICATION Inject 4 mg into the skin once. Ondansetron 4 mg injection     Probiotic Product (PROBIOTIC PO) Take 1 tablet by mouth daily.     traZODone (DESYREL) 50 MG tablet Take 50 mg by mouth at bedtime as needed for sleep.     Ferrous Sulfate (IRON PO) Take 1 tablet by mouth daily.     hydrochlorothiazide (HYDRODIURIL) 25 MG tablet Take 25 mg by mouth daily. (Patient not taking: Reported on 10/15/2021)     lisinopril (ZESTRIL) 20 MG tablet Take 20 mg by mouth daily. (Patient not taking: Reported on 10/15/2021)      PHYSICAL EXAM Vitals:   10/15/21 1800 10/15/21 1818 10/15/21 1819 10/15/21 1822  BP: (!) 168/94     Pulse: 70 67 (!) 58 67  Resp: (!) 29 (!) 21 (!) 27 13  Temp:      TempSrc:      SpO2: 100% 100% 100% 100%   Constitutional: Cachectic.  Tearful. Cardiac: regular rate and rhythm.  Respiratory:  unlabored. Abdominal:  soft, non-tender, non-distended.  Peripheral vascular: 1+ femoral pulses bilaterally  PERTINENT LABORATORY AND RADIOLOGIC DATA  Most recent CBC    Latest Ref Rng & Units 10/15/2021    1:32 PM 10/12/2021   12:34 PM 08/30/2018    3:29 PM  CBC  WBC 4.0 - 10.5 K/uL  7.2  4.3  7.2   Hemoglobin 12.0 - 15.0 g/dL 18.8  41.6  60.6   Hematocrit 36.0 - 46.0 % 44.0  36.8  38.0   Platelets 150 - 400 K/uL 303  246  350      Most recent CMP    Latest Ref Rng & Units 10/15/2021  1:32 PM 10/12/2021   12:34 PM 08/30/2018    3:29 PM  CMP  Glucose 70 - 99 mg/dL 258  527  99   BUN 6 - 20 mg/dL 22  18  6    Creatinine 0.44 - 1.00 mg/dL  7.82  4.23   Sodium 135 - 145 mmol/L 137  141  137   Potassium 3.5 - 5.1 mmol/L 3.8  4.5  3.4   Chloride 98 - 111 mmol/L 99  105  98   CO2 22 - 32 mmol/L 27  29  21    Calcium 8.9 - 10.3 mg/dL 9.3  9.4  9.5   Total Protein 6.5 - 8.1 g/dL 6.4  5.6  7.5   Total Bilirubin 0.3 - 1.2 mg/dL 0.9  0.4  0.7   Alkaline Phos 38 - 126 U/L 52  46  71   AST 15 - 41 U/L 22  22  25    ALT 0 - 44 U/L 20  22  23     CT angiogram of the chest shows type B aortic dissection with thrombosed false lumen.  The dissection is incompletely studied.  We are able to see the celiac axis opacify from the true lumen.  5.36. , MD [facility] Phone Number: [phone] 10/15/2021 6:38 PM  Total time spent on preparing this encounter including chart review, data review, collecting history, examining the patient, coordinating care for this new patient, 60 minutes.  Portions of this report may have been transcribed using voice recognition software.  Every effort has been made to ensure accuracy; however, inadvertent computerized transcription errors may still be present.

## 2021-10-16 ENCOUNTER — Inpatient Hospital Stay (HOSPITAL_COMMUNITY): Payer: Medicaid Other

## 2021-10-16 DIAGNOSIS — I71019 Dissection of thoracic aorta, unspecified: Secondary | ICD-10-CM | POA: Diagnosis not present

## 2021-10-16 DIAGNOSIS — N179 Acute kidney failure, unspecified: Secondary | ICD-10-CM

## 2021-10-16 DIAGNOSIS — R079 Chest pain, unspecified: Secondary | ICD-10-CM | POA: Diagnosis not present

## 2021-10-16 DIAGNOSIS — I71012 Dissection of descending thoracic aorta: Secondary | ICD-10-CM | POA: Diagnosis not present

## 2021-10-16 DIAGNOSIS — I169 Hypertensive crisis, unspecified: Secondary | ICD-10-CM

## 2021-10-16 DIAGNOSIS — F1193 Opioid use, unspecified with withdrawal: Secondary | ICD-10-CM

## 2021-10-16 DIAGNOSIS — E7800 Pure hypercholesterolemia, unspecified: Secondary | ICD-10-CM

## 2021-10-16 LAB — BASIC METABOLIC PANEL
Anion gap: 9 (ref 5–15)
BUN: 18 mg/dL (ref 6–20)
CO2: 25 mmol/L (ref 22–32)
Calcium: 8.7 mg/dL — ABNORMAL LOW (ref 8.9–10.3)
Chloride: 104 mmol/L (ref 98–111)
Creatinine, Ser: 1.23 mg/dL — ABNORMAL HIGH (ref 0.44–1.00)
GFR, Estimated: 52 mL/min — ABNORMAL LOW (ref >60)
Glucose, Bld: 130 mg/dL — ABNORMAL HIGH (ref 70–99)
Potassium: 3.4 mmol/L — ABNORMAL LOW (ref 3.5–5.1)
Sodium: 138 mmol/L (ref 135–145)

## 2021-10-16 LAB — CBC
HCT: 40.1 % (ref 36.0–46.0)
Hemoglobin: 13.6 g/dL (ref 12.0–15.0)
MCH: 28.6 pg (ref 26.0–34.0)
MCHC: 33.9 g/dL (ref 30.0–36.0)
MCV: 84.4 fL (ref 80.0–100.0)
Platelets: 263 10*3/uL (ref 150–400)
RBC: 4.75 MIL/uL (ref 3.87–5.11)
RDW: 13.3 % (ref 11.5–15.5)
WBC: 7.6 10*3/uL (ref 4.0–10.5)
nRBC: 0 % (ref 0.0–0.2)

## 2021-10-16 LAB — ECHOCARDIOGRAM COMPLETE
AR max vel: 2.7 cm2
AV Peak grad: 10.3 mmHg
Ao pk vel: 1.61 m/s
Area-P 1/2: 3.33 cm2
Height: 63 in
S' Lateral: 1.1 cm
Weight: 1657.86 oz

## 2021-10-16 LAB — HIV ANTIBODY (ROUTINE TESTING W REFLEX): HIV Screen 4th Generation wRfx: NONREACTIVE

## 2021-10-16 LAB — PHOSPHORUS: Phosphorus: 3.2 mg/dL (ref 2.5–4.6)

## 2021-10-16 LAB — MAGNESIUM: Magnesium: 2.2 mg/dL (ref 1.7–2.4)

## 2021-10-16 MED ORDER — LOPERAMIDE HCL 2 MG PO CAPS: 2.0000 mg | ORAL_CAPSULE | ORAL | Status: AC | PRN

## 2021-10-16 MED ORDER — CLONIDINE HCL 0.1 MG PO TABS
0.1000 mg | ORAL_TABLET | Freq: Three times a day (TID) | ORAL | Status: DC
  Administered 2021-10-16 (×3): 0.1 mg via ORAL
  Filled 2021-10-16 (×3): qty 1

## 2021-10-16 MED ORDER — ADULT MULTIVITAMIN W/MINERALS CH
1.0000 | ORAL_TABLET | Freq: Every day | ORAL | Status: DC
Start: 2021-10-16 — End: 2021-10-21
  Administered 2021-10-16 – 2021-10-21 (×6): 1 via ORAL
  Filled 2021-10-16 (×6): qty 1

## 2021-10-16 MED ORDER — CHLORDIAZEPOXIDE HCL 5 MG PO CAPS
25.0000 mg | ORAL_CAPSULE | Freq: Every day | ORAL | Status: AC
  Administered 2021-10-19: 25 mg via ORAL
  Filled 2021-10-16: qty 5

## 2021-10-16 MED ORDER — HYDROXYZINE HCL 25 MG PO TABS
25.0000 mg | ORAL_TABLET | Freq: Four times a day (QID) | ORAL | Status: AC | PRN
  Administered 2021-10-16 – 2021-10-19 (×7): 25 mg via ORAL
  Filled 2021-10-16 (×7): qty 1

## 2021-10-16 MED ORDER — ONDANSETRON HCL 4 MG/2ML IJ SOLN
4.0000 mg | Freq: Four times a day (QID) | INTRAMUSCULAR | Status: AC | PRN
  Administered 2021-10-16 (×2): 4 mg via INTRAVENOUS
  Filled 2021-10-16 (×2): qty 2

## 2021-10-16 MED ORDER — CHLORDIAZEPOXIDE HCL 5 MG PO CAPS
25.0000 mg | ORAL_CAPSULE | ORAL | Status: AC
  Administered 2021-10-18 (×2): 25 mg via ORAL
  Filled 2021-10-16: qty 1
  Filled 2021-10-16: qty 5

## 2021-10-16 MED ORDER — THIAMINE HCL 100 MG/ML IJ SOLN
100.0000 mg | Freq: Once | INTRAMUSCULAR | Status: AC
Start: 2021-10-16 — End: 2021-10-16
  Administered 2021-10-16: 100 mg via INTRAMUSCULAR
  Filled 2021-10-16: qty 2

## 2021-10-16 MED ORDER — ONDANSETRON HCL 4 MG/2ML IJ SOLN
4.0000 mg | Freq: Three times a day (TID) | INTRAMUSCULAR | Status: DC | PRN
  Administered 2021-10-16 – 2021-10-21 (×11): 4 mg via INTRAVENOUS
  Filled 2021-10-16 (×11): qty 2

## 2021-10-16 MED ORDER — CHLORDIAZEPOXIDE HCL 25 MG PO CAPS
25.0000 mg | ORAL_CAPSULE | Freq: Four times a day (QID) | ORAL | Status: AC
  Administered 2021-10-16 (×4): 25 mg via ORAL
  Filled 2021-10-16 (×4): qty 1

## 2021-10-16 MED ORDER — CHLORDIAZEPOXIDE HCL 25 MG PO CAPS: 25.0000 mg | ORAL_CAPSULE | Freq: Once | ORAL | Status: DC

## 2021-10-16 MED ORDER — CHLORDIAZEPOXIDE HCL 5 MG PO CAPS
25.0000 mg | ORAL_CAPSULE | Freq: Four times a day (QID) | ORAL | Status: AC | PRN
Start: 2021-10-16 — End: 2021-10-19

## 2021-10-16 MED ORDER — POTASSIUM CHLORIDE CRYS ER 20 MEQ PO TBCR
40.0000 meq | EXTENDED_RELEASE_TABLET | Freq: Once | ORAL | Status: AC
  Administered 2021-10-16: 40 meq via ORAL
  Filled 2021-10-16: qty 2

## 2021-10-16 MED ORDER — ONDANSETRON 4 MG PO TBDP
4.0000 mg | ORAL_TABLET | Freq: Four times a day (QID) | ORAL | Status: AC | PRN
  Administered 2021-10-16 – 2021-10-17 (×2): 4 mg via ORAL
  Filled 2021-10-16 (×3): qty 1

## 2021-10-16 MED ORDER — THIAMINE HCL 100 MG PO TABS
100.0000 mg | ORAL_TABLET | Freq: Every day | ORAL | Status: DC
Start: 2021-10-17 — End: 2021-10-21
  Administered 2021-10-17 – 2021-10-21 (×5): 100 mg via ORAL
  Filled 2021-10-16 (×5): qty 1

## 2021-10-16 MED ORDER — LABETALOL HCL 200 MG PO TABS
200.0000 mg | ORAL_TABLET | Freq: Three times a day (TID) | ORAL | Status: DC
  Administered 2021-10-16 (×2): 200 mg via ORAL
  Filled 2021-10-16 (×3): qty 1

## 2021-10-16 MED ORDER — CHLORDIAZEPOXIDE HCL 25 MG PO CAPS
25.0000 mg | ORAL_CAPSULE | Freq: Three times a day (TID) | ORAL | Status: AC
  Administered 2021-10-17 (×3): 25 mg via ORAL
  Filled 2021-10-16 (×3): qty 1

## 2021-10-16 NOTE — Progress Notes (Signed)
eLink Physician-Brief Progress Note Patient Name: [name] DOB: 1965-12-06 MRN: [id number]   Date of Service  10/16/2021  HPI/Events of Note  Patient complaining of nausea. QTC 0.45.  eICU Interventions  Zofran ordered.        Thomasene Lot Rawson Minix 10/16/2021, 2:57 AM

## 2021-10-17 ENCOUNTER — Inpatient Hospital Stay (HOSPITAL_COMMUNITY): Payer: Medicaid Other

## 2021-10-17 DIAGNOSIS — I71012 Dissection of descending thoracic aorta: Secondary | ICD-10-CM | POA: Diagnosis not present

## 2021-10-17 DIAGNOSIS — R079 Chest pain, unspecified: Secondary | ICD-10-CM | POA: Diagnosis not present

## 2021-10-17 DIAGNOSIS — F1193 Opioid use, unspecified with withdrawal: Secondary | ICD-10-CM

## 2021-10-17 DIAGNOSIS — I169 Hypertensive crisis, unspecified: Secondary | ICD-10-CM

## 2021-10-17 DIAGNOSIS — I71019 Dissection of thoracic aorta, unspecified: Secondary | ICD-10-CM | POA: Diagnosis not present

## 2021-10-17 DIAGNOSIS — N179 Acute kidney failure, unspecified: Secondary | ICD-10-CM | POA: Diagnosis not present

## 2021-10-17 LAB — BASIC METABOLIC PANEL
Anion gap: 6 (ref 5–15)
BUN: 13 mg/dL (ref 6–20)
CO2: 23 mmol/L (ref 22–32)
Calcium: 8.5 mg/dL — ABNORMAL LOW (ref 8.9–10.3)
Chloride: 112 mmol/L — ABNORMAL HIGH (ref 98–111)
Creatinine, Ser: 1.22 mg/dL — ABNORMAL HIGH (ref 0.44–1.00)
GFR, Estimated: 53 mL/min — ABNORMAL LOW (ref >60)
Glucose, Bld: 100 mg/dL — ABNORMAL HIGH (ref 70–99)
Potassium: 3.7 mmol/L (ref 3.5–5.1)
Sodium: 141 mmol/L (ref 135–145)

## 2021-10-17 LAB — CBC WITH DIFFERENTIAL/PLATELET
Abs Immature Granulocytes: 0.01 10*3/uL (ref 0.00–0.07)
Basophils Absolute: 0 10*3/uL (ref 0.0–0.1)
Basophils Relative: 0 %
Eosinophils Absolute: 0 10*3/uL (ref 0.0–0.5)
Eosinophils Relative: 0 %
HCT: 34.2 % — ABNORMAL LOW (ref 36.0–46.0)
Hemoglobin: 11.6 g/dL — ABNORMAL LOW (ref 12.0–15.0)
Immature Granulocytes: 0 %
Lymphocytes Relative: 37 %
Lymphs Abs: 1.8 10*3/uL (ref 0.7–4.0)
MCH: 28.7 pg (ref 26.0–34.0)
MCHC: 33.9 g/dL (ref 30.0–36.0)
MCV: 84.7 fL (ref 80.0–100.0)
Monocytes Absolute: 0.3 10*3/uL (ref 0.1–1.0)
Monocytes Relative: 7 %
Neutro Abs: 2.8 10*3/uL (ref 1.7–7.7)
Neutrophils Relative %: 56 %
Platelets: 199 10*3/uL (ref 150–400)
RBC: 4.04 MIL/uL (ref 3.87–5.11)
RDW: 13.6 % (ref 11.5–15.5)
WBC: 5 10*3/uL (ref 4.0–10.5)
nRBC: 0 % (ref 0.0–0.2)

## 2021-10-17 LAB — TRIGLYCERIDES: Triglycerides: 91 mg/dL (ref <150)

## 2021-10-17 MED ORDER — OXYCODONE HCL 5 MG PO TABS
5.0000 mg | ORAL_TABLET | ORAL | Status: DC | PRN
  Administered 2021-10-17: 5 mg via ORAL
  Administered 2021-10-17: 10 mg via ORAL
  Administered 2021-10-17 – 2021-10-18 (×5): 5 mg via ORAL
  Administered 2021-10-19 (×3): 10 mg via ORAL
  Administered 2021-10-19 (×2): 5 mg via ORAL
  Administered 2021-10-20 – 2021-10-21 (×8): 10 mg via ORAL
  Filled 2021-10-17: qty 2
  Filled 2021-10-17 (×2): qty 1
  Filled 2021-10-17 (×2): qty 2
  Filled 2021-10-17: qty 1
  Filled 2021-10-17: qty 2
  Filled 2021-10-17: qty 1
  Filled 2021-10-17: qty 2
  Filled 2021-10-17 (×2): qty 1
  Filled 2021-10-17 (×3): qty 2
  Filled 2021-10-17: qty 1
  Filled 2021-10-17 (×3): qty 2
  Filled 2021-10-17: qty 1
  Filled 2021-10-17 (×2): qty 2

## 2021-10-17 MED ORDER — CLONIDINE HCL 0.2 MG PO TABS
0.2000 mg | ORAL_TABLET | Freq: Three times a day (TID) | ORAL | Status: DC
  Administered 2021-10-17 – 2021-10-21 (×13): 0.2 mg via ORAL
  Filled 2021-10-17 (×2): qty 1
  Filled 2021-10-17: qty 2
  Filled 2021-10-17 (×2): qty 1
  Filled 2021-10-17 (×2): qty 2
  Filled 2021-10-17: qty 1
  Filled 2021-10-17: qty 2
  Filled 2021-10-17 (×2): qty 1
  Filled 2021-10-17: qty 2
  Filled 2021-10-17: qty 1

## 2021-10-17 MED ORDER — OXYCODONE HCL 5 MG PO TABS
5.0000 mg | ORAL_TABLET | Freq: Four times a day (QID) | ORAL | Status: DC | PRN
  Administered 2021-10-17: 5 mg via ORAL
  Filled 2021-10-17: qty 1

## 2021-10-17 MED ORDER — IOHEXOL 350 MG/ML SOLN
100.0000 mL | Freq: Once | INTRAVENOUS | Status: AC | PRN
  Administered 2021-10-17: 100 mL via INTRAVENOUS

## 2021-10-17 MED ORDER — LABETALOL HCL 200 MG PO TABS
300.0000 mg | ORAL_TABLET | Freq: Three times a day (TID) | ORAL | Status: DC
  Administered 2021-10-17 – 2021-10-21 (×13): 300 mg via ORAL
  Filled 2021-10-17 (×14): qty 2

## 2021-10-17 MED ORDER — POTASSIUM CHLORIDE CRYS ER 20 MEQ PO TBCR
40.0000 meq | EXTENDED_RELEASE_TABLET | Freq: Once | ORAL | Status: AC
  Administered 2021-10-17: 40 meq via ORAL
  Filled 2021-10-17: qty 2

## 2021-10-17 NOTE — Progress Notes (Signed)
NAME:  [name], MRN:  [id number], DOB:  Jan 01, 1966, LOS: 2 ADMISSION DATE:  10/15/2021, CONSULTATION DATE:  10/17/2021  REFERRING MD:  ED , Blanchie Dessert, CHIEF COMPLAINT:  chest pain   History of Present Illness:  56 year old woman presented voluntarily to behavioral health urgent care 6/19 for treatment of opiate addiction.  She developed chest pain 6/21 and was brought in by EMS to ED for evaluation of "EKG changes".  Given aspirin and sublingual nitro on route.  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  True lumen was 20% She was treated with IV labetalol and IV esmolol and Cleviprex was started. PCCM consulted UDS positive for cocaine and morphine on 6/19  Pertinent  Medical History  Hypertension Polysubstance abuse -cocaine, heroin, opiates, tobacco abuse Depression Homelessness  Significant Hospital Events: Including procedures, antibiotic start and stop dates in addition to other pertinent events   6/22 admitted for aortic dissection started on esmolol and cleviprex goal BP <120 goal HR 70-80 6/23 adding oral labetolol and increasing clonidine (for BP and narc w/d) added librium taper for wd   Interim History / Subjective:  Patient continued complaint of left-sided chest pain radiating down to abdomen Blood pressure remains uncontrolled, it went up to 160 Clevidipine and esmolol are off    Objective   Blood pressure (!) 163/98, pulse (!) 57, temperature 98 F (36.7 C), temperature source Oral, resp. rate (!) 32, height 5\' 3"  (1.6 m), weight 46.2 kg, SpO2 100 %.        Intake/Output Summary (Last 24 hours) at 10/17/2021 1308 Last data filed at 10/17/2021 0400 Gross per 24 hour  Intake 2413.43 ml  Output 1300 ml  Net 1113.43 ml   Filed Weights   10/16/21 0028 10/16/21 0500 10/17/21  0600  Weight: 47 kg 47 kg 46.2 kg    Examination: Physical exam: General: Acute on chronically ill-appearing female, lying on the bed HEENT: [place]/AT, eyes anicteric.  moist mucus membranes Neuro: Alert, awake following commands Chest: Coarse breath sounds, no wheezes or rhonchi Heart: Regular rate and rhythm, no murmurs or gallops Abdomen: Soft, nontender, nondistended, bowel sounds present Skin: No rash   Resolved Hospital Problem list     Assessment & Plan:  Type B aortic dissection , contributing factors uncontrolled hypertension and cocaine abuse Hypertensive emergency Patient blood pressure remains uncontrolled Clevidipine and esmolol was stopped last night Current SBP goal < 120; HR 70-80 goal (no higher than 80) Appreciate vascular surgery input Patient will have repeat CT abd/pelvis today (allowing for 48hrs from IV contrast) to eval extent of dissection into abd aorta Increase clonidine to 0.2 mg 3 times daily Increase labetalol to 300 mg 3 times daily  AKI, likely due to uncontrolled hypertension Encourage oral fluid intake Monitor intake and output Avoid nephrotoxic agent Serum creatinine has stabilized around 1.2  Hypokalemia Corrected to 3.7 Received 40 mEq this morning  Polysubstance abuse w/ evidence of w/d->she feels anxious and tremulous  Urine tox was positive for opiate and cocaine Counseling provided regarding substance quit Continue librium taper   Best Practice (right click and "Reselect all SmartList Selections" daily)   Diet/type: Regular consistency (see orders) DVT prophylaxis:  GI prophylaxis: N/A Lines: N/A Foley:  N/A Code Status:  full code Last date of multidisciplinary goals of care discussion [6/24: With patient at bedside, she would  like to continue full scope of care]   Critical care time:     Total critical care time: 34 minutes  Performed by: Cheri Fowler   Critical care time was exclusive of separately billable  procedures and treating other patients.   Critical care was necessary to treat or prevent imminent or life-threatening deterioration.   Critical care was time spent personally by me on the following activities: development of treatment plan with patient and/or surrogate as well as nursing, discussions with consultants, evaluation of patient's response to treatment, examination of patient, obtaining history from patient or surrogate, ordering and performing treatments and interventions, ordering and review of laboratory studies, ordering and review of radiographic studies, pulse oximetry and re-evaluation of patient's condition.   Cheri Fowler, MD [facility] See Amion for pager If no response to pager, please call [phone] until 7pm After 7pm, Please call E-link [phone]

## 2021-10-18 DIAGNOSIS — I71012 Dissection of descending thoracic aorta: Secondary | ICD-10-CM | POA: Diagnosis not present

## 2021-10-18 DIAGNOSIS — R079 Chest pain, unspecified: Secondary | ICD-10-CM | POA: Diagnosis not present

## 2021-10-18 DIAGNOSIS — I71019 Dissection of thoracic aorta, unspecified: Secondary | ICD-10-CM | POA: Diagnosis not present

## 2021-10-18 DIAGNOSIS — N179 Acute kidney failure, unspecified: Secondary | ICD-10-CM | POA: Diagnosis not present

## 2021-10-18 DIAGNOSIS — I169 Hypertensive crisis, unspecified: Secondary | ICD-10-CM | POA: Diagnosis not present

## 2021-10-18 LAB — BASIC METABOLIC PANEL
Anion gap: 5 (ref 5–15)
BUN: 9 mg/dL (ref 6–20)
CO2: 26 mmol/L (ref 22–32)
Calcium: 8.5 mg/dL — ABNORMAL LOW (ref 8.9–10.3)
Chloride: 111 mmol/L (ref 98–111)
Creatinine, Ser: 1.42 mg/dL — ABNORMAL HIGH (ref 0.44–1.00)
GFR, Estimated: 44 mL/min — ABNORMAL LOW (ref >60)
Glucose, Bld: 80 mg/dL (ref 70–99)
Potassium: 3.6 mmol/L (ref 3.5–5.1)
Sodium: 142 mmol/L (ref 135–145)

## 2021-10-18 LAB — PHOSPHORUS: Phosphorus: 2.5 mg/dL (ref 2.5–4.6)

## 2021-10-18 LAB — MAGNESIUM: Magnesium: 2 mg/dL (ref 1.7–2.4)

## 2021-10-19 DIAGNOSIS — N179 Acute kidney failure, unspecified: Secondary | ICD-10-CM | POA: Diagnosis not present

## 2021-10-19 DIAGNOSIS — E7800 Pure hypercholesterolemia, unspecified: Secondary | ICD-10-CM

## 2021-10-19 DIAGNOSIS — I71012 Dissection of descending thoracic aorta: Secondary | ICD-10-CM | POA: Diagnosis not present

## 2021-10-19 DIAGNOSIS — I169 Hypertensive crisis, unspecified: Secondary | ICD-10-CM | POA: Diagnosis not present

## 2021-10-19 DIAGNOSIS — E876 Hypokalemia: Secondary | ICD-10-CM

## 2021-10-19 DIAGNOSIS — I71019 Dissection of thoracic aorta, unspecified: Secondary | ICD-10-CM | POA: Diagnosis not present

## 2021-10-19 LAB — BASIC METABOLIC PANEL
Anion gap: 6 (ref 5–15)
BUN: 9 mg/dL (ref 6–20)
CO2: 26 mmol/L (ref 22–32)
Calcium: 8.5 mg/dL — ABNORMAL LOW (ref 8.9–10.3)
Chloride: 109 mmol/L (ref 98–111)
Creatinine, Ser: 1.47 mg/dL — ABNORMAL HIGH (ref 0.44–1.00)
GFR, Estimated: 42 mL/min — ABNORMAL LOW (ref >60)
Glucose, Bld: 90 mg/dL (ref 70–99)
Potassium: 3.5 mmol/L (ref 3.5–5.1)
Sodium: 141 mmol/L (ref 135–145)

## 2021-10-19 NOTE — Progress Notes (Addendum)
Progress Note    10/19/2021 8:00 AM * No surgery found *  Subjective:  no complaints. Says overall feeling better   Vitals:   10/19/21 0002 10/19/21 0436  BP: 102/63 125/73  Pulse: 69 66  Resp: 18 18  Temp: 98.2 F (36.8 C) 98.2 F (36.8 C)  SpO2: 100% 100%   Physical Exam: Cardiac:  regular Lungs:  non labored Extremities:  well perfused and warm. 2+ radial pulses, 2+ femoral pulses, 2+ DP pulses bilaterally. Motor and sensation intact Abdomen:  soft, flat, non distended, non tender Neurologic: alert and oriented  CBC    Component Value Date/Time   WBC 5.0 10/17/2021 0319   RBC 4.04 10/17/2021 0319   HGB 11.6 (L) 10/17/2021 0319   HGB 11.3 08/04/2016 1130   HCT 34.2 (L) 10/17/2021 0319   HCT 36.7 08/04/2016 1130   PLT 199 10/17/2021 0319   PLT 265 08/04/2016 1130   MCV 84.7 10/17/2021 0319   MCV 88 08/04/2016 1130   MCH 28.7 10/17/2021 0319   MCHC 33.9 10/17/2021 0319   RDW 13.6 10/17/2021 0319   RDW 16.5 (H) 08/04/2016 1130   LYMPHSABS 1.8 10/17/2021 0319   LYMPHSABS 1.9 08/04/2016 1130   MONOABS 0.3 10/17/2021 0319   EOSABS 0.0 10/17/2021 0319   EOSABS 0.0 08/04/2016 1130   BASOSABS 0.0 10/17/2021 0319   BASOSABS 0.0 08/04/2016 1130    BMET    Component Value Date/Time   NA 142 10/18/2021 0935   NA 144 08/04/2016 1130   K 3.6 10/18/2021 0935   CL 111 10/18/2021 0935   CO2 26 10/18/2021 0935   GLUCOSE 80 10/18/2021 0935   BUN 9 10/18/2021 0935   BUN 11 08/04/2016 1130   CREATININE 1.42 (H) 10/18/2021 0935   CALCIUM 8.5 (L) 10/18/2021 0935   GFRNONAA 44 (L) 10/18/2021 0935   GFRAA >60 08/30/2018 1529    INR    Component Value Date/Time   INR 1.0 08/04/2016 1130    No intake or output data in the 24 hours ending 10/19/21 0800   Assessment/Plan:  56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 Palpable Radial, femoral and DP pulses bilaterally Will arrange  outpatient follow up in 1 month   Graceann Congress, PA-C [facility] [phone] 10/19/2021 8:00 AM  VASCULAR STAFF ADDENDUM: I have independently interviewed and examined the patient. I agree with the above.   CT scan shows highly complex dissection anatomy. She does have early aneurysmal degeneration in the descending thoracic aorta.  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 There is a retrograde focal dissection flap in the arch of the aorta that is stable. The dissection extends through the iliac bifurcation. While the CT scan suggests no flow in the iliac arteries, her clinical exam is not consistent with this; suspect "dynamic" flap in the iliac vessels.   Surgical repair would require left carotid-subclavian bypass; TEVAR with side branch into the innominate / left carotid origin. Given her small iliac vessels, she would likely need retroperitoneal exposure of the right iliac arteries and creation of a vascular conduit to deliver the endoprosthesis.   An alternative would be complete arch debranching with CTS and delivery of a zone 0 TEVAR.   Either repair would carry high perioperative risk.   Patient counseled that continued cocaine use will worsen this dissection and  possibly cause this to rupture. Rupture would be fatal.   Follow up with me in 1 month with CT angiogram of chest, abdomen, pelvis.   Please call for questions.  Rande Brunt. Lenell Antu, MD [facility] Phone Number: [phone] 10/19/2021 9:31 AM

## 2021-10-20 ENCOUNTER — Other Ambulatory Visit: Payer: Self-pay

## 2021-10-20 ENCOUNTER — Encounter (HOSPITAL_COMMUNITY): Payer: Self-pay

## 2021-10-20 DIAGNOSIS — E7800 Pure hypercholesterolemia, unspecified: Secondary | ICD-10-CM | POA: Diagnosis not present

## 2021-10-20 DIAGNOSIS — N179 Acute kidney failure, unspecified: Secondary | ICD-10-CM | POA: Diagnosis not present

## 2021-10-20 DIAGNOSIS — I71012 Dissection of descending thoracic aorta: Secondary | ICD-10-CM | POA: Diagnosis not present

## 2021-10-20 DIAGNOSIS — I169 Hypertensive crisis, unspecified: Secondary | ICD-10-CM | POA: Diagnosis not present

## 2021-10-20 MED ORDER — HYDROXYZINE HCL 25 MG PO TABS
25.0000 mg | ORAL_TABLET | Freq: Three times a day (TID) | ORAL | Status: DC | PRN
  Administered 2021-10-20 – 2021-10-21 (×3): 25 mg via ORAL
  Filled 2021-10-20 (×3): qty 1

## 2021-10-20 NOTE — Progress Notes (Signed)
PROGRESS NOTE    [name]  DGL:[id number] DOB: 1965/05/09 DOA: 10/15/2021 PCP: Loletta Specter, PA-C   Brief Narrative: [name] is a 56 y.o. female with a history of hypertension, polysubstance abuse, depression, homelessness. Patient presented from voluntary admission to behavioral health, for treatment of opiate addiction, secondary to chest pain. 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 Patient managed conservatively with tight blood pressure control and improvement of symptoms.   Assessment and Plan:  Type B aortic dissection Extending to left external iliac, internal iliac and femoral arteries. Secondary to hypertensive emergency. Patient admitted to the ICU. Vascular surgery consulted. Patient managed conservatively with tight blood pressure control. Chest pain improved with management. Vascular surgery recommending outpatient follow-up. Patient was started on narcotics while inpatient for medical admission. -Continue oxycodone PRN for pain, but patient understands we may need to adjust this pending the rehab facility requirements  Hypertensive emergency Patient started on Cleviprex and esmolol with a target  SBP <120/HR <80. Patient with improved blood pressure control and she was transitioned to labetalol and clonidine. -Continue labetalol and clonidine  AKI Appears to possibly be CKD stage IIIa rather than AKI. Creatinine stable.  Hypokalemia Mild. Resolved with repletion.  Polysubstance abuse Patient is hoping for rehabilitation services.  Anxiety -hydroxyzine PRN  DVT prophylaxis: Heparin subq Code Status:   Code Status: Full Code Family Communication: None at bedside Disposition Plan: Discharge to drug rehab facility tomorrow as that is the earliest they can take her.   Consultants:  PCCM Vascular surgery  Procedures:  Transthoracic Echocardiogram  (6/23)  Antimicrobials: None    Subjective: Patient reports some anxiety. She also reports some left lower chest pain when her blood pressure gets a little higher. No other issues overnight.  Objective: BP 112/70 (BP Location: Left Arm)   Pulse 69   Temp 98.2 F (36.8 C) (Oral)   Resp 16   Ht 5\' 3"  (1.6 m)   Wt 50.5 kg   SpO2 100%   BMI 19.72 kg/m   Examination:  General exam: Appears calm and comfortable. Thin appearing. Respiratory system: Clear to auscultation. Respiratory effort normal. Cardiovascular system: S1 & S2 heard, RRR. 1/6 systolic murmur. Gastrointestinal system: Abdomen is nondistended, soft and nontender. No organomegaly or masses felt. Normal bowel sounds heard. Central nervous system: Alert and oriented. No focal neurological deficits. Musculoskeletal: No edema. No calf tenderness Skin: No cyanosis. No rashes Psychiatry: Judgement and insight appear normal. Mood & affect appropriate.    Data Reviewed: I have personally reviewed following labs and imaging studies  CBC Lab Results  Component Value Date   WBC 5.0 10/17/2021   RBC 4.04 10/17/2021   HGB 11.6 (L) 10/17/2021   HCT 34.2 (L) 10/17/2021   MCV 84.7 10/17/2021   MCH 28.7 10/17/2021   PLT 199 10/17/2021   MCHC 33.9 10/17/2021   RDW 13.6 10/17/2021   LYMPHSABS 1.8 10/17/2021   MONOABS 0.3 10/17/2021   EOSABS 0.0 10/17/2021   BASOSABS 0.0 10/17/2021     Last metabolic panel Lab Results  Component Value Date   NA 141 10/19/2021   K 3.5 10/19/2021   CL 109 10/19/2021   CO2 26 10/19/2021   BUN 9 10/19/2021   CREATININE 1.47 (H) 10/19/2021   GLUCOSE 90 10/19/2021   GFRNONAA 42 (L) 10/19/2021   GFRAA >60 08/30/2018   CALCIUM 8.5 (L) 10/19/2021   PHOS 2.5 10/18/2021   PROT 6.4 (  L) 10/15/2021   ALBUMIN 3.9 10/15/2021   LABGLOB 1.8 08/04/2016   AGRATIO 2.0 08/04/2016   BILITOT 0.9 10/15/2021   ALKPHOS 52 10/15/2021   AST 22 10/15/2021   ALT 20 10/15/2021   ANIONGAP 6 10/19/2021     GFR: Estimated Creatinine Clearance: 34.9 mL/min (A) (by C-G formula based on SCr of 1.47 mg/dL (H)).  Recent Results (from the past 240 hour(s))  Resp Panel by RT-PCR (Flu A&B, Covid) Anterior Nasal Swab     Status: None   Collection Time: 10/12/21 12:45 PM   Specimen: Anterior Nasal Swab  Result Value Ref Range Status   SARS Coronavirus 2 by RT PCR NEGATIVE NEGATIVE Final    Comment: (NOTE) SARS-CoV-2 target nucleic acids are NOT DETECTED.  The SARS-CoV-2 RNA is generally detectable in upper respiratory specimens during the acute phase of infection. The lowest concentration of SARS-CoV-2 viral copies this assay can detect is 138 copies/mL. A negative result does not preclude SARS-Cov-2 infection and should not be used as the sole basis for treatment or other patient management decisions. A negative result may occur with  improper specimen collection/handling, submission of specimen other than nasopharyngeal swab, presence of viral mutation(s) within the areas targeted by this assay, and inadequate number of viral copies(<138 copies/mL). A negative result must be combined with clinical observations, patient history, and epidemiological information. The expected result is Negative.  Fact Sheet for Patients:  BloggerCourse.com  Fact Sheet for Healthcare Providers:  SeriousBroker.it  This test is no t yet approved or cleared by the [place] FDA and  has been authorized for detection and/or diagnosis of SARS-CoV-2 by FDA under an Emergency Use Authorization (EUA). This EUA will remain  in effect (meaning this test can be used) for the duration of the COVID-19 declaration under Section 564(b)(1) of the Act, 21 U.S.C.section 360bbb-3(b)(1), unless the authorization is terminated  or revoked sooner.       Influenza A by PCR NEGATIVE NEGATIVE Final   Influenza B by PCR NEGATIVE NEGATIVE Final    Comment: (NOTE) The Xpert  Xpress SARS-CoV-2/FLU/RSV plus assay is intended as an aid in the diagnosis of influenza from Nasopharyngeal swab specimens and should not be used as a sole basis for treatment. Nasal washings and aspirates are unacceptable for Xpert Xpress SARS-CoV-2/FLU/RSV testing.  Fact Sheet for Patients: BloggerCourse.com  Fact Sheet for Healthcare Providers: SeriousBroker.it  This test is not yet approved or cleared by the [place] FDA and has been authorized for detection and/or diagnosis of SARS-CoV-2 by FDA under an Emergency Use Authorization (EUA). This EUA will remain in effect (meaning this test can be used) for the duration of the COVID-19 declaration under Section 564(b)(1) of the Act, 21 U.S.C. section 360bbb-3(b)(1), unless the authorization is terminated or revoked.  Performed at [facility], [address], [place], [place] [address]   MRSA Next Gen by PCR, Nasal     Status: None   Collection Time: 10/15/21  8:23 PM   Specimen: Nasal Mucosa; Nasal Swab  Result Value Ref Range Status   MRSA by PCR Next Gen NOT DETECTED NOT DETECTED Final    Comment: (NOTE) The GeneXpert MRSA Assay (FDA approved for NASAL specimens only), is one component of a comprehensive MRSA colonization surveillance program. It is not intended to diagnose MRSA infection nor to guide or monitor treatment for MRSA infections. Test performance is not FDA approved in patients less than 31 years old. Performed at [facility], [address] [place], [place] [address]       Radiology Studies: No results found.    LOS: 5 days    Jacquelin Hawking, MD [facility] 10/20/2021, 11:48 AM   If 7PM-7AM, please contact night-coverage [email or web address]

## 2021-10-21 ENCOUNTER — Other Ambulatory Visit: Payer: Self-pay

## 2021-10-21 ENCOUNTER — Emergency Department (HOSPITAL_COMMUNITY): Payer: Medicaid Other

## 2021-10-21 ENCOUNTER — Other Ambulatory Visit (HOSPITAL_COMMUNITY): Payer: Self-pay

## 2021-10-21 ENCOUNTER — Encounter (HOSPITAL_COMMUNITY): Payer: Self-pay | Admitting: *Deleted

## 2021-10-21 ENCOUNTER — Inpatient Hospital Stay (HOSPITAL_COMMUNITY)
Admission: EM | Admit: 2021-10-21 | Discharge: 2021-10-22 | Disposition: A | Discharge status: Home / Self Care | Payer: Medicaid Other | Source: Home / Self Care | Attending: Internal Medicine | Admitting: Internal Medicine

## 2021-10-21 DIAGNOSIS — I71012 Dissection of descending thoracic aorta: Secondary | ICD-10-CM | POA: Diagnosis not present

## 2021-10-21 DIAGNOSIS — N179 Acute kidney failure, unspecified: Secondary | ICD-10-CM | POA: Diagnosis present

## 2021-10-21 DIAGNOSIS — Z720 Tobacco use: Secondary | ICD-10-CM | POA: Diagnosis present

## 2021-10-21 DIAGNOSIS — Z79899 Other long term (current) drug therapy: Secondary | ICD-10-CM

## 2021-10-21 DIAGNOSIS — Z9049 Acquired absence of other specified parts of digestive tract: Secondary | ICD-10-CM

## 2021-10-21 DIAGNOSIS — I129 Hypertensive chronic kidney disease with stage 1 through stage 4 chronic kidney disease, or unspecified chronic kidney disease: Secondary | ICD-10-CM | POA: Diagnosis present

## 2021-10-21 DIAGNOSIS — D649 Anemia, unspecified: Secondary | ICD-10-CM | POA: Diagnosis present

## 2021-10-21 DIAGNOSIS — T40601A Poisoning by unspecified narcotics, accidental (unintentional), initial encounter: Principal | ICD-10-CM

## 2021-10-21 DIAGNOSIS — F411 Generalized anxiety disorder: Secondary | ICD-10-CM | POA: Diagnosis present

## 2021-10-21 DIAGNOSIS — I71019 Dissection of thoracic aorta, unspecified: Secondary | ICD-10-CM | POA: Diagnosis present

## 2021-10-21 DIAGNOSIS — I959 Hypotension, unspecified: Secondary | ICD-10-CM | POA: Diagnosis present

## 2021-10-21 DIAGNOSIS — R778 Other specified abnormalities of plasma proteins: Secondary | ICD-10-CM | POA: Diagnosis present

## 2021-10-21 DIAGNOSIS — Z882 Allergy status to sulfonamides status: Secondary | ICD-10-CM

## 2021-10-21 DIAGNOSIS — Z8249 Family history of ischemic heart disease and other diseases of the circulatory system: Secondary | ICD-10-CM

## 2021-10-21 DIAGNOSIS — T401X1A Poisoning by heroin, accidental (unintentional), initial encounter: Secondary | ICD-10-CM | POA: Diagnosis present

## 2021-10-21 DIAGNOSIS — N1831 Chronic kidney disease, stage 3a: Secondary | ICD-10-CM | POA: Diagnosis present

## 2021-10-21 DIAGNOSIS — F1910 Other psychoactive substance abuse, uncomplicated: Secondary | ICD-10-CM | POA: Insufficient documentation

## 2021-10-21 DIAGNOSIS — G9340 Encephalopathy, unspecified: Secondary | ICD-10-CM | POA: Diagnosis not present

## 2021-10-21 DIAGNOSIS — Z888 Allergy status to other drugs, medicaments and biological substances status: Secondary | ICD-10-CM

## 2021-10-21 DIAGNOSIS — Z886 Allergy status to analgesic agent status: Secondary | ICD-10-CM

## 2021-10-21 DIAGNOSIS — F17210 Nicotine dependence, cigarettes, uncomplicated: Secondary | ICD-10-CM | POA: Diagnosis present

## 2021-10-21 DIAGNOSIS — G928 Other toxic encephalopathy: Secondary | ICD-10-CM | POA: Diagnosis present

## 2021-10-21 DIAGNOSIS — E7800 Pure hypercholesterolemia, unspecified: Secondary | ICD-10-CM | POA: Diagnosis present

## 2021-10-21 DIAGNOSIS — T50911A Poisoning by multiple unspecified drugs, medicaments and biological substances, accidental (unintentional), initial encounter: Secondary | ICD-10-CM | POA: Diagnosis present

## 2021-10-21 HISTORY — DX: Dissection of unspecified site of aorta: I71.00

## 2021-10-21 LAB — HEPATIC FUNCTION PANEL
ALT: 31 U/L (ref 0–44)
AST: 32 U/L (ref 15–41)
Albumin: 3.6 g/dL (ref 3.5–5.0)
Alkaline Phosphatase: 49 U/L (ref 38–126)
Bilirubin, Direct: <0.1 mg/dL (ref 0.0–0.2)
Total Bilirubin: 0.2 mg/dL — ABNORMAL LOW (ref 0.3–1.2)
Total Protein: 5.9 g/dL — ABNORMAL LOW (ref 6.5–8.1)

## 2021-10-21 LAB — URINALYSIS, ROUTINE W REFLEX MICROSCOPIC
Bilirubin Urine: NEGATIVE
Glucose, UA: NEGATIVE mg/dL
Hgb urine dipstick: NEGATIVE
Ketones, ur: NEGATIVE mg/dL
Nitrite: NEGATIVE
Protein, ur: NEGATIVE mg/dL
Specific Gravity, Urine: 1.004 — ABNORMAL LOW (ref 1.005–1.030)
pH: 7 (ref 5.0–8.0)

## 2021-10-21 LAB — I-STAT CHEM 8, ED
BUN: 17 mg/dL (ref 6–20)
Calcium, Ion: 0.92 mmol/L — ABNORMAL LOW (ref 1.15–1.40)
Chloride: 108 mmol/L (ref 98–111)
Creatinine, Ser: 2.1 mg/dL — ABNORMAL HIGH (ref 0.44–1.00)
Glucose, Bld: 99 mg/dL (ref 70–99)
HCT: 31 % — ABNORMAL LOW (ref 36.0–46.0)
Hemoglobin: 10.5 g/dL — ABNORMAL LOW (ref 12.0–15.0)
Potassium: 5.4 mmol/L — ABNORMAL HIGH (ref 3.5–5.1)
Sodium: 135 mmol/L (ref 135–145)
TCO2: 26 mmol/L (ref 22–32)

## 2021-10-21 LAB — BASIC METABOLIC PANEL
Anion gap: 6 (ref 5–15)
BUN: 13 mg/dL (ref 6–20)
CO2: 27 mmol/L (ref 22–32)
Calcium: 9.1 mg/dL (ref 8.9–10.3)
Chloride: 106 mmol/L (ref 98–111)
Creatinine, Ser: 1.98 mg/dL — ABNORMAL HIGH (ref 0.44–1.00)
GFR, Estimated: 29 mL/min — ABNORMAL LOW (ref >60)
Glucose, Bld: 110 mg/dL — ABNORMAL HIGH (ref 70–99)
Potassium: 4.3 mmol/L (ref 3.5–5.1)
Sodium: 139 mmol/L (ref 135–145)

## 2021-10-21 LAB — CBC
HCT: 33.7 % — ABNORMAL LOW (ref 36.0–46.0)
Hemoglobin: 10.3 g/dL — ABNORMAL LOW (ref 12.0–15.0)
MCH: 27.8 pg (ref 26.0–34.0)
MCHC: 30.6 g/dL (ref 30.0–36.0)
MCV: 91.1 fL (ref 80.0–100.0)
Platelets: 248 10*3/uL (ref 150–400)
RBC: 3.7 MIL/uL — ABNORMAL LOW (ref 3.87–5.11)
RDW: 14 % (ref 11.5–15.5)
WBC: 5.1 10*3/uL (ref 4.0–10.5)
nRBC: 0 % (ref 0.0–0.2)

## 2021-10-21 LAB — TSH: TSH: 5.958 u[IU]/mL — ABNORMAL HIGH (ref 0.350–4.500)

## 2021-10-21 LAB — TROPONIN I (HIGH SENSITIVITY)
Troponin I (High Sensitivity): 27 ng/L — ABNORMAL HIGH (ref <18)
Troponin I (High Sensitivity): 27 ng/L — ABNORMAL HIGH (ref <18)

## 2021-10-21 LAB — RAPID URINE DRUG SCREEN, HOSP PERFORMED
Amphetamines: NOT DETECTED
Barbiturates: NOT DETECTED
Benzodiazepines: POSITIVE — AB
Cocaine: POSITIVE — AB
Opiates: POSITIVE — AB
Tetrahydrocannabinol: NOT DETECTED

## 2021-10-21 LAB — I-STAT BETA HCG BLOOD, ED (MC, WL, AP ONLY): I-stat hCG, quantitative: <5 m[IU]/mL (ref <5)

## 2021-10-21 LAB — MAGNESIUM: Magnesium: 2.4 mg/dL (ref 1.7–2.4)

## 2021-10-21 LAB — CBG MONITORING, ED: Glucose-Capillary: 112 mg/dL — ABNORMAL HIGH (ref 70–99)

## 2021-10-21 MED ORDER — ACETAMINOPHEN 650 MG RE SUPP: 650.0000 mg | Freq: Four times a day (QID) | RECTAL | Status: DC | PRN

## 2021-10-21 MED ORDER — THIAMINE HCL 100 MG PO TABS
100.0000 mg | ORAL_TABLET | Freq: Every day | ORAL | Status: DC
  Administered 2021-10-22: 100 mg via ORAL
  Filled 2021-10-21: qty 1

## 2021-10-21 MED ORDER — NALOXONE HCL 0.4 MG/ML IJ SOLN: 0.4000 mg | INTRAMUSCULAR | Status: DC | PRN

## 2021-10-21 MED ORDER — CLONIDINE HCL 0.2 MG PO TABS
0.2000 mg | ORAL_TABLET | Freq: Three times a day (TID) | ORAL | 1 refills | Status: DC
  Filled 2021-10-21: qty 90, 30d supply, fill #0

## 2021-10-21 MED ORDER — LACTATED RINGERS IV SOLN
INTRAVENOUS | Status: DC
Start: 2021-10-21 — End: 2021-10-22

## 2021-10-21 MED ORDER — SODIUM CHLORIDE 0.9 % IV BOLUS
1000.0000 mL | Freq: Once | INTRAVENOUS | Status: AC
  Administered 2021-10-21: 1000 mL via INTRAVENOUS

## 2021-10-21 MED ORDER — NICOTINE 14 MG/24HR TD PT24
14.0000 mg | MEDICATED_PATCH | Freq: Every day | TRANSDERMAL | 0 refills | Status: AC
  Filled 2021-10-21: qty 28, 28d supply, fill #0

## 2021-10-21 MED ORDER — NALOXONE HCL 0.4 MG/ML IJ SOLN
0.4000 mg | Freq: Once | INTRAMUSCULAR | Status: AC
  Administered 2021-10-21: 0.4 mg via INTRAVENOUS
  Filled 2021-10-21: qty 1

## 2021-10-21 MED ORDER — NICOTINE 14 MG/24HR TD PT24
14.0000 mg | MEDICATED_PATCH | Freq: Every day | TRANSDERMAL | Status: DC
  Administered 2021-10-22: 14 mg via TRANSDERMAL
  Filled 2021-10-21: qty 1

## 2021-10-21 MED ORDER — SODIUM ZIRCONIUM CYCLOSILICATE 10 G PO PACK
10.0000 g | PACK | Freq: Once | ORAL | Status: DC
Start: 2021-10-21 — End: 2021-10-21

## 2021-10-21 MED ORDER — LABETALOL HCL 300 MG PO TABS
300.0000 mg | ORAL_TABLET | Freq: Three times a day (TID) | ORAL | 1 refills | Status: DC
  Filled 2021-10-21: qty 90, 30d supply, fill #0

## 2021-10-21 MED ORDER — FERROUS SULFATE 325 (65 FE) MG PO TABS
325.0000 mg | ORAL_TABLET | Freq: Every day | ORAL | Status: DC
  Administered 2021-10-22: 325 mg via ORAL
  Filled 2021-10-21: qty 1

## 2021-10-21 MED ORDER — ACETAMINOPHEN 325 MG PO TABS
650.0000 mg | ORAL_TABLET | Freq: Four times a day (QID) | ORAL | Status: DC | PRN
  Administered 2021-10-22: 650 mg via ORAL
  Filled 2021-10-21: qty 2

## 2021-10-21 MED ORDER — HYDROXYZINE HCL 25 MG PO TABS
25.0000 mg | ORAL_TABLET | Freq: Four times a day (QID) | ORAL | Status: DC | PRN
  Administered 2021-10-22 (×2): 25 mg via ORAL
  Filled 2021-10-21 (×2): qty 1

## 2021-10-21 MED ORDER — THIAMINE HCL 100 MG PO TABS
100.0000 mg | ORAL_TABLET | Freq: Every day | ORAL | 1 refills | Status: AC
  Filled 2021-10-21: qty 30, 30d supply, fill #0

## 2021-10-21 NOTE — Progress Notes (Signed)
Transition of Care [facility]) - Emergency Department Mini Assessment   Patient Details  Name: [name] MRN: [id number] Date of Birth: 1965/08/17  Transition of Care [facility]) CM/SW Contact:    Mayling Aber C Tarpley-Carter, LCSWA Phone Number: 10/21/2021, 7:33 PM   Clinical Narrative: [facility] CSW consulted with pt in regards to being houseless.  Pt informed CSW she was going to [employer] and has been in contact with [facility] [phone].  CSW will follow-up with [facility].  Mingo Siegert Tarpley-Carter, MSW, LCSW-A Pronouns:  She/Her/Hers [facility] Clinical Social Worker Direct Number:  [phone] [email or web address]  ED Mini Assessment: What brought you to the Emergency Department? : Overdose  Barriers to Discharge: No Barriers Identified     Means of departure: Not know  Interventions which prevented an admission or readmission: Homeless Screening    Patient Contact and Communications       Contact Date: 10/21/21,          Patient states their goals for this hospitalization and ongoing recovery are:: Pt would like to go to [employer].   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 [place] [address] Phone: [phone] Fax: [phone]  [id number] - [place], [place] - [address] [place]  [address] Phone: [phone] Fax: [phone]  [facility] [address] [place] [address] Phone: [phone] Fax: [phone]

## 2021-10-21 NOTE — Assessment & Plan Note (Signed)
 #)   borderline hypotension: This is superimposed on a documented history of hypertension, for which the patient was discharged this morning on scheduled oral labetalol as well as scheduled oral clonidine, with Initial systolic blood pressures noted to be in the 60s, subsequently improving into the systolic 90s to low 100s following interval IV fluids.  Initial hypotension appears to be the basis of the aforementioned unintentional overdose via potentially multiple recreational drugs used in the interval, including that of heroin.  As noted discharge summary from earlier today, goal systolic blood pressure less than 120 as well as goal heart rate less than 80 in the setting of type B thoracic aortic dissection, as above.  Given borderline hypotension at this time, will hold home labetalol and clonidine for now, while providing additional supportive measures, as below, close monitoring of ensuing blood pressure to guide timing of resumption of labetalol/clonidine.  Plan: Monitor on telemetry.  Close monitoring of ensuing blood pressure via routine vital signs.  Holding home oral labetalol and clonidine for now.  Continuous lactated Ringer's, as above.

## 2021-10-21 NOTE — ED Triage Notes (Signed)
The [ts bp on both arms are in the seventies.  She is hardly able to answer questions she was just discharged from this hospital earlier today pts mother is at    the bedside  she reports that   the pt was admitted for a tear in her blood vessell  other than that she is unclear on some facts.  The pt is perspiring

## 2021-10-21 NOTE — Assessment & Plan Note (Signed)
 #)   Thoracic aortic dissection, type B: Known diagnosis thereof, prompting recent hospitalization from 10/15/2021 to 10/21/2021 for medical management thereof following vascular surgery consultation.  Patient continues to undergo medical management thereof, and does have existing outpatient follow-up in vascular surgery clinic.  EDP discussed the patient's case again with vascular surgery today, noting that vascular surgery will continue will reconsult and continue to follow during the present hospitalization.  Goal systolic blood pressure noted to be less than 120, will goal heart rate less than 80, as above.  Plan: Vascular surgery consulted, as above.  Close monitoring of ensuing vital signs, particularly blood pressure and heart rate, with associated target values as above.  Currently holding home clonidine and labetalol in the setting of borderline hypotension.  Monitor on telemetry.

## 2021-10-21 NOTE — Assessment & Plan Note (Signed)
 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  Within this context, presenting labs this evening shows elevated creatinine of 1.98 compared to most recent prior value 1.47 on 10/19/2021.  Suspect that this is multifactorial in nature, with contributions from patient's reported interval recreational drug use including urinary drug screen was positive for cocaine as well as opiates, with resultant presenting hypotension posing an independent prerenal contribution associated with diminished renal perfusion.  Of note, urinalysis with microscopy notable for no white blood cells, no red blood cells, and was also negative for protein.  Per EDP discussions with on-call vascular surgery, will provide overnight IV fluids, closely monitor in setting blood pressure for improved systolic perfusion, while trending closely noting target systolic blood pressure less than 120, as outlined above.  Of note, it appears that the patient's previous lisinopril was held at time of discharge from the hospital earlier today.   Plan: monitor strict I's & O's and daily weights. Attempt to avoid nephrotoxic agents. Refrain from NSAIDs, including holding of outpatient prn naproxen. Repeat CMP in the morning.  Add-on random urine sodium and random urine creatinine.  Add on CPK level, particularly given UDS was positive for cocaine.  Continuous LR.

## 2021-10-21 NOTE — Progress Notes (Signed)
Pt is hemodynamically stable. BP is well controlled. She ambulates independently in her room.   Pt tearfully expressed that she has no places to stay if discharged from the hospital. She asked if the CSW can help advocate for her to the other Drug-detox and rehab facilities. We have to discuss with the morning round team at am.  Pt had complaints of mid upper abdominal pain, nausea, and excessively craving for food, especially sweet sugar drinks and ice cream. She has had no self-controlled,repeatedly requested for sweet food and drinks.  Oxycodone 10 mg q 4 hr for pain, Hydroxyzine for anxiety, and Zofran for nausea.   CIWA score 4-6. Symptoms relived with medications and emotional support.   We will continue to monitor.  Filiberto Pinks, RN

## 2021-10-21 NOTE — Assessment & Plan Note (Signed)
  #)   Generalized anxiety sorter: Documented history of symptomatic prn hydroxyzine as an outpatient.  Plan: Continue outpatient prn Hydroxyzine.

## 2021-10-21 NOTE — H&P (Signed)
History and Physical    PLEASE NOTE THAT DRAGON DICTATION SOFTWARE WAS USED IN THE CONSTRUCTION OF THIS NOTE.   [name] OQH:[id number] DOB: 01/02/1966 DOA: 10/21/2021  PCP: System, Provider Not In  (will further assess) Patient coming from: home   I have personally briefly reviewed patient's old medical records in [place]  Chief Complaint: Altered mental status  HPI: [name] is a 56 y.o. female with medical history significant for thoracic aortic dissection, TB, essential hypertension, generalized anxiety sorter, chronic anemia associated baseline hemoglobin 11-13, polysubstance abuse, chronic tobacco abuse, who is admitted to [facility] on 10/21/2021 with acute kidney injury superimposed on suspected CKD stage IIIa after presenting from home to [facility] ED for evaluation of altered mental status.   In the setting of the patient's altered mental status, the following history provided by the patient's mother, in addition to my discussions with the EDP and via chart review.  This patient was just hospitalized from the course of the last week, from 10/15/2021 to 10/21/2021, for thoracic aortic dissection, type B, which was medically managed over that time, primarily of area aggressive antihypertensive control, upon which the patient was discharged on scheduled oral labetalol as well as oral clonidine.  Vascular surgery was consulted during his previous hospitalization during which the patient underwent no associated surgical intervention, arrangement for outpatient follow-up in vascular surgery clinic was made.   In the setting of the patient's history of colon polysubstance abuse, including that of recurrent use of heroin, arrangements were made during his previous hospitalization outpatient drug rehab, to which the patient was discharged earlier today.  She was reportedly being driven to outpatient drug rehab by her mother, accompanied by the patient's boyfriend, who  reportedly provided patient with some heroin to use prior to arrival at drug rehab. Associated route of administration of the heroin conveyed to be  intranasal inhalation.  Shortly after use of heroin, the patient was noted to be more somnolent relative to baseline mental status, prompting the patient to be brought to [facility] emergency department for further evaluation and management thereof.  Per additional chart review, including review of discharge summary from hospitalization concluding earlier today, the patient's previous antihypertensive regimen leading up to most recent hospitalization, which included lisinopril and HCTZ, with held at time of discharge, in favor of newly initiated scheduled labetalol and scheduled p.o. clonidine.  Per review of discharge summary, via vascular surgery consultation, goal systolic blood pressure is less than 120, goal heart rate is less than 80.  Throat is most recent disposition, serum creatinine is noted to be in the range of 1.2-3 to 1.47, the latter representing most recent prior serum creatinine data point leading up to discharge from the hospital, with the latter serum creatinine level checked on 10/19/2021.  Patient's baseline creatinine level is not entirely clear, however, per review of most recent discharge summary, there is suspicion that the mild elevation in patient's serum creatinine level throughout this most recent hospitalization may represent CKD 3A.      ED Course:  Vital signs in the ED were notable for the following: Afebrile; heart rate 65-78; initial blood pressure 63/49, with subsequent proved to 108/58 following interval 1 L normal saline bolus, as further detailed below, most recent blood pressure noted to be 96/62.  Respiratory rate 14-19, oxygen saturation 97 to 100% on room air.  Labs were notable for the following: CMP notable for the following: Potassium 4.3, bicarbonate 27, anion gap 6, BUN 13,  creatinine 1.98, alk phos 110, and  liver enzymes found to be within normal limits.  Serum magnesium level 2.4.  High-sensitivity troponin I noted to be 27x2 values, relative to most recent prior value of 9 when checked on 10/13/2021.  CBC notable for white blood cell count 5100, hemoglobin 10.3 compared to 11.6 on 10/17/2021, with presenting hemoglobin associated normocytic/normochromic findings as well as nonelevated RDW, platelet count 240.  Urinary drug screen was positive for opiates, benzodiazepines, and cocaine.  TSH noted to be 5.958.  Urinalysis showed no white blood cells, no red blood cells, nitrate negative, and was also negative for protein.  Imaging and additional notable ED work-up: Presenting EKG, in comparison to most recent prior from 10/19/2021 demonstrated normal sinus rhythm with a rate 60, normal intervals, nonspecific T wave inversion in V1, unchanged from most recent prior EKG, no evidence of ST changes, including no evidence of ST elevation.  Chest x-ray showed unchanged widened mediastinum in the setting of known aortic dissection, and otherwise showed no evidence of acute cardiopulmonary process, including no evidence of infiltrate, edema, effusion, or pneumothorax.  EDP discussed the patient's case with on-call vascular surgeon, Dr. Virl Cagey, Who conveyed that vascular surgery will consult and continue to follow during this hospitalization, recommending for further evaluation and management of the patient's AKI, including recommendation for interval IV fluid resuscitation.   While in the ED, the following were administered: Narcan 0.4 mg IV x1, normal symptoms will or bolus.  Subsequently, the patient was admitted for further evaluation and management of acute kidney injury superimposed on suspected stage IIIa CKD, in the setting of acute toxic encephalopathy due to polysubstance abuse, with presentation also notable for initial hypotension, which is improving with IV fluids, as well as laboratory findings notable for  acute on chronic anemia.     Review of Systems: As per HPI otherwise 10 point review of systems negative.   Past Medical History:  Diagnosis Date   Aortic dissection (HCC)    Carpal tunnel syndrome    Hypercholesteremia    Hypertension     Past Surgical History:  Procedure Laterality Date   CARPAL TUNNEL RELEASE     CESAREAN SECTION     CHOLECYSTECTOMY      Social History:  reports that she has been smoking cigarettes. She has been smoking an average of .3 packs per day. She has never used smokeless tobacco. She reports current drug use. She reports that she does not drink alcohol.   Allergies  Allergen Reactions   Ibuprofen Other (See Comments)    Blood in stool   Benadryl [Diphenhydramine] Anxiety   Sulfa Antibiotics Itching    Family History  Problem Relation Age of Onset   Hypertension Mother    Hypertension Father    Cancer Maternal Grandfather      Prior to Admission medications   Medication Sig Start Date End Date Taking? Authorizing Provider  acetaminophen (TYLENOL) 650 MG CR tablet Take 650 mg by mouth every 6 (six) hours as needed for pain.    [provider]  alum & mag hydroxide-simeth (MAALOX/MYLANTA) 200-200-20 MG/5ML suspension Take 30 mLs by mouth every 4 (four) hours as needed for indigestion.    [provider]  cloNIDine (CATAPRES) 0.2 MG tablet Take 1 tablet (0.2 mg total) by mouth 3 (three) times daily. 10/21/21   Shelly Coss, MD  Cyanocobalamin (VITAMIN B-12 PO) Take 1 tablet by mouth daily.    [provider]  dicyclomine (BENTYL) 20 MG tablet  Take 20 mg by mouth every 6 (six) hours as needed for spasms.    [provider]  Ferrous Sulfate (IRON PO) Take 1 tablet by mouth daily.    [provider]  hydrOXYzine (ATARAX) 25 MG tablet Take 25 mg by mouth every 6 (six) hours as needed for anxiety.    [provider]  labetalol (NORMODYNE) 300 MG tablet Take 1 tablet (300 mg total) by mouth 3  (three) times daily. 10/21/21   Shelly Coss, MD  loperamide (IMODIUM) 2 MG capsule Take 2-4 mg by mouth as needed for diarrhea or loose stools.    [provider]  Magnesium Hydroxide (MILK OF MAGNESIA PO) Take 30 mLs by mouth daily as needed (constipation).    [provider]  methocarbamol (ROBAXIN) 500 MG tablet Take 500 mg by mouth every 8 (eight) hours as needed for muscle spasms.    [provider]  Multiple Vitamin (MULTIVITAMIN WITH MINERALS) TABS tablet Take 1 tablet by mouth daily.    [provider]  Naproxen Sod-diphenhydrAMINE (ALEVE PM) 220-25 MG TABS Take 2 tablets by mouth at bedtime as needed (For sleep or pain).    [provider]  nicotine (NICODERM CQ - DOSED IN MG/24 HOURS) 14 mg/24hr patch Place 1 patch (14 mg total) onto the skin daily. 10/22/21   Shelly Coss, MD  ondansetron (ZOFRAN-ODT) 4 MG disintegrating tablet Take 4 mg by mouth every 6 (six) hours as needed for nausea or vomiting.    [provider]  Probiotic Product (PROBIOTIC PO) Take 1 tablet by mouth daily.    [provider]  thiamine 100 MG tablet Take 1 tablet (100 mg total) by mouth daily. 10/22/21   Shelly Coss, MD  traZODone (DESYREL) 50 MG tablet Take 50 mg by mouth at bedtime as needed for sleep.    [provider]     Objective    Physical Exam: Vitals:   10/21/21 1730 10/21/21 1800 10/21/21 1821 10/21/21 1945  BP: (!) 150/73 (!) 103/51 (!) 108/58 91/61  Pulse: 64 77 78 68  Resp: '12 15 19 14  ' Temp:      TempSrc:      SpO2: 100% 100% 100% 98%  Weight:      Height:        General: appears to be stated age; somnolent , opens eyes briefly to verbal stimuli before falling back asleep;  Skin: warm, dry, no rash Head:  AT/[place] Mouth:  Oral mucosa membranes appear moist, normal dentition Neck: supple; trachea midline Heart:  RRR; did not appreciate any M/R/G Lungs: CTAB, did not appreciate any wheezes, rales, or  rhonchi Abdomen: + BS; soft, ND, NT Vascular: 2+ pedal pulses b/l; 2+ radial pulses b/l Extremities: no peripheral edema, no muscle wasting Neuro: In the setting of the patient's current mental status and associated inability to follow instructions, unable to perform full neurologic exam at this time.  As such, assessment of strength, sensation, and cranial nerves is limited at this time. Patient noted to spontaneously move all 4 extremities. No tremors.     Labs on Admission: I have personally reviewed following labs and imaging studies  CBC: Recent Labs  Lab 10/15/21 1332 10/16/21 0500 10/17/21 0319 10/21/21 1618 10/21/21 1658  WBC 7.2 7.6 5.0 5.1  --   NEUTROABS 5.1  --  2.8  --   --   HGB 14.3 13.6 11.6* 10.3* 10.5*  HCT 44.0 40.1 34.2* 33.7* 31.0*  MCV 86.3 84.4 84.7  91.1  --   PLT 303 263 199 248  --    Basic Metabolic Panel: Recent Labs  Lab 10/16/21 0500 10/17/21 0319 10/18/21 0935 10/19/21 0745 10/21/21 1618 10/21/21 1658  NA 138 141 142 141 139 135  K 3.4* 3.7 3.6 3.5 4.3 5.4*  CL 104 112* 111 109 106 108  CO2 '25 23 26 26 27  ' --   GLUCOSE 130* 100* 80 90 110* 99  BUN '18 13 9 9 13 17  ' CREATININE 1.23* 1.22* 1.42* 1.47* 1.98* 2.10*  CALCIUM 8.7* 8.5* 8.5* 8.5* 9.1  --   MG 2.2  --  2.0  --  2.4  --   PHOS 3.2  --  2.5  --   --   --    GFR: Estimated Creatinine Clearance: 9.4 mL/min (A) (by C-G formula based on SCr of 2.1 mg/dL (H)). Liver Function Tests: Recent Labs  Lab 10/15/21 1332 10/21/21 1618  AST 22 32  ALT 20 31  ALKPHOS 52 49  BILITOT 0.9 0.2*  PROT 6.4* 5.9*  ALBUMIN 3.9 3.6   No results for input(s): "LIPASE", "AMYLASE" in the last 168 hours. No results for input(s): "AMMONIA" in the last 168 hours. Coagulation Profile: No results for input(s): "INR", "PROTIME" in the last 168 hours. Cardiac Enzymes: No results for input(s): "CKTOTAL", "CKMB", "CKMBINDEX", "TROPONINI" in the last 168 hours. BNP (last 3 results) No results for  input(s): "PROBNP" in the last 8760 hours. HbA1C: No results for input(s): "HGBA1C" in the last 72 hours. CBG: Recent Labs  Lab 10/15/21 2021 10/21/21 1620  GLUCAP 104* 112*   Lipid Profile: No results for input(s): "CHOL", "HDL", "LDLCALC", "TRIG", "CHOLHDL", "LDLDIRECT" in the last 72 hours. Thyroid Function Tests: Recent Labs    10/21/21 1800  TSH 5.958*   Anemia Panel: No results for input(s): "VITAMINB12", "FOLATE", "FERRITIN", "TIBC", "IRON", "RETICCTPCT" in the last 72 hours. Urine analysis:    Component Value Date/Time   COLORURINE YELLOW 10/21/2021 1629   APPEARANCEUR CLEAR 10/21/2021 1629   LABSPEC 1.004 (L) 10/21/2021 1629   PHURINE 7.0 10/21/2021 1629   GLUCOSEU NEGATIVE 10/21/2021 1629   HGBUR NEGATIVE 10/21/2021 1629   BILIRUBINUR NEGATIVE 10/21/2021 1629   KETONESUR NEGATIVE 10/21/2021 1629   PROTEINUR NEGATIVE 10/21/2021 1629   UROBILINOGEN 0.2 12/31/2014 1649   NITRITE NEGATIVE 10/21/2021 1629   LEUKOCYTESUR SMALL (A) 10/21/2021 1629    Radiological Exams on Admission: DG Chest Port 1 View  Result Date: 10/21/2021 CLINICAL DATA:  concerns of dissection EXAM: PORTABLE CHEST 1 VIEW COMPARISON:  Radiograph 10/15/2021 FINDINGS: Unchanged cardiomediastinal silhouette with widened mediastinum due to known aortic dissection. No new airspace disease. No pleural effusion. No pneumothorax. No acute osseous abnormality. IMPRESSION: Unchanged widened mediastinum due to known aortic dissection. No new airspace disease. Electronically Signed   By: Maurine Simmering M.D.   On: 10/21/2021 16:45     EKG: Independently reviewed, with result as described above.    Assessment/Plan   Principal Problem:   AKI (acute kidney injury) ([place]) Active Problems:   Dissecting aneurysm of thoracic aorta, [place] type B (HCC)   Acute encephalopathy   Hypotension   Heroin overdose, accidental or unintentional, initial encounter ([place])   Elevated troponin   Acute on chronic anemia    GAD (generalized anxiety disorder)   Tobacco abuse     #) Acute Kidney Injury superimposed on CKD 3A: Per review of most recent discharge summary, there is suspicion of the patient's mildly elevated creatinine during his  most recent hospitalization, noted to be in the range of 1.23-1.47, represent her baseline, consistent with CKD 3A.  Within this context, presenting labs this evening shows elevated creatinine of 1.98 compared to most recent prior value 1.47 on 10/19/2021.  Suspect that this is multifactorial in nature, with contributions from patient's reported interval recreational drug use including urinary drug screen was positive for cocaine as well as opiates, with resultant presenting hypotension posing an independent prerenal contribution associated with diminished renal perfusion.  Of note, urinalysis with microscopy notable for no white blood cells, no red blood cells, and was also negative for protein.  Per EDP discussions with on-call vascular surgery, will provide overnight IV fluids, closely monitor in setting blood pressure for improved systolic perfusion, while trending closely noting target systolic blood pressure less than 120, as outlined above.  Of note, it appears that the patient's previous lisinopril was held at time of discharge from the hospital earlier today.   Plan: monitor strict I's & O's and daily weights. Attempt to avoid nephrotoxic agents. Refrain from NSAIDs, including holding of outpatient prn naproxen. Repeat CMP in the morning.  Add-on random urine sodium and random urine creatinine.  Add on CPK level, particularly given UDS was positive for cocaine.  Continuous LR.          #) Acute toxic encephalopathy: Patient presents with new onset somnolence associated with diminished responsiveness starting earlier today after reported recreational drug use, including that of heroin, as further detailed above.  Suspect the patient's acute encephalopathy is likely  multifactorial, with suspected metabolic contributions superimposed on aforementioned toxic, noting likely contribution from AKI superimposed on CKD 3A.  No evidence of underlying factious process at this time, including urinalysis that was inconsistent with UTI, while chest x-ray shows no evidence of acute cardiopulmonary process, as above.  Of note, TSH is mildly elevated.  Will add on free T4 for additional correlation/qualification.  We will also check VBG to evaluate for any contribution from hypercapnic encephalopathy. No additional overt metabolic/electrolyte contributions at this time.  No overt acute focal neurologic deficits to suggest a contribution from an underlying acute CVA. Seizures are also felt to be less likely, although this patient is at increased risk for the latter, particular given her reported recreational drug use/presenting UDS findings.    Plan: fall precautions. Repeat CMP/CBC in the AM.  check VBG.  Add on free T4, as above.  Seizure precautions ordered.  Hold outpatient prn trazodone and as needed Robaxin for now.  Transition of care consult in the setting of recurrent polysubstance abuse.  Add on CPK level.  Further evaluation management of AKI on suspected CKD 3A.           #) Unintentional overdose: In the setting of report of use of heroin in the interval following discharge from the hospital earlier today leading up to presenting to drug rehabilitation facility, while noting a updated urinary drug screen is positive for opiates, benzodiazepines, and cocaine.  She has received a single dose of IV Narcan in the emergency department approximately 3 hours ago, and has not required initiation of additional doses of Narcan or any initiation of Narcan drip thus far.  Her initial hypotension has improved following interval 1 L bolus of normal saline.  Additionally, pt appears to be maintaining her airway, without any overt evidence of hypoventilation, and is maintaining oxygen  saturations in the high 90s to 100% on room air.  Plan: Prn Narcan.  Continuous lactated Ringer's, as above.  Monitor continuous pulse symmetry.  Monitor on telemetry.  Repeat CMP in the morning.  Fall precautions ordered.  Seizure precautions ordered.  Consult placed with transition of care team, as above.  Check VBG, as above.         #) borderline hypotension: This is superimposed on a documented history of hypertension, for which the patient was discharged this morning on scheduled oral labetalol as well as scheduled oral clonidine, with Initial systolic blood pressures noted to be in the 60s, subsequently improving into the systolic 93T to low 342A following interval IV fluids.  Initial hypotension appears to be the basis of the aforementioned unintentional overdose via potentially multiple recreational drugs used in the interval, including that of heroin.  As noted discharge summary from earlier today, goal systolic blood pressure less than 120 as well as goal heart rate less than 80 in the setting of type B thoracic aortic dissection, as above.  Given borderline hypotension at this time, will hold home labetalol and clonidine for now, while providing additional supportive measures, as below, close monitoring of ensuing blood pressure to guide timing of resumption of labetalol/clonidine.  Plan: Monitor on telemetry.  Close monitoring of ensuing blood pressure via routine vital signs.  Holding home oral labetalol and clonidine for now.  Continuous lactated Ringer's, as above.        #) Acute on chronic anemia: In setting of apparent chronic anemia associated baseline hemoglobin 11-13, presenting hemoglobin slightly lower at 10.3, subsequent normocytic/hemoglobin as well as nonelevated RDW.  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  No evidence of overt active bleed at this time.  Of note, the patient is on scheduled daily  oral iron supplementation as an outpatient.  Plan: Add on iron studies.  Check INR.  Repeat CBC in the morning.  SCDs.         #(Elevated troponin:: Mildly elevated troponin 27, with repeat value unchanged, relative to most recent prior value of 9 on 10/15/2021.  Suspect type II supply/demand mismatch as a consequence of transient hypoperfusion as result of initial hypotension complicated by decline in oxygen delivery capacity as a result of acute on chronic anemia, with additional contribution from diminished renal clearance in the setting of AKI on CKD 3A.  ACS, thought to be less likely, including presenting EKG, shows normal sinus rhythm without any evidence of acute ischemic changes, including no evidence of ST changes.  Of note, chest x-ray also unchanged from most recent prior, including no evidence of acute interval process.  Patient's recent diagnosis of thoracic aortic dissection, undergoing medical management, associated noted, with vascular surgery consulted and following during this hospitalization, as further noted above.  Of note, echocardiogram was obtained during this most recent prior hospitalization, with this imaging study occurring on 10/16/2021.  Plan: Continue to trend troponin.  Monitor on telemetry..  Repeat serum magnesium level in the morning.  Further evaluation management borderline hypotension as well as AKI on CKD 3A, as above.        #) Thoracic aortic dissection, type B: Known diagnosis thereof, prompting recent hospitalization from 10/15/2021 to 10/21/2021 for medical management thereof following vascular surgery consultation.  Patient continues to undergo medical management thereof, and does have existing outpatient follow-up in vascular surgery clinic.  EDP discussed the patient's case again with vascular surgery today, noting that vascular surgery will continue will reconsult and continue to follow during the present hospitalization.  Goal systolic blood pressure  noted to be less than 120, will goal heart rate less than 80, as above.  Plan: Vascular surgery consulted, as above.  Close monitoring of ensuing vital signs, particularly blood pressure and heart rate, with associated target values as above.  Currently holding home clonidine and labetalol in the setting of borderline hypotension.  Monitor on telemetry.         #) Generalized anxiety sorter: Documented history of symptomatic prn hydroxyzine as an outpatient.  Plan: Continue outpatient prn Hydroxyzine.        #) Chronic tobacco abuse: Per chart review, patient has had history of chronic tobacco abuse, most recently documented as use of 1/3 pack/day.  Notable in the setting of her thoracic aortic dissection, as above.  She is prescribed a daily nicotine patch as an outpatient.  Plan: Resume home schedule nicotine patch.  Anticipate additional counseling as patient's mental status improves.         DVT prophylaxis: SCD's   Code Status: Full code Family Communication: none Disposition Plan: Per Rounding Team Consults called: On-call vascular surgery, Dr. Virl Cagey, consulted, as further detailed above.;  Admission status: Inpatient; PCU   PLEASE NOTE THAT DRAGON DICTATION SOFTWARE WAS USED IN THE CONSTRUCTION OF THIS NOTE.   [place] DO [facility]  From [place]   10/21/2021, 8:57 PM

## 2021-10-21 NOTE — Discharge Summary (Signed)
Physician Discharge Summary  Harleen Bellavance [id number] DOB: 05-28-1965 DOA: 10/15/2021  PCP: Clent Demark, PA-C  Admit date: 10/15/2021 Discharge date: 10/21/2021  Admitted From: Home Disposition:  Home  Discharge Condition:Stable CODE STATUS:FULL Diet recommendation: Heart Healthy  Brief/Interim Summary: Merilee Arauza is a 56 y.o. female with a history of hypertension, polysubstance abuse, depression, homelessness. Patient presented from voluntary admission to behavioral health, for treatment of opiate addiction, secondary to chest pain. 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 Patient managed conservatively with tight blood pressure control and improvement of symptoms.  Vascular surgery did not recommend any acute intervention and recommended outpatient follow-up.  Medically stable for discharge today.  Following problems were addressed during her hospitalization:  Type B aortic dissection Extending to left external iliac, internal iliac and femoral arteries. Secondary to hypertensive emergency. Patient admitted to the ICU. Vascular surgery consulted. Patient managed conservatively with tight blood pressure control. Chest pain improved with management. Vascular surgery recommending outpatient follow-up.   Hypertensive emergency Patient started on Cleviprex and esmolol with a target  SBP <120/HR <80. Patient with improved blood pressure control and she was transitioned to labetalol and clonidine. -Continue labetalol and clonidine   AKI Appears to possibly be CKD stage IIIa rather than AKI. Creatinine stable.  Monitor as an outpatient   Hypokalemia Mild. Resolved with repletion.   Polysubstance abuse Consult physician.  Outpatient follow-up with rehabilitation services.  TOC was following   Anxiety -hydroxyzine PRN     Discharge Diagnoses:  Principal Problem:   Dissecting aneurysm  of thoracic aorta, Stanford type B ([place]) Active Problems:   Hypertensive crisis   AKI (acute kidney injury) ([place])   Opioid withdrawal (HCC)   Hypercholesteremia   Chest pain   Hypokalemia    Discharge Instructions  Discharge Instructions     Diet - low sodium heart healthy   Complete by: As directed    Discharge instructions   Complete by: As directed    1)Please quit substance abuse including cocaine 2)Follow up with vascular surgery month.  Name and number the provider has been addressed.  Do a CT angiogram of chest, abdomen/pelvis during the visit 3)Monitor your blood pressure.  Take prescribed medications as instructed   Increase activity slowly   Complete by: As directed       Allergies as of 10/21/2021       Reactions   Ibuprofen Other (See Comments)   Blood in stool   Benadryl [diphenhydramine] Anxiety   Sulfa Antibiotics Itching        Medication List     STOP taking these medications    aspirin EC 81 MG tablet   hydrochlorothiazide 25 MG tablet Commonly known as: HYDRODIURIL   lisinopril 10 MG tablet Commonly known as: ZESTRIL       TAKE these medications    acetaminophen 650 MG CR tablet Commonly known as: TYLENOL Take 650 mg by mouth every 6 (six) hours as needed for pain.   Aleve PM 220-25 MG Tabs Generic drug: Naproxen Sod-diphenhydrAMINE Take 2 tablets by mouth at bedtime as needed (For sleep or pain).   alum & mag hydroxide-simeth 200-200-20 MG/5ML suspension Commonly known as: MAALOX/MYLANTA Take 30 mLs by mouth every 4 (four) hours as needed for indigestion.   cloNIDine 0.2 MG tablet Commonly known as: CATAPRES Take 1 tablet (0.2 mg total) by mouth 3 (three) times daily. What changed:  medication strength how much to take  when to take this   dicyclomine 20 MG tablet Commonly known as: BENTYL Take 20 mg by mouth every 6 (six) hours as needed for spasms.   hydrOXYzine 25 MG tablet Commonly known as: ATARAX Take 25 mg by  mouth every 6 (six) hours as needed for anxiety.   IRON PO Take 1 tablet by mouth daily.   labetalol 300 MG tablet Commonly known as: NORMODYNE Take 1 tablet (300 mg total) by mouth 3 (three) times daily.   loperamide 2 MG capsule Commonly known as: IMODIUM Take 2-4 mg by mouth as needed for diarrhea or loose stools.   methocarbamol 500 MG tablet Commonly known as: ROBAXIN Take 500 mg by mouth every 8 (eight) hours as needed for muscle spasms.   MILK OF MAGNESIA PO Take 30 mLs by mouth daily as needed (constipation).   multivitamin with minerals Tabs tablet Take 1 tablet by mouth daily.   nicotine 14 mg/24hr patch Commonly known as: NICODERM CQ - dosed in mg/24 hours Place 1 patch (14 mg total) onto the skin daily. Start taking on: October 22, 2021   ondansetron 4 MG disintegrating tablet Commonly known as: ZOFRAN-ODT Take 4 mg by mouth every 6 (six) hours as needed for nausea or vomiting.   PROBIOTIC PO Take 1 tablet by mouth daily.   thiamine 100 MG tablet Take 1 tablet (100 mg total) by mouth daily. Start taking on: October 22, 2021   traZODone 50 MG tablet Commonly known as: DESYREL Take 50 mg by mouth at bedtime as needed for sleep.   VITAMIN B-12 PO Take 1 tablet by mouth daily.        Follow-up Information     Cherre Robins, MD Follow up in 1 month(s).   Specialties: Vascular Surgery, Interventional Cardiology Why: The office will call the patient with an appointment Contact information: [address] [place] [address] [phone]                Allergies  Allergen Reactions   Ibuprofen Other (See Comments)    Blood in stool   Benadryl [Diphenhydramine] Anxiety   Sulfa Antibiotics Itching    Consultations: Vascular surgery   Procedures/Studies: CT Angio Chest/Abd/Pel for Dissection W and/or W/WO  Result Date: 10/17/2021 CLINICAL DATA:  Follow-up dissection. EXAM: CT ANGIOGRAPHY CHEST, ABDOMEN AND PELVIS TECHNIQUE: Non-contrast  CT of the chest was initially obtained. Multidetector CT imaging through the chest, abdomen and pelvis was performed using the standard protocol during bolus administration of intravenous contrast. Multiplanar reconstructed images and MIPs were obtained and reviewed to evaluate the vascular anatomy. RADIATION DOSE REDUCTION: This exam was performed according to the departmental dose-optimization program which includes automated exposure control, adjustment of the mA and/or kV according to patient size and/or use of iterative reconstruction technique. CONTRAST:  198mL OMNIPAQUE IOHEXOL 350 MG/ML SOLN COMPARISON:  CT angiogram chest 10/15/2021. FINDINGS: CTA CHEST FINDINGS Cardiovascular: Again seen is thoracic aortic dissection. The origin of the dissection flap occurs just distal to the takeoff of the left subclavian artery and appears similar in extent when compared to the prior study. The proximal descending aorta is dilated measuring up to 4.6 cm, unchanged. Mid descending aorta measures up to 3.9 cm and aorta at the level of the hiatus measures 3.9 cm. The true lumen throughout the descending thoracic aorta is patent, but narrowed (approximally 15-20%). There is a small amount of contrast within the false lumen. Origin of the great vessels demonstrates bovine arch anatomy. Origin of great  vessels appear normal. No central pulmonary embolism. Heart is borderline enlarged. No pericardial effusion. Mediastinum/Nodes: No enlarged mediastinal, hilar, or axillary lymph nodes. Thyroid gland, trachea, and esophagus demonstrate no significant findings. Lungs/Pleura: There is minimal atelectasis in the lower lobes. The lungs are otherwise clear. No pleural effusion or pneumothorax. Musculoskeletal: No chest wall abnormality. No acute or significant osseous findings. Review of the MIP images confirms the above findings. CTA ABDOMEN AND PELVIS FINDINGS VASCULAR Aorta: Borders of the aorta are difficult to visualize  secondary to technique and body habitus. Abdominal infrarenal abdominal aorta dilated measuring 3.0 x 3.5 cm. Proximal abdominal aorta dilated measuring 3.2 cm. Aortic dissection is seen throughout the abdominal aorta to the level of the bifurcation. The true lumen is patent, but small in diameter (proximally 15-20%). Celiac: From true lumen.  Patent and within normal limits. SMA: From true lumen.  Patent and within normal limits. Renals: Right renal artery and accessory right renal artery are small in caliber, but grossly patent arising from the true lumen. Left renal artery arises from false lumen and is nonopacified. IMA: Not opacified/visualized. Inflow: Right-sided within normal limits. Trace flow tapering to absent flow in the left common iliac artery. No flow identified in the left external or internal iliac arteries. Veins: No obvious venous abnormality within the limitations of this arterial phase study. Review of the MIP images confirms the above findings. NON-VASCULAR Hepatobiliary: No focal liver abnormality is seen. Status post cholecystectomy. No biliary dilatation. Pancreas: Unremarkable. No pancreatic ductal dilatation or surrounding inflammatory changes. Spleen: Normal in size without focal abnormality. Adrenals/Urinary Tract: Adrenal glands are not well visualized. Kidneys are not well delineated, no definite hydronephrosis. There is likely decreased enhancement throughout the left kidney. Bladder is within normal limits. Stomach/Bowel: Stomach is within normal limits. Appendix appears normal. No evidence of bowel wall thickening, distention, or inflammatory changes. There is diffuse gaseous distention of the colon. Appendix not visualized. Lymphatic: No enlarged lymph nodes are seen. Reproductive: Uterus within normal limits. Adnexa unremarkable. There is a surgical clip in the right adnexa. Other: No ascites or focal abdominal wall hernia. Musculoskeletal: No acute or significant osseous  findings. Review of the MIP images confirms the above findings. IMPRESSION: 1. Stanford type B/DeBakey type 3 B aortic dissection extends to the level of the iliac bifurcation. Stable aneurysmal dilatation of the proximal descending thoracic aorta measuring 4.6 cm. Aneurysmal dilatation of the abdominal aorta measuring up to 3.5 cm. The true lumen is patent, but small in size (approximally 15-20% of overall diameter). 2. Left renal artery and inferior mesenteric arteries are not opacified arising from the false lumen. 3. Severe attenuated flow in the left common iliac artery with absent flow in the left external iliac, internal iliac, and common femoral arteries. These results were called by telephone at the time of interpretation on 10/17/2021 at 5:11 pm to provider Jacky Kindle MD, who verbally acknowledged these results. Electronically Signed   By: Ronney Asters M.D.   On: 10/17/2021 17:11   ECHOCARDIOGRAM COMPLETE  Result Date: 10/16/2021    ECHOCARDIOGRAM REPORT   Patient Name:   KENNIDEE SIMONTON Date of Exam: 10/16/2021 Medical Rec #:  [id number]     Height:       63.0 in Accession #:    [id number]    Weight:       103.6 lb Date of Birth:  [birth year]     BSA:          1.462 m Patient Age:  55 years      BP:           90/52 mmHg Patient Gender: F             HR:           73 bpm. Exam Location:  Inpatient Procedure: 2D Echo, Cardiac Doppler and Color Doppler Indications:    Ascending aortic anneurysm  History:        Patient has no prior history of Echocardiogram examinations.  Sonographer:    Jefferey Pica Referring Phys: [id number] [place]  1. Left ventricular ejection fraction, by estimation, is >75%. The left ventricle has hyperdynamic function. The left ventricle has no regional wall motion abnormalities. There is severe concentric left ventricular hypertrophy. Left ventricular diastolic parameters are indeterminate.  2. Right ventricular systolic function is normal. The right ventricular  size is normal. There is normal pulmonary artery systolic pressure.  3. Left atrial size was mildly dilated.  4. The mitral valve is normal in structure. Trivial mitral valve regurgitation.  5. The aortic valve is normal in structure. Aortic valve regurgitation is not visualized.  6. The aortic dissection is not visualized on this echo. FINDINGS  Left Ventricle: Left ventricular ejection fraction, by estimation, is >75%. The left ventricle has hyperdynamic function. The left ventricle has no regional wall motion abnormalities. The left ventricular internal cavity size was normal in size. There is severe concentric left ventricular hypertrophy. Left ventricular diastolic parameters are indeterminate. Right Ventricle: The right ventricular size is normal. Right vetricular wall thickness was not well visualized. Right ventricular systolic function is normal. There is normal pulmonary artery systolic pressure. The tricuspid regurgitant velocity is 1.25 m/s, and with an assumed right atrial pressure of 5 mmHg, the estimated right ventricular systolic pressure is 123XX123 mmHg. Left Atrium: Left atrial size was mildly dilated. Right Atrium: Right atrial size was normal in size. Pericardium: There is no evidence of pericardial effusion. Mitral Valve: The mitral valve is normal in structure. Trivial mitral valve regurgitation. Tricuspid Valve: The tricuspid valve is normal in structure. Tricuspid valve regurgitation is trivial. Aortic Valve: The aortic valve is normal in structure. Aortic valve regurgitation is not visualized. Aortic valve peak gradient measures 10.3 mmHg. Pulmonic Valve: The pulmonic valve was normal in structure. Pulmonic valve regurgitation is not visualized. Aorta: The aortic dissection is not visualized on this echo. The aortic root and ascending aorta are structurally normal, with no evidence of dilitation. IAS/Shunts: The atrial septum is grossly normal.  LEFT VENTRICLE PLAX 2D LVIDd:         2.90 cm    Diastology LVIDs:         1.10 cm   LV e' lateral:   16.20 cm/s LV PW:         1.60 cm   LV E/e' lateral: 3.9 LV IVS:        2.00 cm LVOT diam:     1.90 cm LV SV:         73 LV SV Index:   50 LVOT Area:     2.84 cm  RIGHT VENTRICLE          IVC RV Basal diam:  2.50 cm  IVC diam: 0.80 cm TAPSE (M-mode): 2.1 cm LEFT ATRIUM             Index        RIGHT ATRIUM           Index LA diam:  3.10 cm 2.12 cm/m   RA Area:     10.40 cm LA Vol (A2C):   49.4 ml 33.79 ml/m  RA Volume:   19.20 ml  13.13 ml/m LA Vol (A4C):   51.3 ml 35.09 ml/m LA Biplane Vol: 52.7 ml 36.04 ml/m  AORTIC VALVE                 PULMONIC VALVE AV Area (Vmax): 2.70 cm     PV Vmax:       0.78 m/s AV Vmax:        160.50 cm/s  PV Peak grad:  2.4 mmHg AV Peak Grad:   10.3 mmHg LVOT Vmax:      153.00 cm/s LVOT Vmean:     95.900 cm/s LVOT VTI:       0.258 m  AORTA Ao Root diam: 3.20 cm Ao Asc diam:  3.30 cm MITRAL VALVE                TRICUSPID VALVE MV Area (PHT): 3.33 cm     TR Peak grad:   6.2 mmHg MV Decel Time: 228 msec     TR Vmax:        125.00 cm/s MV E velocity: 62.60 cm/s MV A velocity: 112.00 cm/s  SHUNTS MV E/A ratio:  0.56         Systemic VTI:  0.26 m                             Systemic Diam: 1.90 cm Mertie Moores MD Electronically signed by Mertie Moores MD Signature Date/Time: 10/16/2021/3:29:44 PM    Final    CT Angio Chest PE W/Cm &/Or Wo Cm  Result Date: 10/15/2021 CLINICAL DATA:  Chest pain with nausea and vomiting. EXAM: CT ANGIOGRAPHY CHEST WITH CONTRAST TECHNIQUE: Multidetector CT imaging of the chest was performed using the standard protocol during bolus administration of intravenous contrast. Multiplanar CT image reconstructions and MIPs were obtained to evaluate the vascular anatomy. RADIATION DOSE REDUCTION: This exam was performed according to the departmental dose-optimization program which includes automated exposure control, adjustment of the mA and/or kV according to patient size and/or use of iterative  reconstruction technique. CONTRAST:  63mL OMNIPAQUE IOHEXOL 350 MG/ML SOLN COMPARISON:  None Available. FINDINGS: Cardiovascular: There is dissection of a dilated distal aortic arch and descending thoracic aorta (approximately 4.7 cm in diameter. This dissection extends from the descending portion of the aortic arch into the visualized portion of the abdominal aorta. The true lumen of the thoracic aorta measures approximately 20% of the total aortic lumen. Satisfactory opacification of the pulmonary arteries to the segmental level. No evidence of pulmonary embolism. Normal heart size. No pericardial effusion. Mediastinum/Nodes: No enlarged mediastinal, hilar, or axillary lymph nodes. Thyroid gland, trachea, and esophagus demonstrate no significant findings. Lungs/Pleura: Lungs are clear. No pleural effusion or pneumothorax. Upper Abdomen: No acute abnormality. Musculoskeletal: Multilevel degenerative changes seen throughout the thoracic spine. Review of the MIP images confirms the above findings. 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 Further evaluation with CTA of the abdomen and pelvis is recommended to determine the extent of the dissection within the abdomen. Electronically Signed   By: Virgina Norfolk M.D.   On: 10/15/2021 17:50   DG Chest 2 View  Result Date: 10/15/2021 CLINICAL DATA:  Chest pain, weakness EXAM: CHEST -  2 VIEW COMPARISON:  08/30/2018 FINDINGS: The heart size and mediastinal contours are within normal limits. Both lungs are clear. Disc degenerative disease of the thoracic spine. IMPRESSION: No acute abnormality of the lungs. Electronically Signed   By: Delanna Ahmadi M.D.   On: 10/15/2021 14:02      Subjective: Patient seen and examined at the bedside this morning.  Hemodynamically stable for discharge.  Discharge Exam: Vitals:   10/21/21 0729  10/21/21 0954  BP: (!) 92/58 104/72  Pulse: 72 76  Resp: 19   Temp: 98.8 F (37.1 C)   SpO2: 100%    Vitals:   10/20/21 2314 10/21/21 0511 10/21/21 0729 10/21/21 0954  BP: 124/80 136/76 (!) 92/58 104/72  Pulse: 71 (!) 52 72 76  Resp: 16 16 19    Temp: 98.2 F (36.8 C) 98 F (36.7 C) 98.8 F (37.1 C)   TempSrc: Oral Oral Oral   SpO2: 100% 100% 100%   Weight:  49.6 kg    Height:        General: Pt is alert, awake, not in acute distress Cardiovascular: RRR, S1/S2 +, no rubs, no gallops Respiratory: CTA bilaterally, no wheezing, no rhonchi Abdominal: Soft, NT, ND, bowel sounds + Extremities: no edema, no cyanosis    The results of significant diagnostics from this hospitalization (including imaging, microbiology, ancillary and laboratory) are listed below for reference.     Microbiology: Recent Results (from the past 240 hour(s))  Resp Panel by RT-PCR (Flu A&B, Covid) Anterior Nasal Swab     Status: None   Collection Time: 10/12/21 12:45 PM   Specimen: Anterior Nasal Swab  Result Value Ref Range Status   SARS Coronavirus 2 by RT PCR NEGATIVE NEGATIVE Final    Comment: (NOTE) SARS-CoV-2 target nucleic acids are NOT DETECTED.  The SARS-CoV-2 RNA is generally detectable in upper respiratory specimens during the acute phase of infection. The lowest concentration of SARS-CoV-2 viral copies this assay can detect is 138 copies/mL. A negative result does not preclude SARS-Cov-2 infection and should not be used as the sole basis for treatment or other patient management decisions. A negative result may occur with  improper specimen collection/handling, submission of specimen other than nasopharyngeal swab, presence of viral mutation(s) within the areas targeted by this assay, and inadequate number of viral copies(<138 copies/mL). A negative result must be combined with clinical observations, patient history, and epidemiological information. The expected result is  Negative.  Fact Sheet for Patients:  EntrepreneurPulse.com.au  Fact Sheet for Healthcare Providers:  IncredibleEmployment.be  This test is no t yet approved or cleared by the [place] FDA and  has been authorized for detection and/or diagnosis of SARS-CoV-2 by FDA under an Emergency Use Authorization (EUA). This EUA will remain  in effect (meaning this test can be used) for the duration of the COVID-19 declaration under Section 564(b)(1) of the Act, 21 U.S.C.section 360bbb-3(b)(1), unless the authorization is terminated  or revoked sooner.       Influenza A by PCR NEGATIVE NEGATIVE Final   Influenza B by PCR NEGATIVE NEGATIVE Final    Comment: (NOTE) The Xpert Xpress SARS-CoV-2/FLU/RSV plus assay is intended as an aid in the diagnosis of influenza from Nasopharyngeal swab specimens and should not be used as a sole basis for treatment. Nasal washings and aspirates are unacceptable for Xpert Xpress SARS-CoV-2/FLU/RSV testing.  Fact Sheet for Patients: EntrepreneurPulse.com.au  Fact Sheet for Healthcare Providers: IncredibleEmployment.be  This test is not yet approved or cleared by the [place]  FDA and has been authorized for detection and/or diagnosis of SARS-CoV-2 by FDA under an Emergency Use Authorization (EUA). This EUA will remain in effect (meaning this test can be used) for the duration of the COVID-19 declaration under Section 564(b)(1) of the Act, 21 U.S.C. section 360bbb-3(b)(1), unless the authorization is terminated or revoked.  Performed at [facility], [address] [place], [place] [address]   MRSA Next Gen by PCR, Nasal     Status: None   Collection Time: 10/15/21  8:23 PM   Specimen: Nasal Mucosa; Nasal Swab  Result Value Ref Range Status   MRSA by PCR Next Gen NOT DETECTED NOT DETECTED Final    Comment: (NOTE) The GeneXpert MRSA Assay (FDA approved for NASAL  specimens only), is one component of a comprehensive MRSA colonization surveillance program. It is not intended to diagnose MRSA infection nor to guide or monitor treatment for MRSA infections. Test performance is not FDA approved in patients less than 21 years old. Performed at [facility], [address] [place], [place] [address]      Labs: BNP (last 3 results) No results for input(s): "BNP" in the last 8760 hours. Basic Metabolic Panel: Recent Labs  Lab 10/15/21 1332 10/16/21 0500 10/17/21 0319 10/18/21 0935 10/19/21 0745  NA 137 138 141 142 141  K 3.8 3.4* 3.7 3.6 3.5  CL 99 104 112* 111 109  CO2 27 25 23 26 26   GLUCOSE 106* 130* 100* 80 90  BUN 22* 18 13 9 9   CREATININE 1.45* 1.23* 1.22* 1.42* 1.47*  CALCIUM 9.3 8.7* 8.5* 8.5* 8.5*  MG  --  2.2  --  2.0  --   PHOS  --  3.2  --  2.5  --    Liver Function Tests: Recent Labs  Lab 10/15/21 1332  AST 22  ALT 20  ALKPHOS 52  BILITOT 0.9  PROT 6.4*  ALBUMIN 3.9   No results for input(s): "LIPASE", "AMYLASE" in the last 168 hours. No results for input(s): "AMMONIA" in the last 168 hours. CBC: Recent Labs  Lab 10/15/21 1332 10/16/21 0500 10/17/21 0319  WBC 7.2 7.6 5.0  NEUTROABS 5.1  --  2.8  HGB 14.3 13.6 11.6*  HCT 44.0 40.1 34.2*  MCV 86.3 84.4 84.7  PLT 303 263 199   Cardiac Enzymes: No results for input(s): "CKTOTAL", "CKMB", "CKMBINDEX", "TROPONINI" in the last 168 hours. BNP: Invalid input(s): "POCBNP" CBG: Recent Labs  Lab 10/15/21 2021  GLUCAP 104*   D-Dimer No results for input(s): "DDIMER" in the last 72 hours. Hgb A1c No results for input(s): "HGBA1C" in the last 72 hours. Lipid Profile No results for input(s): "CHOL", "HDL", "LDLCALC", "TRIG", "CHOLHDL", "LDLDIRECT" in the last 72 hours. Thyroid function studies No results for input(s): "TSH", "T4TOTAL", "T3FREE", "THYROIDAB" in the last 72 hours.  Invalid input(s): "FREET3" Anemia work up No results for input(s):  "VITAMINB12", "FOLATE", "FERRITIN", "TIBC", "IRON", "RETICCTPCT" in the last 72 hours. Urinalysis    Component Value Date/Time   COLORURINE YELLOW 08/30/2018 1541   APPEARANCEUR HAZY (A) 08/30/2018 1541   LABSPEC 1.008 08/30/2018 1541   PHURINE 8.0 08/30/2018 1541   GLUCOSEU NEGATIVE 08/30/2018 1541   HGBUR NEGATIVE 08/30/2018 1541   BILIRUBINUR NEGATIVE 08/30/2018 1541   KETONESUR 20 (A) 08/30/2018 1541   PROTEINUR NEGATIVE 08/30/2018 1541   UROBILINOGEN 0.2 12/31/2014 1649   NITRITE NEGATIVE 08/30/2018 1541   LEUKOCYTESUR NEGATIVE 08/30/2018 1541   Sepsis Labs Recent Labs  Lab  10/15/21 1332 10/16/21 0500 10/17/21 0319  WBC 7.2 7.6 5.0   Microbiology Recent Results (from the past 240 hour(s))  Resp Panel by RT-PCR (Flu A&B, Covid) Anterior Nasal Swab     Status: None   Collection Time: 10/12/21 12:45 PM   Specimen: Anterior Nasal Swab  Result Value Ref Range Status   SARS Coronavirus 2 by RT PCR NEGATIVE NEGATIVE Final    Comment: (NOTE) SARS-CoV-2 target nucleic acids are NOT DETECTED.  The SARS-CoV-2 RNA is generally detectable in upper respiratory specimens during the acute phase of infection. The lowest concentration of SARS-CoV-2 viral copies this assay can detect is 138 copies/mL. A negative result does not preclude SARS-Cov-2 infection and should not be used as the sole basis for treatment or other patient management decisions. A negative result may occur with  improper specimen collection/handling, submission of specimen other than nasopharyngeal swab, presence of viral mutation(s) within the areas targeted by this assay, and inadequate number of viral copies(<138 copies/mL). A negative result must be combined with clinical observations, patient history, and epidemiological information. The expected result is Negative.  Fact Sheet for Patients:  BloggerCourse.com  Fact Sheet for Healthcare Providers:   SeriousBroker.it  This test is no t yet approved or cleared by the [place] FDA and  has been authorized for detection and/or diagnosis of SARS-CoV-2 by FDA under an Emergency Use Authorization (EUA). This EUA will remain  in effect (meaning this test can be used) for the duration of the COVID-19 declaration under Section 564(b)(1) of the Act, 21 U.S.C.section 360bbb-3(b)(1), unless the authorization is terminated  or revoked sooner.       Influenza A by PCR NEGATIVE NEGATIVE Final   Influenza B by PCR NEGATIVE NEGATIVE Final    Comment: (NOTE) The Xpert Xpress SARS-CoV-2/FLU/RSV plus assay is intended as an aid in the diagnosis of influenza from Nasopharyngeal swab specimens and should not be used as a sole basis for treatment. Nasal washings and aspirates are unacceptable for Xpert Xpress SARS-CoV-2/FLU/RSV testing.  Fact Sheet for Patients: BloggerCourse.com  Fact Sheet for Healthcare Providers: SeriousBroker.it  This test is not yet approved or cleared by the [place] FDA and has been authorized for detection and/or diagnosis of SARS-CoV-2 by FDA under an Emergency Use Authorization (EUA). This EUA will remain in effect (meaning this test can be used) for the duration of the COVID-19 declaration under Section 564(b)(1) of the Act, 21 U.S.C. section 360bbb-3(b)(1), unless the authorization is terminated or revoked.  Performed at [facility], [address] [place], [place] [address]   MRSA Next Gen by PCR, Nasal     Status: None   Collection Time: 10/15/21  8:23 PM   Specimen: Nasal Mucosa; Nasal Swab  Result Value Ref Range Status   MRSA by PCR Next Gen NOT DETECTED NOT DETECTED Final    Comment: (NOTE) The GeneXpert MRSA Assay (FDA approved for NASAL specimens only), is one component of a comprehensive MRSA colonization surveillance program. It is not intended to diagnose  MRSA infection nor to guide or monitor treatment for MRSA infections. Test performance is not FDA approved in patients less than 99 years old. Performed at [facility], [address], [place], [place] [address]     Please note: You were cared for by a hospitalist during your hospital stay. Once you are discharged, your primary care physician will handle any further medical issues. Please note that NO REFILLS for any discharge medications will be authorized  once you are discharged, as it is imperative that you return to your primary care physician (or establish a relationship with a primary care physician if you do not have one) for your post hospital discharge needs so that they can reassess your need for medications and monitor your lab values.    Time coordinating discharge: 40 minutes  SIGNED:   Shelly Coss, MD  [facility] 10/21/2021, 10:09 AM Pager [phone]  If 7PM-7AM, please contact night-coverage [email or web address] Password [email or web address]

## 2021-10-21 NOTE — Assessment & Plan Note (Signed)
  #)   Chronic tobacco abuse: Per chart review, patient has had history of chronic tobacco abuse, most recently documented as use of 1/3 pack/day.  Notable in the setting of her thoracic aortic dissection, as above.  She is prescribed a daily nicotine patch as an outpatient.  Plan: Resume home schedule nicotine patch.  Anticipate additional counseling as patient's mental status improves.

## 2021-10-21 NOTE — Assessment & Plan Note (Signed)
  #)   Unintentional overdose: In the setting of report of use of heroin in the interval following discharge from the hospital earlier today leading up to presenting to drug rehabilitation facility, while noting a updated urinary drug screen is positive for opiates, benzodiazepines, and cocaine.  She has received a single dose of IV Narcan in the emergency department approximately 3 hours ago, and has not required initiation of additional doses of Narcan or any initiation of Narcan drip thus far.  Her initial hypotension has improved following interval 1 L bolus of normal saline.  Additionally, pt appears to be maintaining her airway, without any overt evidence of hypoventilation, and is maintaining oxygen saturations in the high 90s to 100% on room air.  Plan: Prn Narcan.  Continuous lactated Ringer's, as above.  Monitor continuous pulse symmetry.  Monitor on telemetry.  Repeat CMP in the morning.  Fall precautions ordered.  Seizure precautions ordered.  Consult placed with transition of care team, as above.  Check VBG, as above.

## 2021-10-21 NOTE — Progress Notes (Signed)
Discharge education complete. Pt awaiting  TOC med delivery. IV and telemetry removed, CCMD notified.  Pt expecting mother to arrive in about an hour for ride to rehab.

## 2021-10-21 NOTE — Progress Notes (Signed)
Pt with known type B dissection. Discharged today with plans for rehab, however stopped and snorted heroin requiring EMS and Narcan.  Labile BP on arrival.  Denies chest, back, or abd pain.  Palpable pulses.  AKI - likely from hypotension. Recommend trending and hydrating. Pt with single -kidney perfusion due to aortic dissection. Significant AKI would require renal ultrasound, possible CT to assess for progression. Being the the pt is asymptomatic otherwise, we will trend.   Victorino Sparrow  Vascular Surgery

## 2021-10-21 NOTE — Assessment & Plan Note (Signed)
  #)   Acute on chronic anemia: In setting of apparent chronic anemia associated baseline hemoglobin 11-13, presenting hemoglobin slightly lower at 10.3, subsequent normocytic/hemoglobin as well as nonelevated RDW.  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  No evidence of overt active bleed at this time.  Of note, the patient is on scheduled daily oral iron supplementation as an outpatient.  Plan: Add on iron studies.  Check INR.  Repeat CBC in the morning.  SCDs.

## 2021-10-21 NOTE — Assessment & Plan Note (Signed)
 #)   Acute toxic encephalopathy: Patient presents with new onset somnolence associated with diminished responsiveness starting earlier today after reported recreational drug use, including that of heroin, as further detailed above.  Suspect the patient's acute encephalopathy is likely multifactorial, with suspected metabolic contributions superimposed on aforementioned toxic, noting likely contribution from AKI superimposed on CKD 3A.  No evidence of underlying factious process at this time, including urinalysis that was inconsistent with UTI, while chest x-ray shows no evidence of acute cardiopulmonary process, as above.  Of note, TSH is mildly elevated.  Will add on free T4 for additional correlation/qualification.  We will also check VBG to evaluate for any contribution from hypercapnic encephalopathy. No additional overt metabolic/electrolyte contributions at this time.  No overt acute focal neurologic deficits to suggest a contribution from an underlying acute CVA. Seizures are also felt to be less likely, although this patient is at increased risk for the latter, particular given her reported recreational drug use/presenting UDS findings.    Plan: fall precautions. Repeat CMP/CBC in the AM.  check VBG.  Add on free T4, as above.  Seizure precautions ordered.  Hold outpatient prn trazodone and as needed Robaxin for now.  Transition of care consult in the setting of recurrent polysubstance abuse.  Add on CPK level.  Further evaluation management of AKI on suspected CKD 3A.

## 2021-10-21 NOTE — ED Provider Triage Note (Signed)
Emergency Medicine Provider Triage Evaluation Note  [name] , a 56 y.o. female  was evaluated in triage.  Pt complains of fatigue. 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  Was discharge today but became disoriented, slurring of speech and syncopized.  Staff noted low blood pressure  Review of Systems  Positive: As above Negative: As above  Physical Exam  BP (!) 83/64 (BP Location: Right Arm)   Pulse 70   Temp 98.7 F (37.1 C) (Oral)   Resp 16   SpO2 97%  Gen:    Drowsy, hard to arouse Resp:  Normal effort  MSK:   Arms flacid Other:    Medical Decision Making  Medically screening exam initiated at 4:11 PM.  Appropriate orders placed.  Hollie Wojahn was informed that the remainder of the evaluation will be completed by another provider, this initial triage assessment does not replace that evaluation, and the importance of remaining in the ED until their evaluation is complete.  Pt has stanford Type B aortic dissection, hospitalized and released today with medical management.  Is now altered, hypotensive.  Concerns for worsening dissection vs. Medication induced hypotension.    Charge nurse notified, pt will need to be seen in the main room promptly.  BP equal bilaterally.    Fayrene Helper, PA-C 10/21/21 1616

## 2021-10-21 NOTE — ED Provider Notes (Signed)
[facility] EMERGENCY DEPARTMENT Provider Note   CSN: [id number] Arrival date & time: 10/21/21  1558     History  Chief Complaint  Patient presents with   Altered Mental Status   Hypotension    [name] is a 56 y.o. female.   Altered Mental Status Presenting symptoms: confusion   Associated symptoms: abdominal pain   Patient presents for altered mental status.  Medical history includes polysubstance use, HLD, HTN, aortic dissection.  She was admitted to the hospital 1 week ago for chest pain secondary to a type B aortic dissection.  She was medically managed.  She was discharged this morning.  Blood pressure medications at time of discharge were clonidine and labetalol.  Patient's mother picked her up from the hospital at around noon.  She was scheduled to check into a rehab facility in [facility].  They stopped at [facility].  Patient's mother is concerned that the patient significant other may have given her some drugs.  Patient subsequently became altered.  She was brought to the ED for altered mental status.  Currently, patient endorses some mild abdominal pain but denies any other current symptoms.     Home Medications Prior to Admission medications   Medication Sig Start Date End Date Taking? Authorizing Provider  acetaminophen (TYLENOL) 650 MG CR tablet Take 650 mg by mouth every 6 (six) hours as needed for pain.    [provider]  alum & mag hydroxide-simeth (MAALOX/MYLANTA) 200-200-20 MG/5ML suspension Take 30 mLs by mouth every 4 (four) hours as needed for indigestion.    [provider]  cloNIDine (CATAPRES) 0.2 MG tablet Take 1 tablet (0.2 mg total) by mouth 3 (three) times daily. 10/21/21   Burnadette Pop, MD  Cyanocobalamin (VITAMIN B-12 PO) Take 1 tablet by mouth daily.    [provider]  dicyclomine (BENTYL) 20 MG tablet Take 20 mg by mouth every 6 (six) hours as needed for spasms.    [provider]  Ferrous  Sulfate (IRON PO) Take 1 tablet by mouth daily.    [provider]  hydrOXYzine (ATARAX) 25 MG tablet Take 25 mg by mouth every 6 (six) hours as needed for anxiety.    [provider]  labetalol (NORMODYNE) 300 MG tablet Take 1 tablet (300 mg total) by mouth 3 (three) times daily. 10/21/21   Burnadette Pop, MD  loperamide (IMODIUM) 2 MG capsule Take 2-4 mg by mouth as needed for diarrhea or loose stools.    [provider]  Magnesium Hydroxide (MILK OF MAGNESIA PO) Take 30 mLs by mouth daily as needed (constipation).    [provider]  methocarbamol (ROBAXIN) 500 MG tablet Take 500 mg by mouth every 8 (eight) hours as needed for muscle spasms.    [provider]  Multiple Vitamin (MULTIVITAMIN WITH MINERALS) TABS tablet Take 1 tablet by mouth daily.    [provider]  Naproxen Sod-diphenhydrAMINE (ALEVE PM) 220-25 MG TABS Take 2 tablets by mouth at bedtime as needed (For sleep or pain).    [provider]  nicotine (NICODERM CQ - DOSED IN MG/24 HOURS) 14 mg/24hr patch Place 1 patch (14 mg total) onto the skin daily. 10/22/21   Burnadette Pop, MD  ondansetron (ZOFRAN-ODT) 4 MG disintegrating tablet Take 4 mg by mouth every 6 (six) hours as needed for nausea or vomiting.    [provider]  Probiotic Product (PROBIOTIC PO) Take 1 tablet by mouth daily.    [provider]  thiamine 100 MG tablet Take 1 tablet (100 mg total) by mouth daily. 10/22/21   Burnadette Pop, MD  traZODone (DESYREL) 50 MG tablet Take 50 mg by mouth at bedtime as needed for sleep.    [provider]      Allergies    Ibuprofen, Benadryl [diphenhydramine], and Sulfa antibiotics    Review of Systems   Review of Systems  Constitutional:  Positive for fatigue.  Gastrointestinal:  Positive for abdominal pain.  Psychiatric/Behavioral:  Positive for confusion.   All other systems reviewed and are negative.   Physical Exam Updated Vital  Signs BP 91/61   Pulse 68   Temp 98.7 F (37.1 C) (Oral)   Resp 14   Ht 5\' 3"  (1.6 m)   Wt 19.4 kg   SpO2 98%   BMI 7.57 kg/m  Physical Exam Vitals and nursing note reviewed.  Constitutional:      General: She is not in acute distress.    Appearance: She is well-developed. She is ill-appearing. She is not toxic-appearing or diaphoretic.  HENT:     Head: Normocephalic and atraumatic.     Right Ear: External ear normal.     Left Ear: External ear normal.     Nose: Nose normal.     Mouth/Throat:     Mouth: Mucous membranes are moist.     Pharynx: Oropharynx is clear.  Eyes:     General: No scleral icterus.    Conjunctiva/sclera: Conjunctivae normal.  Cardiovascular:     Rate and Rhythm: Normal rate and regular rhythm.     Heart sounds: No murmur heard. Pulmonary:     Effort: Pulmonary effort is normal. No respiratory distress.     Breath sounds: Normal breath sounds. No wheezing or rales.  Chest:     Chest wall: No tenderness.  Abdominal:     General: There is no distension.     Palpations: Abdomen is soft.     Tenderness: There is no abdominal tenderness.  Musculoskeletal:        General: No swelling. Normal range of motion.     Cervical back: Normal range of motion and neck supple. No rigidity.     Right lower leg: No edema.     Left lower leg: No edema.  Skin:    General: Skin is warm and dry.     Capillary Refill: Capillary refill takes less than 2 seconds.     Coloration: Skin is not jaundiced or pale.  Neurological:     General: No focal deficit present.     Mental Status: She is disoriented.     Cranial Nerves: No cranial nerve deficit.     Sensory: No sensory deficit.     Motor: No weakness.     Coordination: Coordination normal.  Psychiatric:     Comments: Somnolent     ED Results / Procedures / Treatments   Labs (all labs ordered are listed, but only abnormal results are displayed) Labs Reviewed  BASIC METABOLIC PANEL - Abnormal; Notable for the  following components:      Result Value   Glucose, Bld 110 (*)    Creatinine, Ser 1.98 (*)    GFR, Estimated 29 (*)    All other components within normal limits  CBC - Abnormal; Notable for the following components:   RBC 3.70 (*)    Hemoglobin 10.3 (*)    HCT 33.7 (*)    All other components within normal limits  URINALYSIS, ROUTINE W REFLEX  MICROSCOPIC - Abnormal; Notable for the following components:   Specific Gravity, Urine 1.004 (*)    Leukocytes,Ua SMALL (*)    Bacteria, UA RARE (*)    All other components within normal limits  HEPATIC FUNCTION PANEL - Abnormal; Notable for the following components:   Total Protein 5.9 (*)    Total Bilirubin 0.2 (*)    All other components within normal limits  RAPID URINE DRUG SCREEN, HOSP PERFORMED - Abnormal; Notable for the following components:   Opiates POSITIVE (*)    Cocaine POSITIVE (*)    Benzodiazepines POSITIVE (*)    All other components within normal limits  TSH - Abnormal; Notable for the following components:   TSH 5.958 (*)    All other components within normal limits  CBG MONITORING, ED - Abnormal; Notable for the following components:   Glucose-Capillary 112 (*)    All other components within normal limits  I-STAT CHEM 8, ED - Abnormal; Notable for the following components:   Potassium 5.4 (*)    Creatinine, Ser 2.10 (*)    Calcium, Ion 0.92 (*)    Hemoglobin 10.5 (*)    HCT 31.0 (*)    All other components within normal limits  TROPONIN I (HIGH SENSITIVITY) - Abnormal; Notable for the following components:   Troponin I (High Sensitivity) 27 (*)    All other components within normal limits  TROPONIN I (HIGH SENSITIVITY) - Abnormal; Notable for the following components:   Troponin I (High Sensitivity) 27 (*)    All other components within normal limits  MAGNESIUM  I-STAT BETA HCG BLOOD, ED ([facility], [facility], [facility] ONLY)    EKG EKG Interpretation  Date/Time:  Wednesday October 21 2021 16:11:06 EDT Ventricular Rate:  68 PR  Interval:  170 QRS Duration: 80 QT Interval:  448 QTC Calculation: 476 R Axis:   91 Text Interpretation: Normal sinus rhythm Rightward axis Borderline ECG Confirmed by Gloris Manchester [phone]) on 10/21/2021 4:44:08 PM  Radiology DG Chest Port 1 View  Result Date: 10/21/2021 CLINICAL DATA:  concerns of dissection EXAM: PORTABLE CHEST 1 VIEW COMPARISON:  Radiograph 10/15/2021 FINDINGS: Unchanged cardiomediastinal silhouette with widened mediastinum due to known aortic dissection. No new airspace disease. No pleural effusion. No pneumothorax. No acute osseous abnormality. IMPRESSION: Unchanged widened mediastinum due to known aortic dissection. No new airspace disease. Electronically Signed   By: Caprice Renshaw M.D.   On: 10/21/2021 16:45    Procedures Procedures    Medications Ordered in ED Medications  lactated ringers infusion (has no administration in time range)  sodium chloride 0.9 % bolus 1,000 mL (0 mLs Intravenous Stopped 10/21/21 1726)  naloxone (NARCAN) injection 0.4 mg (0.4 mg Intravenous Given 10/21/21 1625)    ED Course/ Medical Decision Making/ A&P                           Medical Decision Making Amount and/or Complexity of Data Reviewed Labs: ordered.  Risk Prescription drug management.   This patient presents to the ED for concern of altered mental status, this involves an extensive number of treatment options, and is a complaint that carries with it a high risk of complications and morbidity.  The differential diagnosis includes extension of dissection, CVA, drug overdose, polypharmacy, hypoglycemia   Co morbidities that complicate the patient evaluation  polysubstance use, HLD, HTN, aortic dissection   Additional history obtained:  Additional history obtained from patient's mother External records from outside source obtained and  reviewed including EMR   Lab Tests:  I Ordered, and personally interpreted labs.  The pertinent results include: AKI, downtrending  hemoglobin, mild stable elevation in troponin, UDS positive for opiates, cocaine, and benzodiazepines   Imaging Studies ordered:  I ordered imaging studies including chest x-ray I independently visualized and interpreted imaging which showed unchanged widened mediastinum when compared to prior imaging, no acute findings I agree with the radiologist interpretation   Cardiac Monitoring: / EKG:  The patient was maintained on a cardiac monitor.  I personally viewed and interpreted the cardiac monitored which showed an underlying rhythm of: Sinus rhythm   Consultations Obtained:  I requested consultation with the vascular surgeon, Dr. Karin Lieuobins,  and discussed lab and imaging findings as well as pertinent plan - they recommend: Admission overnight for hydration and trending of kidney function   Problem List / ED Course / Critical interventions / Medication management  Patient is a 56 year old female presenting for altered mental status.  Medical history is notable for a hospital discharge over the past week for a type B aortic dissection.  She was discharged from the hospital this morning.  Her mother picked her up at noon.  They reportedly in route to [place] rehab facility [place] [place] [place].  They did stop at [facility].  At that point, patient came altered.  She arrives in the ED via POV.  In triage, patient was found to have hypotension with blood pressures in the range of 60s over 50.  She was brought immediately to ED bed.  She was placed on bedside cardiac monitor.  IV fluids were initiated.  At this time, patient is somnolent.  He is able to answer basic questions.  She endorses some mild abdominal pain but denies any other physical symptoms other than fatigue.  She initially denies any drug use since discharge.  Patient was given 0.4 mg of Narcan with full return to normal mentation.  At this point, she is questioning why she is back in the hospital.  She has appears to be amnestic to arrival.  Patient was  kept on bedside cardiac monitor.  Lab work up was initiated.  EKG shows no acute changes.  With her regain and normalization, patient's blood pressure also improved.  She had no recurrence of somnolence or hypotension.  She was able to eat.  She does endorse heroin use at sheets.  She is tearful and remorseful.  Lab work shows an AKI.  During her recent hospitalization, patient had a creatinine of 1.4.  It is 1.98 today.  Due to her dissection, patient likely currently operating with only 1 kidney.  I spoke with vascular surgeon, Dr. Karin Lieuobins, about this new lab finding.  Vascular surgery came and visited the patient at bedside.  They do recommend admission overnight for continued observation, hydration, and trending of kidney function.  Patient was admitted to hospitalist for further management. I ordered medication including Narcan for opiate narcosis Reevaluation of the patient after these medicines showed that the patient resolved I have reviewed the patients home medicines and have made adjustments as needed   Social Determinants of Health:  History of polysubstance abuse  CRITICAL CARE Performed by: Gloris Manchesteryan Anquanette Bahner   Total critical care time: 35 minutes  Critical care time was exclusive of separately billable procedures and treating other patients.  Critical care was necessary to treat or prevent imminent or life-threatening deterioration.  Critical care was time spent personally by me on the following activities: development of treatment plan with  patient and/or surrogate as well as nursing, discussions with consultants, evaluation of patient's response to treatment, examination of patient, obtaining history from patient or surrogate, ordering and performing treatments and interventions, ordering and review of laboratory studies, ordering and review of radiographic studies, pulse oximetry and re-evaluation of patient's condition.         Final Clinical Impression(s) / ED Diagnoses Final  diagnoses:  Narcotic overdose, accidental or unintentional, initial encounter [facility])  AKI (acute kidney injury) [facility])    Rx / DC Orders ED Discharge Orders     None         Gloris Manchester, MD 10/21/21 2011

## 2021-10-21 NOTE — Progress Notes (Addendum)
TOC CSW spoke with Tiffany at [employer].  Tiffany will notify Claris Pong of pts update.   Follow up with [facility] if warranted for dc tomorrow (10/22/2021).  Beryle Bagsby Tarpley-Carter, MSW, LCSW-A Pronouns:  She/Her/Hers [facility] HealthTransitions of Care Clinical Social Worker Direct Number:  [phone] [email or web address]

## 2021-10-21 NOTE — ED Notes (Signed)
ED TO INPATIENT HANDOFF REPORT  ED Nurse Name and Phone #: Wilba Mutz [phone]  S Name/Age/Gender [name] 57 y.o. female Room/Bed: 012C/012C  Code Status   Code Status: Full Code  Home/SNF/Other Home Patient oriented to: self, place, time, and situation Is this baseline? Yes   Triage Complete: Triage complete  Chief Complaint AKI (acute kidney injury) (HCC) [N17.9]  Triage Note The [ts bp on both arms are in the seventies.  She is hardly able to answer questions she was just discharged from this hospital earlier today pts mother is at    the bedside  she reports that   the pt was admitted for a tear in her blood vessell  other than that she is unclear on some facts.  The pt is perspiring    Allergies Allergies  Allergen Reactions   Ibuprofen Other (See Comments)    Blood in stool   Benadryl [Diphenhydramine] Anxiety   Sulfa Antibiotics Itching    Level of Care/Admitting Diagnosis ED Disposition     ED Disposition  Admit   Condition  --   Comment  Hospital Area: [facility] [[id number]]  Level of Care: Progressive [102]  Admit to Progressive based on following criteria: MULTISYSTEM THREATS such as stable sepsis, metabolic/electrolyte imbalance with or without encephalopathy that is responding to early treatment.  May admit patient to [facility] or [facility] if equivalent level of care is available:: No  Covid Evaluation: Asymptomatic - no recent exposure (last 10 days) testing not required  Diagnosis: AKI (acute kidney injury) [facility]) [[id number]]  Admitting Physician: Angie Fava [[id number]]  Attending Physician: Angie Fava [[id number]]  Estimated length of stay: past midnight tomorrow  Certification:: I certify this patient will need inpatient services for at least 2 midnights          B Medical/Surgery History Past Medical History:  Diagnosis Date   Aortic dissection (HCC)    Carpal tunnel syndrome    Hypercholesteremia     Hypertension    Past Surgical History:  Procedure Laterality Date   CARPAL TUNNEL RELEASE     CESAREAN SECTION     CHOLECYSTECTOMY       A IV Location/Drains/Wounds Patient Lines/Drains/Airways Status     Active Line/Drains/Airways     Name Placement date Placement time Site Days   Peripheral IV 10/21/21 18 G Left Antecubital 10/21/21  1626  Antecubital  less than 1            Intake/Output Last 24 hours  Intake/Output Summary (Last 24 hours) at 10/21/2021 2202 Last data filed at 10/21/2021 1726 Gross per 24 hour  Intake 999 ml  Output --  Net 999 ml    Labs/Imaging Results for orders placed or performed during the hospital encounter of 10/21/21 (from the past 48 hour(s))  Basic metabolic panel     Status: Abnormal   Collection Time: 10/21/21  4:18 PM  Result Value Ref Range   Sodium 139 135 - 145 mmol/L   Potassium 4.3 3.5 - 5.1 mmol/L   Chloride 106 98 - 111 mmol/L   CO2 27 22 - 32 mmol/L   Glucose, Bld 110 (H) 70 - 99 mg/dL    Comment: Glucose reference range applies only to samples taken after fasting for at least 8 hours.   BUN 13 6 - 20 mg/dL   Creatinine, Ser 4.00 (H) 0.44 - 1.00 mg/dL   Calcium 9.1 8.9 - 86.7 mg/dL   GFR, Estimated 29 (L) >60  mL/min    Comment: (NOTE) Calculated using the CKD-EPI Creatinine Equation (2021)    Anion gap 6 5 - 15    Comment: Performed at [facility], [place] [address] [place], [place] [address]  CBC     Status: Abnormal   Collection Time: 10/21/21  4:18 PM  Result Value Ref Range   WBC 5.1 4.0 - 10.5 K/uL   RBC 3.70 (L) 3.87 - 5.11 MIL/uL   Hemoglobin 10.3 (L) 12.0 - 15.0 g/dL   HCT 33.7 (L) 36.0 - 46.0 %   MCV 91.1 80.0 - 100.0 fL   MCH 27.8 26.0 - 34.0 pg   MCHC 30.6 30.0 - 36.0 g/dL   RDW 14.0 11.5 - 15.5 %   Platelets 248 150 - 400 K/uL   nRBC 0.0 0.0 - 0.2 %    Comment: Performed at [facility], [place] [address], [place], [place] [address]  Troponin I (High Sensitivity)     Status: Abnormal    Collection Time: 10/21/21  4:18 PM  Result Value Ref Range   Troponin I (High Sensitivity) 27 (H) <18 ng/L    Comment: (NOTE) Elevated high sensitivity troponin I (hsTnI) values and significant  changes across serial measurements may suggest ACS but many other  chronic and acute conditions are known to elevate hsTnI results.  Refer to the Links section for chest pain algorithms and additional  guidance. Performed at [facility], [place] [address] [place], [place] [address]   Hepatic function panel     Status: Abnormal   Collection Time: 10/21/21  4:18 PM  Result Value Ref Range   Total Protein 5.9 (L) 6.5 - 8.1 g/dL   Albumin 3.6 3.5 - 5.0 g/dL   AST 32 15 - 41 U/L   ALT 31 0 - 44 U/L   Alkaline Phosphatase 49 38 - 126 U/L   Total Bilirubin 0.2 (L) 0.3 - 1.2 mg/dL   Bilirubin, Direct <0.1 0.0 - 0.2 mg/dL   Indirect Bilirubin NOT CALCULATED 0.3 - 0.9 mg/dL    Comment: Performed at [facility] [address], [place], [place] [address]  Magnesium     Status: None   Collection Time: 10/21/21  4:18 PM  Result Value Ref Range   Magnesium 2.4 1.7 - 2.4 mg/dL    Comment: Performed at [place] [address] [place], [place] [address]  CBG monitoring, ED     Status: Abnormal   Collection Time: 10/21/21  4:20 PM  Result Value Ref Range   Glucose-Capillary 112 (H) 70 - 99 mg/dL    Comment: Glucose reference range applies only to samples taken after fasting for at least 8 hours.  Urinalysis, Routine w reflex microscopic Urine, Clean Catch     Status: Abnormal   Collection Time: 10/21/21  4:29 PM  Result Value Ref Range   Color, Urine YELLOW YELLOW   APPearance CLEAR CLEAR   Specific Gravity, Urine 1.004 (L) 1.005 - 1.030   pH 7.0 5.0 - 8.0   Glucose, UA NEGATIVE NEGATIVE mg/dL   Hgb urine dipstick NEGATIVE NEGATIVE   Bilirubin Urine NEGATIVE NEGATIVE   Ketones, ur NEGATIVE NEGATIVE mg/dL   Protein, ur NEGATIVE NEGATIVE mg/dL   Nitrite NEGATIVE NEGATIVE    Leukocytes,Ua SMALL (A) NEGATIVE   RBC / HPF 0-5 0 - 5 RBC/hpf   WBC, UA 0-5 0 - 5 WBC/hpf   Bacteria, UA RARE (A) NONE SEEN   Squamous  Epithelial / LPF 0-5 0 - 5    Comment: Performed at [facility], [place] [address] [place], [place] [address]  Rapid urine drug screen (hospital performed)     Status: Abnormal   Collection Time: 10/21/21  4:31 PM  Result Value Ref Range   Opiates POSITIVE (A) NONE DETECTED   Cocaine POSITIVE (A) NONE DETECTED   Benzodiazepines POSITIVE (A) NONE DETECTED   Amphetamines NONE DETECTED NONE DETECTED   Tetrahydrocannabinol NONE DETECTED NONE DETECTED   Barbiturates NONE DETECTED NONE DETECTED    Comment: (NOTE) DRUG SCREEN FOR MEDICAL PURPOSES ONLY.  IF CONFIRMATION IS NEEDED FOR ANY PURPOSE, NOTIFY LAB WITHIN 5 DAYS.  LOWEST DETECTABLE LIMITS FOR URINE DRUG SCREEN Drug Class                     Cutoff (ng/mL) Amphetamine and metabolites    1000 Barbiturate and metabolites    200 Benzodiazepine                 [id number] Tricyclics and metabolites     300 Opiates and metabolites        300 Cocaine and metabolites        300 THC                            50 Performed at [facility], [place] [address] [place], [place] [address]   I-Stat beta hCG blood, ED     Status: None   Collection Time: 10/21/21  4:57 PM  Result Value Ref Range   I-stat hCG, quantitative <5.0 <5 mIU/mL   Comment 3            Comment:   GEST. AGE      CONC.  (mIU/mL)   <=1 WEEK        5 - 50     2 WEEKS       50 - 500     3 WEEKS       100 - 10,000     4 WEEKS     1,000 - 30,000        FEMALE AND NON-PREGNANT FEMALE:     LESS THAN 5 mIU/mL   I-stat chem 8, ED (not at [facility] or [facility])     Status: Abnormal   Collection Time: 10/21/21  4:58 PM  Result Value Ref Range   Sodium 135 135 - 145 mmol/L   Potassium 5.4 (H) 3.5 - 5.1 mmol/L   Chloride 108 98 - 111 mmol/L   BUN 17 6 - 20 mg/dL   Creatinine, Ser 2.10 (H) 0.44 - 1.00 mg/dL   Glucose, Bld 99 70 - 99 mg/dL     Comment: Glucose reference range applies only to samples taken after fasting for at least 8 hours.   Calcium, Ion 0.92 (L) 1.15 - 1.40 mmol/L   TCO2 26 22 - 32 mmol/L   Hemoglobin 10.5 (L) 12.0 - 15.0 g/dL   HCT 31.0 (L) 36.0 - 46.0 %  TSH     Status: Abnormal   Collection Time: 10/21/21  6:00 PM  Result Value Ref Range   TSH 5.958 (H) 0.350 - 4.500 uIU/mL    Comment: Performed by a 3rd Generation assay with a functional sensitivity of <=0.01 uIU/mL. Performed at [facility], [place] [address], [place], [place] [address]   Troponin I (High Sensitivity)     Status: Abnormal  Collection Time: 10/21/21  6:14 PM  Result Value Ref Range   Troponin I (High Sensitivity) 27 (H) <18 ng/L    Comment: (NOTE) Elevated high sensitivity troponin I (hsTnI) values and significant  changes across serial measurements may suggest ACS but many other  chronic and acute conditions are known to elevate hsTnI results.  Refer to the "Links" section for chest pain algorithms and additional  guidance. Performed at [facility], [place] [address], [place], [place] [address]    DG Chest Port 1 View  Result Date: 10/21/2021 CLINICAL DATA:  concerns of dissection EXAM: PORTABLE CHEST 1 VIEW COMPARISON:  Radiograph 10/15/2021 FINDINGS: Unchanged cardiomediastinal silhouette with widened mediastinum due to known aortic dissection. No new airspace disease. No pleural effusion. No pneumothorax. No acute osseous abnormality. IMPRESSION: Unchanged widened mediastinum due to known aortic dissection. No new airspace disease. Electronically Signed   By: Maurine Simmering M.D.   On: 10/21/2021 16:45    Pending Labs Unresulted Labs (From admission, onward)     Start     Ordered   10/22/21 0500  CBC with Differential/Platelet  Tomorrow morning,   R        10/21/21 2040   10/22/21 0500  Comprehensive metabolic panel  Tomorrow morning,   R        10/21/21 2040   10/22/21 0500  Magnesium  Tomorrow morning,   R         10/21/21 2040   10/22/21 0500  Protime-INR  Tomorrow morning,   R        10/21/21 2041   10/22/21 0500  Blood gas, venous  Tomorrow morning,   R        10/21/21 2055   10/21/21 2045  T4, free  Add-on,   AD        10/21/21 2044   10/21/21 2043  Ferritin  Add-on,   AD        10/21/21 2042   10/21/21 2043  Iron and TIBC  Add-on,   AD        10/21/21 2042   10/21/21 2040  Sodium, urine, random  Add-on,   AD        10/21/21 2040   10/21/21 2040  Creatinine, urine, random  Add-on,   AD        10/21/21 2040   10/21/21 2040  CK  Add-on,   AD        10/21/21 2040            Vitals/Pain Today's Vitals   10/21/21 1945 10/21/21 2030 10/21/21 2100 10/21/21 2130  BP: 91/61 94/66 95/68  94/71  Pulse: 68 68 64 65  Resp: 14 15 14 12   Temp:      TempSrc:      SpO2: 98% 98% 100% 98%  Weight:      Height:      PainSc:        Isolation Precautions No active isolations  Medications Medications  lactated ringers infusion (has no administration in time range)  acetaminophen (TYLENOL) tablet 650 mg (has no administration in time range)    Or  acetaminophen (TYLENOL) suppository 650 mg (has no administration in time range)  naloxone (NARCAN) injection 0.4 mg (has no administration in time range)  ferrous sulfate tablet 325 mg (has no administration in time range)  hydrOXYzine (ATARAX) tablet 25 mg (has no administration in time range)  nicotine (NICODERM CQ - dosed in mg/24 hours) patch 14 mg (has no administration  in time range)  thiamine tablet 100 mg (has no administration in time range)  sodium chloride 0.9 % bolus 1,000 mL (0 mLs Intravenous Stopped 10/21/21 1726)  naloxone (NARCAN) injection 0.4 mg (0.4 mg Intravenous Given 10/21/21 1625)    Mobility walks with person assist Low fall risk    R Recommendations: See Admitting Provider Note  Report given to: Milinda Pointer RN  Additional Notes:

## 2021-10-21 NOTE — Assessment & Plan Note (Signed)
#(  Elevated troponin:: Mildly elevated troponin 27, with repeat value unchanged, relative to most recent prior value of 9 on 10/15/2021.  Suspect type II supply/demand mismatch as a consequence of transient hypoperfusion as result of initial hypotension complicated by decline in oxygen delivery capacity as a result of acute on chronic anemia, with additional contribution from diminished renal clearance in the setting of AKI on CKD 3A.  ACS, thought to be less likely, including presenting EKG, shows normal sinus rhythm without any evidence of acute ischemic changes, including no evidence of ST changes.  Of note, chest x-ray also unchanged from most recent prior, including no evidence of acute interval process.  Patient's recent diagnosis of thoracic aortic dissection, undergoing medical management, associated noted, with vascular surgery consulted and following during this hospitalization, as further noted above.  Of note, echocardiogram was obtained during this most recent prior hospitalization, with this imaging study occurring on 10/16/2021.  Plan: Continue to trend troponin.  Monitor on telemetry..  Repeat serum magnesium level in the morning.  Further evaluation management borderline hypotension as well as AKI on CKD 3A, as above.

## 2021-10-22 LAB — COMPREHENSIVE METABOLIC PANEL
ALT: 25 U/L (ref 0–44)
AST: 22 U/L (ref 15–41)
Albumin: 2.9 g/dL — ABNORMAL LOW (ref 3.5–5.0)
Alkaline Phosphatase: 46 U/L (ref 38–126)
Anion gap: 8 (ref 5–15)
BUN: 14 mg/dL (ref 6–20)
CO2: 27 mmol/L (ref 22–32)
Calcium: 8.5 mg/dL — ABNORMAL LOW (ref 8.9–10.3)
Chloride: 103 mmol/L (ref 98–111)
Creatinine, Ser: 1.39 mg/dL — ABNORMAL HIGH (ref 0.44–1.00)
GFR, Estimated: 45 mL/min — ABNORMAL LOW (ref >60)
Glucose, Bld: 113 mg/dL — ABNORMAL HIGH (ref 70–99)
Potassium: 4 mmol/L (ref 3.5–5.1)
Sodium: 138 mmol/L (ref 135–145)
Total Bilirubin: 0.6 mg/dL (ref 0.3–1.2)
Total Protein: 5 g/dL — ABNORMAL LOW (ref 6.5–8.1)

## 2021-10-22 LAB — BLOOD GAS, VENOUS
Acid-Base Excess: 2.8 mmol/L — ABNORMAL HIGH (ref 0.0–2.0)
Bicarbonate: 29.4 mmol/L — ABNORMAL HIGH (ref 20.0–28.0)
O2 Saturation: 84.8 %
Patient temperature: 36.6
pCO2, Ven: 51 mmHg (ref 44–60)
pH, Ven: 7.37 (ref 7.25–7.43)
pO2, Ven: 51 mmHg — ABNORMAL HIGH (ref 32–45)

## 2021-10-22 LAB — CBC WITH DIFFERENTIAL/PLATELET
Abs Immature Granulocytes: 0.01 10*3/uL (ref 0.00–0.07)
Basophils Absolute: 0 10*3/uL (ref 0.0–0.1)
Basophils Relative: 0 %
Eosinophils Absolute: 0 10*3/uL (ref 0.0–0.5)
Eosinophils Relative: 0 %
HCT: 30.1 % — ABNORMAL LOW (ref 36.0–46.0)
Hemoglobin: 9.5 g/dL — ABNORMAL LOW (ref 12.0–15.0)
Immature Granulocytes: 0 %
Lymphocytes Relative: 34 %
Lymphs Abs: 1.7 10*3/uL (ref 0.7–4.0)
MCH: 28.3 pg (ref 26.0–34.0)
MCHC: 31.6 g/dL (ref 30.0–36.0)
MCV: 89.6 fL (ref 80.0–100.0)
Monocytes Absolute: 0.4 10*3/uL (ref 0.1–1.0)
Monocytes Relative: 9 %
Neutro Abs: 2.7 10*3/uL (ref 1.7–7.7)
Neutrophils Relative %: 57 %
Platelets: 199 10*3/uL (ref 150–400)
RBC: 3.36 MIL/uL — ABNORMAL LOW (ref 3.87–5.11)
RDW: 14 % (ref 11.5–15.5)
WBC: 4.8 10*3/uL (ref 4.0–10.5)
nRBC: 0 % (ref 0.0–0.2)

## 2021-10-22 LAB — PROTIME-INR
INR: 1 (ref 0.8–1.2)
Prothrombin Time: 13.2 seconds (ref 11.4–15.2)

## 2021-10-22 LAB — MAGNESIUM: Magnesium: 2.1 mg/dL (ref 1.7–2.4)

## 2021-10-22 LAB — FERRITIN: Ferritin: 174 ng/mL (ref 11–307)

## 2021-10-22 LAB — IRON AND TIBC
Iron: 101 ug/dL (ref 28–170)
Saturation Ratios: 31 % (ref 10.4–31.8)
TIBC: 323 ug/dL (ref 250–450)
UIBC: 222 ug/dL

## 2021-10-22 LAB — CREATININE, URINE, RANDOM: Creatinine, Urine: 40.05 mg/dL

## 2021-10-22 LAB — TROPONIN I (HIGH SENSITIVITY): Troponin I (High Sensitivity): 18 ng/L — ABNORMAL HIGH (ref <18)

## 2021-10-22 LAB — CK: Total CK: 64 U/L (ref 38–234)

## 2021-10-22 LAB — T4, FREE: Free T4: 0.72 ng/dL (ref 0.61–1.12)

## 2021-10-22 LAB — SODIUM, URINE, RANDOM: Sodium, Ur: 42 mmol/L

## 2021-10-22 NOTE — Discharge Summary (Signed)
Physician Discharge Summary  Sherleen Panciera [id number] DOB: 1965-11-04 DOA: 10/21/2021  PCP: System, Provider Not In  Admit date: 10/21/2021 Discharge date: 10/22/2021  Admitted From: Home Disposition: Home (reportedly transporting to rehab)  Recommendations for Outpatient Follow-up:  Follow up with PCP in 1-2 weeks  Discharge Condition: Stable CODE STATUS: Full Diet recommendation: As tolerated  Brief/Interim Summary: [name] is a 56 y.o. female with medical history significant for thoracic aortic dissection, TB, essential hypertension, generalized anxiety sorter, chronic anemia associated baseline hemoglobin 11-13, polysubstance abuse, chronic tobacco abuse, who is admitted to [facility] on 10/21/2021 with acute kidney injury superimposed on suspected CKD stage IIIa after presenting from home to [facility] ED for evaluation of altered mental status.    Of note the patient had just left the hospital yesterday after prolonged hospital stay over evaluation for thoracic aortic dissection, type B managed medically, vascular following along at that time.  Blood pressure well controlled at this time.  Unfortunately in route to rehab patient gained access to heroin and snorted this while in transport with subsequent altered mental status prompting return to the ED for further evaluation and treatment.  Admitted overnight under observation in the setting of overdose, patient received single dose of IV Narcan and did not require any additional treatment.  At this time she is back to her baseline and is otherwise stable for discharge.  Remainder of labs and work-up remain unremarkable.  Discharge Diagnoses:  Principal Problem:   AKI (acute kidney injury) ([place]) Active Problems:   Dissecting aneurysm of thoracic aorta, Stanford type B (HCC)   Acute encephalopathy   Hypotension   Heroin overdose, accidental or unintentional, initial encounter ([place])   Elevated troponin   Acute on chronic  anemia   GAD (generalized anxiety disorder)   Tobacco abuse    Discharge Instructions  Discharge Instructions     Discharge patient   Complete by: As directed    Discharge disposition: 01-Home or Self Care   Discharge patient date: 10/22/2021      Allergies as of 10/22/2021       Reactions   Ibuprofen Other (See Comments)   Blood in stool   Benadryl [diphenhydramine] Anxiety   Sulfa Antibiotics Itching        Medication List     STOP taking these medications    traZODone 50 MG tablet Commonly known as: DESYREL       TAKE these medications    acetaminophen 650 MG CR tablet Commonly known as: TYLENOL Take 650 mg by mouth every 6 (six) hours as needed for pain.   Aleve PM 220-25 MG Tabs Generic drug: Naproxen Sod-diphenhydrAMINE Take 2 tablets by mouth at bedtime as needed (For sleep or pain).   alum & mag hydroxide-simeth 200-200-20 MG/5ML suspension Commonly known as: MAALOX/MYLANTA Take 30 mLs by mouth every 4 (four) hours as needed for indigestion.   aspirin 81 MG chewable tablet Chew 81 mg by mouth daily.   cloNIDine 0.2 MG tablet Commonly known as: CATAPRES Take 1 tablet (0.2 mg total) by mouth 3 (three) times daily.   dicyclomine 20 MG tablet Commonly known as: BENTYL Take 20 mg by mouth every 6 (six) hours as needed for spasms.   hydrOXYzine 25 MG tablet Commonly known as: ATARAX Take 25 mg by mouth every 6 (six) hours as needed for anxiety.   IRON PO Take 1 tablet by mouth daily.   labetalol 300 MG tablet Commonly known as: NORMODYNE Take 1 tablet (300 mg  total) by mouth 3 (three) times daily.   loperamide 2 MG capsule Commonly known as: IMODIUM Take 2-4 mg by mouth as needed for diarrhea or loose stools.   methocarbamol 500 MG tablet Commonly known as: ROBAXIN Take 500 mg by mouth every 8 (eight) hours as needed for muscle spasms.   MILK OF MAGNESIA PO Take 30 mLs by mouth daily as needed (constipation).   multivitamin with  minerals Tabs tablet Take 1 tablet by mouth daily.   nicotine 14 mg/24hr patch Commonly known as: NICODERM CQ - dosed in mg/24 hours Place 1 patch (14 mg total) onto the skin daily.   ondansetron 4 MG disintegrating tablet Commonly known as: ZOFRAN-ODT Take 4 mg by mouth every 6 (six) hours as needed for nausea or vomiting.   PROBIOTIC PO Take 1 tablet by mouth daily.   thiamine 100 MG tablet Take 1 tablet (100 mg total) by mouth daily.   VITAMIN B-12 PO Take 1 tablet by mouth daily.        Allergies  Allergen Reactions   Ibuprofen Other (See Comments)    Blood in stool   Benadryl [Diphenhydramine] Anxiety   Sulfa Antibiotics Itching    Consultations: Vascular surgery  Procedures/Studies: DG Chest Port 1 View  Result Date: 10/21/2021 CLINICAL DATA:  concerns of dissection EXAM: PORTABLE CHEST 1 VIEW COMPARISON:  Radiograph 10/15/2021 FINDINGS: Unchanged cardiomediastinal silhouette with widened mediastinum due to known aortic dissection. No new airspace disease. No pleural effusion. No pneumothorax. No acute osseous abnormality. IMPRESSION: Unchanged widened mediastinum due to known aortic dissection. No new airspace disease. Electronically Signed   By: Maurine Simmering M.D.   On: 10/21/2021 16:45   CT Angio Chest/Abd/Pel for Dissection W and/or W/WO  Result Date: 10/17/2021 CLINICAL DATA:  Follow-up dissection. EXAM: CT ANGIOGRAPHY CHEST, ABDOMEN AND PELVIS TECHNIQUE: Non-contrast CT of the chest was initially obtained. Multidetector CT imaging through the chest, abdomen and pelvis was performed using the standard protocol during bolus administration of intravenous contrast. Multiplanar reconstructed images and MIPs were obtained and reviewed to evaluate the vascular anatomy. RADIATION DOSE REDUCTION: This exam was performed according to the departmental dose-optimization program which includes automated exposure control, adjustment of the mA and/or kV according to patient size  and/or use of iterative reconstruction technique. CONTRAST:  136mL OMNIPAQUE IOHEXOL 350 MG/ML SOLN COMPARISON:  CT angiogram chest 10/15/2021. FINDINGS: CTA CHEST FINDINGS Cardiovascular: Again seen is thoracic aortic dissection. The origin of the dissection flap occurs just distal to the takeoff of the left subclavian artery and appears similar in extent when compared to the prior study. The proximal descending aorta is dilated measuring up to 4.6 cm, unchanged. Mid descending aorta measures up to 3.9 cm and aorta at the level of the hiatus measures 3.9 cm. The true lumen throughout the descending thoracic aorta is patent, but narrowed (approximally 15-20%). There is a small amount of contrast within the false lumen. Origin of the great vessels demonstrates bovine arch anatomy. Origin of great vessels appear normal. No central pulmonary embolism. Heart is borderline enlarged. No pericardial effusion. Mediastinum/Nodes: No enlarged mediastinal, hilar, or axillary lymph nodes. Thyroid gland, trachea, and esophagus demonstrate no significant findings. Lungs/Pleura: There is minimal atelectasis in the lower lobes. The lungs are otherwise clear. No pleural effusion or pneumothorax. Musculoskeletal: No chest wall abnormality. No acute or significant osseous findings. Review of the MIP images confirms the above findings. CTA ABDOMEN AND PELVIS FINDINGS VASCULAR Aorta: Borders of the aorta are difficult to visualize secondary  to technique and body habitus. Abdominal infrarenal abdominal aorta dilated measuring 3.0 x 3.5 cm. Proximal abdominal aorta dilated measuring 3.2 cm. Aortic dissection is seen throughout the abdominal aorta to the level of the bifurcation. The true lumen is patent, but small in diameter (proximally 15-20%). Celiac: From true lumen.  Patent and within normal limits. SMA: From true lumen.  Patent and within normal limits. Renals: Right renal artery and accessory right renal artery are small in  caliber, but grossly patent arising from the true lumen. Left renal artery arises from false lumen and is nonopacified. IMA: Not opacified/visualized. Inflow: Right-sided within normal limits. Trace flow tapering to absent flow in the left common iliac artery. No flow identified in the left external or internal iliac arteries. Veins: No obvious venous abnormality within the limitations of this arterial phase study. Review of the MIP images confirms the above findings. NON-VASCULAR Hepatobiliary: No focal liver abnormality is seen. Status post cholecystectomy. No biliary dilatation. Pancreas: Unremarkable. No pancreatic ductal dilatation or surrounding inflammatory changes. Spleen: Normal in size without focal abnormality. Adrenals/Urinary Tract: Adrenal glands are not well visualized. Kidneys are not well delineated, no definite hydronephrosis. There is likely decreased enhancement throughout the left kidney. Bladder is within normal limits. Stomach/Bowel: Stomach is within normal limits. Appendix appears normal. No evidence of bowel wall thickening, distention, or inflammatory changes. There is diffuse gaseous distention of the colon. Appendix not visualized. Lymphatic: No enlarged lymph nodes are seen. Reproductive: Uterus within normal limits. Adnexa unremarkable. There is a surgical clip in the right adnexa. Other: No ascites or focal abdominal wall hernia. Musculoskeletal: No acute or significant osseous findings. Review of the MIP images confirms the above findings. IMPRESSION: 1. Stanford type B/DeBakey type 3 B aortic dissection extends to the level of the iliac bifurcation. Stable aneurysmal dilatation of the proximal descending thoracic aorta measuring 4.6 cm. Aneurysmal dilatation of the abdominal aorta measuring up to 3.5 cm. The true lumen is patent, but small in size (approximally 15-20% of overall diameter). 2. Left renal artery and inferior mesenteric arteries are not opacified arising from the false  lumen. 3. Severe attenuated flow in the left common iliac artery with absent flow in the left external iliac, internal iliac, and common femoral arteries. These results were called by telephone at the time of interpretation on 10/17/2021 at 5:11 pm to provider Cheri Fowler MD, who verbally acknowledged these results. Electronically Signed   By: Darliss Cheney M.D.   On: 10/17/2021 17:11   ECHOCARDIOGRAM COMPLETE  Result Date: 10/16/2021    ECHOCARDIOGRAM REPORT   Patient Name:   STACEE EARP Date of Exam: 10/16/2021 Medical Rec #:  [id number]     Height:       63.0 in Accession #:    [id number]    Weight:       103.6 lb Date of Birth:  [birth year]     BSA:          1.462 m Patient Age:    55 years      BP:           90/52 mmHg Patient Gender: F             HR:           73 bpm. Exam Location:  Inpatient Procedure: 2D Echo, Cardiac Doppler and Color Doppler Indications:    Ascending aortic anneurysm  History:        Patient has no prior history of Echocardiogram examinations.  Sonographer:  Jefferey Pica Referring Phys: [id number] [place]  1. Left ventricular ejection fraction, by estimation, is >75%. The left ventricle has hyperdynamic function. The left ventricle has no regional wall motion abnormalities. There is severe concentric left ventricular hypertrophy. Left ventricular diastolic parameters are indeterminate.  2. Right ventricular systolic function is normal. The right ventricular size is normal. There is normal pulmonary artery systolic pressure.  3. Left atrial size was mildly dilated.  4. The mitral valve is normal in structure. Trivial mitral valve regurgitation.  5. The aortic valve is normal in structure. Aortic valve regurgitation is not visualized.  6. The aortic dissection is not visualized on this echo. FINDINGS  Left Ventricle: Left ventricular ejection fraction, by estimation, is >75%. The left ventricle has hyperdynamic function. The left ventricle has no regional wall motion  abnormalities. The left ventricular internal cavity size was normal in size. There is severe concentric left ventricular hypertrophy. Left ventricular diastolic parameters are indeterminate. Right Ventricle: The right ventricular size is normal. Right vetricular wall thickness was not well visualized. Right ventricular systolic function is normal. There is normal pulmonary artery systolic pressure. The tricuspid regurgitant velocity is 1.25 m/s, and with an assumed right atrial pressure of 5 mmHg, the estimated right ventricular systolic pressure is 123XX123 mmHg. Left Atrium: Left atrial size was mildly dilated. Right Atrium: Right atrial size was normal in size. Pericardium: There is no evidence of pericardial effusion. Mitral Valve: The mitral valve is normal in structure. Trivial mitral valve regurgitation. Tricuspid Valve: The tricuspid valve is normal in structure. Tricuspid valve regurgitation is trivial. Aortic Valve: The aortic valve is normal in structure. Aortic valve regurgitation is not visualized. Aortic valve peak gradient measures 10.3 mmHg. Pulmonic Valve: The pulmonic valve was normal in structure. Pulmonic valve regurgitation is not visualized. Aorta: The aortic dissection is not visualized on this echo. The aortic root and ascending aorta are structurally normal, with no evidence of dilitation. IAS/Shunts: The atrial septum is grossly normal.  LEFT VENTRICLE PLAX 2D LVIDd:         2.90 cm   Diastology LVIDs:         1.10 cm   LV e' lateral:   16.20 cm/s LV PW:         1.60 cm   LV E/e' lateral: 3.9 LV IVS:        2.00 cm LVOT diam:     1.90 cm LV SV:         73 LV SV Index:   50 LVOT Area:     2.84 cm  RIGHT VENTRICLE          IVC RV Basal diam:  2.50 cm  IVC diam: 0.80 cm TAPSE (M-mode): 2.1 cm LEFT ATRIUM             Index        RIGHT ATRIUM           Index LA diam:        3.10 cm 2.12 cm/m   RA Area:     10.40 cm LA Vol (A2C):   49.4 ml 33.79 ml/m  RA Volume:   19.20 ml  13.13 ml/m LA Vol  (A4C):   51.3 ml 35.09 ml/m LA Biplane Vol: 52.7 ml 36.04 ml/m  AORTIC VALVE                 PULMONIC VALVE AV Area (Vmax): 2.70 cm     PV Vmax:  0.78 m/s AV Vmax:        160.50 cm/s  PV Peak grad:  2.4 mmHg AV Peak Grad:   10.3 mmHg LVOT Vmax:      153.00 cm/s LVOT Vmean:     95.900 cm/s LVOT VTI:       0.258 m  AORTA Ao Root diam: 3.20 cm Ao Asc diam:  3.30 cm MITRAL VALVE                TRICUSPID VALVE MV Area (PHT): 3.33 cm     TR Peak grad:   6.2 mmHg MV Decel Time: 228 msec     TR Vmax:        125.00 cm/s MV E velocity: 62.60 cm/s MV A velocity: 112.00 cm/s  SHUNTS MV E/A ratio:  0.56         Systemic VTI:  0.26 m                             Systemic Diam: 1.90 cm Kristeen Miss MD Electronically signed by Kristeen Miss MD Signature Date/Time: 10/16/2021/3:29:44 PM    Final    CT Angio Chest PE W/Cm &/Or Wo Cm  Result Date: 10/15/2021 CLINICAL DATA:  Chest pain with nausea and vomiting. EXAM: CT ANGIOGRAPHY CHEST WITH CONTRAST TECHNIQUE: Multidetector CT imaging of the chest was performed using the standard protocol during bolus administration of intravenous contrast. Multiplanar CT image reconstructions and MIPs were obtained to evaluate the vascular anatomy. RADIATION DOSE REDUCTION: This exam was performed according to the departmental dose-optimization program which includes automated exposure control, adjustment of the mA and/or kV according to patient size and/or use of iterative reconstruction technique. CONTRAST:  54mL OMNIPAQUE IOHEXOL 350 MG/ML SOLN COMPARISON:  None Available. FINDINGS: Cardiovascular: There is dissection of a dilated distal aortic arch and descending thoracic aorta (approximately 4.7 cm in diameter. This dissection extends from the descending portion of the aortic arch into the visualized portion of the abdominal aorta. The true lumen of the thoracic aorta measures approximately 20% of the total aortic lumen. Satisfactory opacification of the pulmonary arteries to the  segmental level. No evidence of pulmonary embolism. Normal heart size. No pericardial effusion. Mediastinum/Nodes: No enlarged mediastinal, hilar, or axillary lymph nodes. Thyroid gland, trachea, and esophagus demonstrate no significant findings. Lungs/Pleura: Lungs are clear. No pleural effusion or pneumothorax. Upper Abdomen: No acute abnormality. Musculoskeletal: Multilevel degenerative changes seen throughout the thoracic spine. Review of the MIP images confirms the above findings. 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 Further evaluation with CTA of the abdomen and pelvis is recommended to determine the extent of the dissection within the abdomen. Electronically Signed   By: Aram Candela M.D.   On: 10/15/2021 17:50   DG Chest 2 View  Result Date: 10/15/2021 CLINICAL DATA:  Chest pain, weakness EXAM: CHEST - 2 VIEW COMPARISON:  08/30/2018 FINDINGS: The heart size and mediastinal contours are within normal limits. Both lungs are clear. Disc degenerative disease of the thoracic spine. IMPRESSION: No acute abnormality of the lungs. Electronically Signed   By: Jearld Lesch M.D.   On: 10/15/2021 14:02     Subjective: No acute issues or events this morning, back to baseline requesting discharge which is certainly reasonable   Discharge Exam: Vitals:   10/22/21 0309 10/22/21 0723  BP: 101/77 107/68  Pulse: 80  77  Resp: 16 16  Temp: 98 F (36.7 C) 98.2 F (36.8 C)  SpO2: 100% 100%   Vitals:   10/21/21 2257 10/22/21 0309 10/22/21 0444 10/22/21 0723  BP: 105/77 101/77  107/68  Pulse: 81 80  77  Resp: 16 16  16   Temp: 98.4 F (36.9 C) 98 F (36.7 C)  98.2 F (36.8 C)  TempSrc: Oral Axillary  Oral  SpO2: 100% 100%  100%  Weight: 51.4 kg  51.6 kg   Height:        General: Pt is alert, awake, not in acute distress Cardiovascular: RRR, S1/S2 +, no  rubs, no gallops Respiratory: CTA bilaterally, no wheezing, no rhonchi Abdominal: Soft, NT, ND, bowel sounds + Extremities: no edema, no cyanosis    The results of significant diagnostics from this hospitalization (including imaging, microbiology, ancillary and laboratory) are listed below for reference.     Microbiology: Recent Results (from the past 240 hour(s))  Resp Panel by RT-PCR (Flu A&B, Covid) Anterior Nasal Swab     Status: None   Collection Time: 10/12/21 12:45 PM   Specimen: Anterior Nasal Swab  Result Value Ref Range Status   SARS Coronavirus 2 by RT PCR NEGATIVE NEGATIVE Final    Comment: (NOTE) SARS-CoV-2 target nucleic acids are NOT DETECTED.  The SARS-CoV-2 RNA is generally detectable in upper respiratory specimens during the acute phase of infection. The lowest concentration of SARS-CoV-2 viral copies this assay can detect is 138 copies/mL. A negative result does not preclude SARS-Cov-2 infection and should not be used as the sole basis for treatment or other patient management decisions. A negative result may occur with  improper specimen collection/handling, submission of specimen other than nasopharyngeal swab, presence of viral mutation(s) within the areas targeted by this assay, and inadequate number of viral copies(<138 copies/mL). A negative result must be combined with clinical observations, patient history, and epidemiological information. The expected result is Negative.  Fact Sheet for Patients:  10/14/21  Fact Sheet for Healthcare Providers:  BloggerCourse.com  This test is no t yet approved or cleared by the [employer].[place] FDA and  has been authorized for detection and/or diagnosis of SARS-CoV-2 by FDA under an Emergency Use Authorization (EUA). This EUA will remain  in effect (meaning this test can be used) for the duration of the COVID-19 declaration under Section 564(b)(1) of the Act,  21 U.S.C.section 360bbb-3(b)(1), unless the authorization is terminated  or revoked sooner.       Influenza A by PCR NEGATIVE NEGATIVE Final   Influenza B by PCR NEGATIVE NEGATIVE Final    Comment: (NOTE) The Xpert Xpress SARS-CoV-2/FLU/RSV plus assay is intended as an aid in the diagnosis of influenza from Nasopharyngeal swab specimens and should not be used as a sole basis for treatment. Nasal washings and aspirates are unacceptable for Xpert Xpress SARS-CoV-2/FLU/RSV testing.  Fact Sheet for Patients: [place]  Fact Sheet for Healthcare Providers: BloggerCourse.com  This test is not yet approved or cleared by the [employer]ious[employer] FDA and has been authorized for detection and/or diagnosis of SARS-CoV-2 by FDA under an Emergency Use Authorization (EUA). This EUA will remain in effect (meaning this test can be used) for the duration of the COVID-19 declaration under Section 564(b)(1) of the Act, 21 U.S.C. section 360bbb-3(b)(1), unless the authorization is terminated or revoked.  Performed at [facility], [address], [place], [place]   MRSA Next Gen by PCR, Nasal     Status: None  Collection Time: 10/15/21  8:23 PM   Specimen: Nasal Mucosa; Nasal Swab  Result Value Ref Range Status   MRSA by PCR Next Gen NOT DETECTED NOT DETECTED Final    Comment: (NOTE) The GeneXpert MRSA Assay (FDA approved for NASAL specimens only), is one component of a comprehensive MRSA colonization surveillance program. It is not intended to diagnose MRSA infection nor to guide or monitor treatment for MRSA infections. Test performance is not FDA approved in patients less than 30 years old. Performed at [facility], [place] [address], [place], [place] [address]      Labs: BNP (last 3 results) No results for input(s): "BNP" in the last 8760 hours. Basic Metabolic Panel: Recent Labs  Lab 10/16/21 0500  10/17/21 0319 10/18/21 0935 10/19/21 0745 10/21/21 1618 10/21/21 1658 10/22/21 0424  NA 138 141 142 141 139 135 138  K 3.4* 3.7 3.6 3.5 4.3 5.4* 4.0  CL 104 112* 111 109 106 108 103  CO2 25 23 26 26 27   --  27  GLUCOSE 130* 100* 80 90 110* 99 113*  BUN 18 13 9 9 13 17 14   CREATININE 1.23* 1.22* 1.42* 1.47* 1.98* 2.10* 1.39*  CALCIUM 8.7* 8.5* 8.5* 8.5* 9.1  --  8.5*  MG 2.2  --  2.0  --  2.4  --  2.1  PHOS 3.2  --  2.5  --   --   --   --    Liver Function Tests: Recent Labs  Lab 10/15/21 1332 10/21/21 1618 10/22/21 0424  AST 22 32 22  ALT 20 31 25   ALKPHOS 52 49 46  BILITOT 0.9 0.2* 0.6  PROT 6.4* 5.9* 5.0*  ALBUMIN 3.9 3.6 2.9*   No results for input(s): "LIPASE", "AMYLASE" in the last 168 hours. No results for input(s): "AMMONIA" in the last 168 hours. CBC: Recent Labs  Lab 10/15/21 1332 10/16/21 0500 10/17/21 0319 10/21/21 1618 10/21/21 1658 10/22/21 0424  WBC 7.2 7.6 5.0 5.1  --  4.8  NEUTROABS 5.1  --  2.8  --   --  2.7  HGB 14.3 13.6 11.6* 10.3* 10.5* 9.5*  HCT 44.0 40.1 34.2* 33.7* 31.0* 30.1*  MCV 86.3 84.4 84.7 91.1  --  89.6  PLT 303 263 199 248  --  199   Cardiac Enzymes: Recent Labs  Lab 10/21/21 1814  CKTOTAL 64   BNP: Invalid input(s): "POCBNP" CBG: Recent Labs  Lab 10/15/21 2021 10/21/21 1620  GLUCAP 104* 112*   D-Dimer No results for input(s): "DDIMER" in the last 72 hours. Hgb A1c No results for input(s): "HGBA1C" in the last 72 hours. Lipid Profile No results for input(s): "CHOL", "HDL", "LDLCALC", "TRIG", "CHOLHDL", "LDLDIRECT" in the last 72 hours. Thyroid function studies Recent Labs    10/21/21 1800  TSH 5.958*   Anemia work up Recent Labs    10/21/21 1814  FERRITIN 174  TIBC 323  IRON 101   Urinalysis    Component Value Date/Time   COLORURINE YELLOW 10/21/2021 [place] 10/21/2021 1629   LABSPEC 1.004 (L) 10/21/2021 1629   PHURINE 7.0 10/21/2021 1629   GLUCOSEU NEGATIVE 10/21/2021 1629    HGBUR NEGATIVE 10/21/2021 [place] NEGATIVE 10/21/2021 1629   KETONESUR NEGATIVE 10/21/2021 1629   PROTEINUR NEGATIVE 10/21/2021 1629   UROBILINOGEN 0.2 12/31/2014 1649   NITRITE NEGATIVE 10/21/2021 1629   LEUKOCYTESUR SMALL (A) 10/21/2021 1629   Sepsis Labs Recent Labs  Lab 10/16/21 0500 10/17/21 0319  10/21/21 1618 10/22/21 0424  WBC 7.6 5.0 5.1 4.8   Microbiology Recent Results (from the past 240 hour(s))  Resp Panel by RT-PCR (Flu A&B, Covid) Anterior Nasal Swab     Status: None   Collection Time: 10/12/21 12:45 PM   Specimen: Anterior Nasal Swab  Result Value Ref Range Status   SARS Coronavirus 2 by RT PCR NEGATIVE NEGATIVE Final    Comment: (NOTE) SARS-CoV-2 target nucleic acids are NOT DETECTED.  The SARS-CoV-2 RNA is generally detectable in upper respiratory specimens during the acute phase of infection. The lowest concentration of SARS-CoV-2 viral copies this assay can detect is 138 copies/mL. A negative result does not preclude SARS-Cov-2 infection and should not be used as the sole basis for treatment or other patient management decisions. A negative result may occur with  improper specimen collection/handling, submission of specimen other than nasopharyngeal swab, presence of viral mutation(s) within the areas targeted by this assay, and inadequate number of viral copies(<138 copies/mL). A negative result must be combined with clinical observations, patient history, and epidemiological information. The expected result is Negative.  Fact Sheet for Patients:  EntrepreneurPulse.com.au  Fact Sheet for Healthcare Providers:  IncredibleEmployment.be  This test is no t yet approved or cleared by the [place] FDA and  has been authorized for detection and/or diagnosis of SARS-CoV-2 by FDA under an Emergency Use Authorization (EUA). This EUA will remain  in effect (meaning this test can be used) for the duration of  the COVID-19 declaration under Section 564(b)(1) of the Act, 21 U.S.C.section 360bbb-3(b)(1), unless the authorization is terminated  or revoked sooner.       Influenza A by PCR NEGATIVE NEGATIVE Final   Influenza B by PCR NEGATIVE NEGATIVE Final    Comment: (NOTE) The Xpert Xpress SARS-CoV-2/FLU/RSV plus assay is intended as an aid in the diagnosis of influenza from Nasopharyngeal swab specimens and should not be used as a sole basis for treatment. Nasal washings and aspirates are unacceptable for Xpert Xpress SARS-CoV-2/FLU/RSV testing.  Fact Sheet for Patients: EntrepreneurPulse.com.au  Fact Sheet for Healthcare Providers: IncredibleEmployment.be  This test is not yet approved or cleared by the [place] FDA and has been authorized for detection and/or diagnosis of SARS-CoV-2 by FDA under an Emergency Use Authorization (EUA). This EUA will remain in effect (meaning this test can be used) for the duration of the COVID-19 declaration under Section 564(b)(1) of the Act, 21 U.S.C. section 360bbb-3(b)(1), unless the authorization is terminated or revoked.  Performed at [facility], [place] [address] [place], [place] [address]   MRSA Next Gen by PCR, Nasal     Status: None   Collection Time: 10/15/21  8:23 PM   Specimen: Nasal Mucosa; Nasal Swab  Result Value Ref Range Status   MRSA by PCR Next Gen NOT DETECTED NOT DETECTED Final    Comment: (NOTE) The GeneXpert MRSA Assay (FDA approved for NASAL specimens only), is one component of a comprehensive MRSA colonization surveillance program. It is not intended to diagnose MRSA infection nor to guide or monitor treatment for MRSA infections. Test performance is not FDA approved in patients less than 63 years old. Performed at [facility], [place] [address], [place], [place] [address]      Time coordinating discharge: Over 30 minutes  SIGNED:   Little Ishikawa,  DO [facility] 10/22/2021, 10:15 AM Pager   If 7PM-7AM, please contact night-coverage [email or web address]

## 2021-10-22 NOTE — Progress Notes (Signed)
Cr improving. No plans for vascular surgery intervention.  Will keep currently scheduled follow up   Victorino Sparrow  Vascular Surgery

## 2021-10-22 NOTE — Progress Notes (Addendum)
CSW left a voicemail for [employer] about patient being discharged today, awaiting call back.   UPDATE: CSW updated by RN that [facility] is able to take patient today, but mother will not provide transport to patient now. CSW provided cab voucher for patient to discharge to [facility].  Blenda Nicely, [place] Clinical Social Worker [phone]

## 2021-10-22 NOTE — Plan of Care (Signed)
Pt doing well and ready for rehab.  Problem: Education: Goal: Knowledge of General Education information will improve Description: Including pain rating scale, medication(s)/side effects and non-pharmacologic comfort measures Outcome: Adequate for Discharge   Problem: Health Behavior/Discharge Planning: Goal: Ability to manage health-related needs will improve Outcome: Adequate for Discharge   Problem: Clinical Measurements: Goal: Ability to maintain clinical measurements within normal limits will improve Outcome: Adequate for Discharge Goal: Will remain free from infection Outcome: Adequate for Discharge Goal: Diagnostic test results will improve Outcome: Adequate for Discharge Goal: Respiratory complications will improve Outcome: Adequate for Discharge Goal: Cardiovascular complication will be avoided Outcome: Adequate for Discharge   Problem: Activity: Goal: Risk for activity intolerance will decrease Outcome: Adequate for Discharge   Problem: Nutrition: Goal: Adequate nutrition will be maintained Outcome: Adequate for Discharge   Problem: Coping: Goal: Level of anxiety will decrease Outcome: Adequate for Discharge   Problem: Elimination: Goal: Will not experience complications related to bowel motility Outcome: Adequate for Discharge Goal: Will not experience complications related to urinary retention Outcome: Adequate for Discharge   Problem: Pain Managment: Goal: General experience of comfort will improve Outcome: Adequate for Discharge   Problem: Safety: Goal: Ability to remain free from injury will improve Outcome: Adequate for Discharge   Problem: Skin Integrity: Goal: Risk for impaired skin integrity will decrease Outcome: Adequate for Discharge

## 2021-10-23 ENCOUNTER — Telehealth: Payer: Self-pay

## 2021-10-24 ENCOUNTER — Other Ambulatory Visit: Payer: Self-pay

## 2021-10-24 ENCOUNTER — Emergency Department (HOSPITAL_COMMUNITY): Payer: Medicaid Other

## 2021-10-24 ENCOUNTER — Emergency Department (HOSPITAL_COMMUNITY)
Admission: EM | Admit: 2021-10-24 | Discharge: 2021-10-25 | Disposition: A | Discharge status: Home / Self Care | Payer: Medicaid Other | Attending: Student | Admitting: Student

## 2021-10-24 ENCOUNTER — Encounter (HOSPITAL_COMMUNITY): Payer: Self-pay | Admitting: Emergency Medicine

## 2021-10-24 DIAGNOSIS — R42 Dizziness and giddiness: Secondary | ICD-10-CM | POA: Insufficient documentation

## 2021-10-24 DIAGNOSIS — Z79899 Other long term (current) drug therapy: Secondary | ICD-10-CM | POA: Insufficient documentation

## 2021-10-24 DIAGNOSIS — R739 Hyperglycemia, unspecified: Secondary | ICD-10-CM | POA: Diagnosis not present

## 2021-10-24 DIAGNOSIS — R030 Elevated blood-pressure reading, without diagnosis of hypertension: Secondary | ICD-10-CM

## 2021-10-24 DIAGNOSIS — I10 Essential (primary) hypertension: Secondary | ICD-10-CM | POA: Insufficient documentation

## 2021-10-24 DIAGNOSIS — D649 Anemia, unspecified: Secondary | ICD-10-CM | POA: Diagnosis not present

## 2021-10-24 DIAGNOSIS — Z7982 Long term (current) use of aspirin: Secondary | ICD-10-CM | POA: Diagnosis not present

## 2021-10-24 DIAGNOSIS — R0781 Pleurodynia: Secondary | ICD-10-CM | POA: Insufficient documentation

## 2021-10-24 LAB — CBC WITH DIFFERENTIAL/PLATELET
Abs Immature Granulocytes: 0.02 10*3/uL (ref 0.00–0.07)
Basophils Absolute: 0 10*3/uL (ref 0.0–0.1)
Basophils Relative: 0 %
Eosinophils Absolute: 0 10*3/uL (ref 0.0–0.5)
Eosinophils Relative: 0 %
HCT: 33.9 % — ABNORMAL LOW (ref 36.0–46.0)
Hemoglobin: 11 g/dL — ABNORMAL LOW (ref 12.0–15.0)
Immature Granulocytes: 0 %
Lymphocytes Relative: 30 %
Lymphs Abs: 1.8 10*3/uL (ref 0.7–4.0)
MCH: 28.9 pg (ref 26.0–34.0)
MCHC: 32.4 g/dL (ref 30.0–36.0)
MCV: 89 fL (ref 80.0–100.0)
Monocytes Absolute: 0.4 10*3/uL (ref 0.1–1.0)
Monocytes Relative: 6 %
Neutro Abs: 3.8 10*3/uL (ref 1.7–7.7)
Neutrophils Relative %: 64 %
Platelets: 265 10*3/uL (ref 150–400)
RBC: 3.81 MIL/uL — ABNORMAL LOW (ref 3.87–5.11)
RDW: 14 % (ref 11.5–15.5)
WBC: 6 10*3/uL (ref 4.0–10.5)
nRBC: 0 % (ref 0.0–0.2)

## 2021-10-24 LAB — BASIC METABOLIC PANEL
Anion gap: 7 (ref 5–15)
BUN: 8 mg/dL (ref 6–20)
CO2: 25 mmol/L (ref 22–32)
Calcium: 9 mg/dL (ref 8.9–10.3)
Chloride: 111 mmol/L (ref 98–111)
Creatinine, Ser: 1.26 mg/dL — ABNORMAL HIGH (ref 0.44–1.00)
GFR, Estimated: 51 mL/min — ABNORMAL LOW (ref >60)
Glucose, Bld: 113 mg/dL — ABNORMAL HIGH (ref 70–99)
Potassium: 4.1 mmol/L (ref 3.5–5.1)
Sodium: 143 mmol/L (ref 135–145)

## 2021-10-24 LAB — ETHANOL: Alcohol, Ethyl (B): <10 mg/dL (ref <10)

## 2021-10-24 MED ORDER — MECLIZINE HCL 25 MG PO TABS
25.0000 mg | ORAL_TABLET | Freq: Once | ORAL | Status: AC
  Administered 2021-10-24: 25 mg via ORAL
  Filled 2021-10-24: qty 1

## 2021-10-24 NOTE — ED Triage Notes (Signed)
Pt BIB EMS from [facility] for dizziness and weakness that started today, pt has fallen 3 times today c/o hip/leg pain denies hitting head. HTN meds changed Monday.

## 2021-10-24 NOTE — ED Provider Notes (Incomplete)
[facility] EMERGENCY DEPARTMENT Provider Note   CSN: [id number] Arrival date & time: 10/24/21  2217     History {Add pertinent medical, surgical, social history, OB history to HPI:1} Chief Complaint  Patient presents with   Dizziness    [name] is a 56 y.o. female.  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  Patient states this started today, states she woke up and felt lightheaded dizziness, she describes dizziness as if the room is spinning, it is exacerbated with head movement and change in position, she states that she fell about 3 times to day, she states she did not hit her head or lose conscious, she states that she did fall 1 time onto her left rib is now having pain in that area no difficulty breathing no chest pain or shortness of breath, she states that she felt fine going to bed last night and then woke up with this, she has no associated headache change in vision paresthesias in the upper or lower extremities, she does note that she feels generalized weakness not unilateral, she denies any URI-like symptoms no stomach pains nausea vomiting, she did note some diarrhea, for last couple days.  She denies any illicit drug use, she states she been compliant with all her blood pressure medications.    Home Medications Prior to Admission medications   Medication Sig Start Date End Date Taking? Authorizing Provider  acetaminophen (TYLENOL) 650 MG CR tablet Take 650 mg by mouth every 6 (six) hours as needed for pain.    [provider]  alum & mag hydroxide-simeth (MAALOX/MYLANTA) 200-200-20 MG/5ML suspension Take 30 mLs by mouth every 4 (four) hours as needed for indigestion. Patient not taking: Reported on 10/22/2021    [provider]  aspirin 81 MG chewable tablet Chew 81 mg by mouth  daily.    [provider]  cloNIDine (CATAPRES) 0.2 MG tablet Take 1 tablet (0.2 mg total) by mouth 3 (three) times daily. 10/21/21   Burnadette Pop, MD  Cyanocobalamin (VITAMIN B-12 PO) Take 1 tablet by mouth daily.    [provider]  dicyclomine (BENTYL) 20 MG tablet Take 20 mg by mouth every 6 (six) hours as needed for spasms.    [provider]  Ferrous Sulfate (IRON PO) Take 1 tablet by mouth daily.    [provider]  hydrOXYzine (ATARAX) 25 MG tablet Take 25 mg by mouth every 6 (six) hours as needed for anxiety.    [provider]  labetalol (NORMODYNE) 300 MG tablet Take 1 tablet (300 mg total) by mouth 3 (three) times daily. 10/21/21   Burnadette Pop, MD  loperamide (IMODIUM) 2 MG capsule Take 2-4 mg by mouth as needed for diarrhea or loose stools.    [provider]  Magnesium Hydroxide (MILK OF MAGNESIA PO) Take 30 mLs by mouth daily as needed (constipation).    [provider]  methocarbamol (ROBAXIN) 500 MG tablet Take 500 mg by mouth every 8 (eight) hours as needed for muscle spasms.    [provider]  Multiple Vitamin (MULTIVITAMIN WITH MINERALS) TABS tablet Take 1 tablet by mouth daily.    [provider]  Naproxen Sod-diphenhydrAMINE (ALEVE PM) 220-25 MG TABS Take 2 tablets by mouth at bedtime as needed (For sleep or pain).    [provider]  nicotine (NICODERM CQ - DOSED IN MG/24  HOURS) 14 mg/24hr patch Place 1 patch (14 mg total) onto the skin daily. 10/22/21   Burnadette Pop, MD  ondansetron (ZOFRAN-ODT) 4 MG disintegrating tablet Take 4 mg by mouth every 6 (six) hours as needed for nausea or vomiting.    [provider]  Probiotic Product (PROBIOTIC PO) Take 1 tablet by mouth daily.    [provider]  thiamine 100 MG tablet Take 1 tablet (100 mg total) by mouth daily. 10/22/21   Burnadette Pop, MD      Allergies    Ibuprofen, Benadryl [diphenhydramine], and Sulfa  antibiotics    Review of Systems   Review of Systems  Constitutional:  Negative for chills and fever.  Respiratory:  Negative for shortness of breath.   Cardiovascular:  Negative for chest pain.  Gastrointestinal:  Negative for abdominal pain.  Neurological:  Positive for dizziness and weakness. Negative for headaches.    Physical Exam Updated Vital Signs BP (!) 145/89   Pulse 72   Temp 98.2 F (36.8 C) (Oral)   Resp (!) 24   Ht 5\' 3"  (1.6 m)   Wt 51.6 kg   SpO2 100%   BMI 20.15 kg/m  Physical Exam Vitals and nursing note reviewed.  Constitutional:      General: She is not in acute distress.    Appearance: She is not ill-appearing.  HENT:     Head: Normocephalic and atraumatic.     Comments: No deformity of the head present no raccoon eyes or battle sign noted.    Nose: No congestion.     Mouth/Throat:     Mouth: Mucous membranes are dry.     Pharynx: Oropharynx is clear.  Eyes:     Extraocular Movements: Extraocular movements intact.     Conjunctiva/sclera: Conjunctivae normal.     Pupils: Pupils are equal, round, and reactive to light.  Cardiovascular:     Rate and Rhythm: Normal rate and regular rhythm.     Pulses: Normal pulses.     Heart sounds: No murmur heard.    No friction rub. No gallop.  Pulmonary:     Effort: No respiratory distress.     Breath sounds: No wheezing, rhonchi or rales.  Abdominal:     Palpations: Abdomen is soft.     Tenderness: There is no abdominal tenderness. There is no right CVA tenderness or left CVA tenderness.  Skin:    General: Skin is warm and dry.  Neurological:     Mental Status: She is alert.     Comments: Patient alert orient x4, cranial nerves II through XII grossly intact no difficulty with word finding following two-step commands no real weakness present, horizontal Was present with beats going to the left temporal lobe.  Psychiatric:        Mood and Affect: Mood normal.     Comments: She seems a little somnolent but  easily arousable, maintaining oral airways, no endorsing suicidal homicidal rations denies does not appear to be respond to internal stimuli.     ED Results / Procedures / Treatments   Labs (all labs ordered are listed, but only abnormal results are displayed) Labs Reviewed  CBC WITH DIFFERENTIAL/PLATELET - Abnormal; Notable for the following components:      Result Value   RBC 3.81 (*)    Hemoglobin 11.0 (*)    HCT 33.9 (*)    All other components within normal limits  BASIC METABOLIC PANEL  RAPID URINE DRUG SCREEN, HOSP PERFORMED  URINALYSIS, ROUTINE  W REFLEX MICROSCOPIC  ETHANOL    EKG None  Radiology No results found.  Procedures Procedures  {Document cardiac monitor, telemetry assessment procedure when appropriate:1}  Medications Ordered in ED Medications  meclizine (ANTIVERT) tablet 25 mg (25 mg Oral Given 10/24/21 2310)    ED Course/ Medical Decision Making/ A&P                           Medical Decision Making Amount and/or Complexity of Data Reviewed Labs: ordered. Radiology: ordered.   This patient presents to the ED for concern of dizziness, this involves an extensive number of treatment options, and is a complaint that carries with it a high risk of complications and morbidity.  The differential diagnosis includes vertigo, cerebellar infarct, metabolic derailment, meningitis    Additional history obtained:  Additional history obtained from N/A External records from outside source obtained and reviewed including previous discharge summary   Co morbidities that complicate the patient evaluation  Polysubstance dependency  Social Determinants of Health:  N/A    Lab Tests:  I Ordered, and personally interpreted labs.  The pertinent results include: CBC shows normocytic anemia hemoglobin 11, BMP shows glucose of 113 creatinine 1.26 GFR 51, ethanol less than 10   Imaging Studies ordered:  I ordered imaging studies including CT head I  independently visualized and interpreted imaging which showed *** I agree with the radiologist interpretation   Cardiac Monitoring:  The patient was maintained on a cardiac monitor.  I personally viewed and interpreted the cardiac monitored which showed an underlying rhythm of: ***   Medicines ordered and prescription drug management:  I ordered medication including meclizine I have reviewed the patients home medicines and have made adjustments as needed  Critical Interventions:  ***   Reevaluation:  Presents with dizziness, seems consistent with vertigo as she also has horizontal nystagmus, will provide with meclizine, obtain CT head, basic lab work-up and reassess.  Consultations Obtained:  I requested consultation with the ***,  and discussed lab and imaging findings as well as pertinent plan - they recommend: ***    Test Considered:  ***    Rule out ****    Dispostion and problem list  After consideration of the diagnostic results and the patients response to treatment, I feel that the patent would benefit from ***.       {Document critical care time when appropriate:1} {Document review of labs and clinical decision tools ie heart score, Chads2Vasc2 etc:1}  {Document your independent review of radiology images, and any outside records:1} {Document your discussion with family members, caretakers, and with consultants:1} {Document social determinants of health affecting pt's care:1} {Document your decision making why or why not admission, treatments were needed:1} Final Clinical Impression(s) / ED Diagnoses Final diagnoses:  None    Rx / DC Orders ED Discharge Orders     None

## 2021-10-25 ENCOUNTER — Emergency Department (HOSPITAL_COMMUNITY): Payer: Medicaid Other

## 2021-10-25 LAB — URINALYSIS, ROUTINE W REFLEX MICROSCOPIC
Bacteria, UA: NONE SEEN
Bilirubin Urine: NEGATIVE
Glucose, UA: NEGATIVE mg/dL
Hgb urine dipstick: NEGATIVE
Ketones, ur: NEGATIVE mg/dL
Nitrite: NEGATIVE
Protein, ur: NEGATIVE mg/dL
Specific Gravity, Urine: 1.006 (ref 1.005–1.030)
pH: 7 (ref 5.0–8.0)

## 2021-10-25 LAB — RAPID URINE DRUG SCREEN, HOSP PERFORMED
Amphetamines: NOT DETECTED
Barbiturates: NOT DETECTED
Benzodiazepines: POSITIVE — AB
Cocaine: POSITIVE — AB
Opiates: NOT DETECTED
Tetrahydrocannabinol: NOT DETECTED

## 2021-10-25 MED ORDER — MECLIZINE HCL 25 MG PO TABS: 25.0000 mg | ORAL_TABLET | Freq: Three times a day (TID) | ORAL | 0 refills | Status: AC | PRN

## 2021-10-25 MED ORDER — CYCLOBENZAPRINE HCL 10 MG PO TABS
10.0000 mg | ORAL_TABLET | Freq: Once | ORAL | Status: AC
  Administered 2021-10-25: 10 mg via ORAL
  Filled 2021-10-25: qty 1

## 2021-10-25 MED ORDER — CLONIDINE HCL 0.2 MG PO TABS
0.2000 mg | ORAL_TABLET | Freq: Once | ORAL | Status: AC
  Administered 2021-10-25: 0.2 mg via ORAL
  Filled 2021-10-25: qty 1

## 2021-10-25 MED ORDER — CYCLOBENZAPRINE HCL 10 MG PO TABS: 10.0000 mg | ORAL_TABLET | Freq: Two times a day (BID) | ORAL | 0 refills | Status: AC | PRN

## 2021-10-25 NOTE — ED Notes (Signed)
Patient transported to CT 

## 2021-10-25 NOTE — Discharge Instructions (Signed)
Dizziness-given you meclizine please take as prescribed May follow-up with neurology for further evaluation. Elevated blood pressure-please continue with all your blood pressure medications, please follow-up with your PCP for reassessment. Rib pain-May use over-the-counter pain occasion follow-up PCP for further evaluation.  I have given you a muscle relaxer.  Come back to the emergency department if you develop chest pain, shortness of breath, severe abdominal pain, uncontrolled nausea, vomiting, diarrhea.

## 2021-10-25 NOTE — ED Notes (Signed)
Taxi transport back to [facility] approved by [facility]. Patient escorted to taxi by this RN. Discharge paperwork reviewed and patient verbalized understanding.

## 2021-10-25 NOTE — ED Notes (Signed)
Pt returned from CT and placed back on cardiac monitor 

## 2021-10-30 NOTE — Telephone Encounter (Signed)
No return call yet to schedule. 

## 2021-11-17 ENCOUNTER — Other Ambulatory Visit: Payer: Self-pay

## 2021-11-17 ENCOUNTER — Emergency Department (HOSPITAL_COMMUNITY): Payer: Medicaid Other

## 2021-11-17 ENCOUNTER — Emergency Department (HOSPITAL_COMMUNITY)
Admission: EM | Admit: 2021-11-17 | Discharge: 2021-11-18 | Disposition: A | Discharge status: Home / Self Care | Payer: Medicaid Other | Attending: Emergency Medicine | Admitting: Emergency Medicine

## 2021-11-17 DIAGNOSIS — T401X1A Poisoning by heroin, accidental (unintentional), initial encounter: Secondary | ICD-10-CM | POA: Diagnosis present

## 2021-11-17 DIAGNOSIS — Z7982 Long term (current) use of aspirin: Secondary | ICD-10-CM | POA: Insufficient documentation

## 2021-11-17 DIAGNOSIS — I10 Essential (primary) hypertension: Secondary | ICD-10-CM | POA: Diagnosis not present

## 2021-11-17 DIAGNOSIS — Z79899 Other long term (current) drug therapy: Secondary | ICD-10-CM | POA: Diagnosis not present

## 2021-11-17 DIAGNOSIS — R112 Nausea with vomiting, unspecified: Secondary | ICD-10-CM

## 2021-11-17 LAB — COMPREHENSIVE METABOLIC PANEL
ALT: 21 U/L (ref 0–44)
AST: 24 U/L (ref 15–41)
Albumin: 4.3 g/dL (ref 3.5–5.0)
Alkaline Phosphatase: 57 U/L (ref 38–126)
Anion gap: 9 (ref 5–15)
BUN: 20 mg/dL (ref 6–20)
CO2: 30 mmol/L (ref 22–32)
Calcium: 9.6 mg/dL (ref 8.9–10.3)
Chloride: 101 mmol/L (ref 98–111)
Creatinine, Ser: 1.71 mg/dL — ABNORMAL HIGH (ref 0.44–1.00)
GFR, Estimated: 35 mL/min — ABNORMAL LOW (ref >60)
Glucose, Bld: 170 mg/dL — ABNORMAL HIGH (ref 70–99)
Potassium: 4.7 mmol/L (ref 3.5–5.1)
Sodium: 140 mmol/L (ref 135–145)
Total Bilirubin: 0.6 mg/dL (ref 0.3–1.2)
Total Protein: 7.4 g/dL (ref 6.5–8.1)

## 2021-11-17 LAB — CBC WITH DIFFERENTIAL/PLATELET
Abs Immature Granulocytes: 0.02 10*3/uL (ref 0.00–0.07)
Basophils Absolute: 0 10*3/uL (ref 0.0–0.1)
Basophils Relative: 0 %
Eosinophils Absolute: 0 10*3/uL (ref 0.0–0.5)
Eosinophils Relative: 0 %
HCT: 41.8 % (ref 36.0–46.0)
Hemoglobin: 13 g/dL (ref 12.0–15.0)
Immature Granulocytes: 0 %
Lymphocytes Relative: 28 %
Lymphs Abs: 1.8 10*3/uL (ref 0.7–4.0)
MCH: 28.3 pg (ref 26.0–34.0)
MCHC: 31.1 g/dL (ref 30.0–36.0)
MCV: 90.9 fL (ref 80.0–100.0)
Monocytes Absolute: 0.3 10*3/uL (ref 0.1–1.0)
Monocytes Relative: 5 %
Neutro Abs: 4.2 10*3/uL (ref 1.7–7.7)
Neutrophils Relative %: 67 %
Platelets: 300 10*3/uL (ref 150–400)
RBC: 4.6 MIL/uL (ref 3.87–5.11)
RDW: 13.9 % (ref 11.5–15.5)
WBC: 6.4 10*3/uL (ref 4.0–10.5)
nRBC: 0 % (ref 0.0–0.2)

## 2021-11-17 LAB — CBG MONITORING, ED: Glucose-Capillary: 190 mg/dL — ABNORMAL HIGH (ref 70–99)

## 2021-11-17 LAB — SALICYLATE LEVEL: Salicylate Lvl: <7 mg/dL — ABNORMAL LOW (ref 7.0–30.0)

## 2021-11-17 LAB — ETHANOL: Alcohol, Ethyl (B): <10 mg/dL (ref <10)

## 2021-11-17 LAB — ACETAMINOPHEN LEVEL: Acetaminophen (Tylenol), Serum: <10 ug/mL — ABNORMAL LOW (ref 10–30)

## 2021-11-17 MED ORDER — SODIUM CHLORIDE 0.9 % IV BOLUS
1000.0000 mL | Freq: Once | INTRAVENOUS | Status: AC
Start: 2021-11-17 — End: 2021-11-17
  Administered 2021-11-17: 1000 mL via INTRAVENOUS

## 2021-11-17 MED ORDER — ONDANSETRON HCL 4 MG/2ML IJ SOLN
4.0000 mg | Freq: Once | INTRAMUSCULAR | Status: AC
Start: 2021-11-17 — End: 2021-11-17
  Administered 2021-11-17: 4 mg via INTRAVENOUS
  Filled 2021-11-17: qty 2

## 2021-11-17 MED ORDER — ACETAMINOPHEN 500 MG PO TABS
1000.0000 mg | ORAL_TABLET | Freq: Once | ORAL | Status: AC
  Administered 2021-11-17: 1000 mg via ORAL
  Filled 2021-11-17: qty 2

## 2021-11-17 MED ORDER — SODIUM CHLORIDE 0.9 % IV SOLN
12.5000 mg | Freq: Four times a day (QID) | INTRAVENOUS | Status: DC | PRN
  Administered 2021-11-18: 12.5 mg via INTRAVENOUS
  Filled 2021-11-17: qty 12.5

## 2021-11-17 MED ORDER — NALOXONE HCL 4 MG/0.1ML NA LIQD: 1.0000 | Freq: Once | NASAL | 0 refills | Status: AC

## 2021-11-17 MED ORDER — SODIUM CHLORIDE 0.9 % IV SOLN: INTRAVENOUS | Status: DC

## 2021-11-17 MED ORDER — ONDANSETRON HCL 4 MG/2ML IJ SOLN
4.0000 mg | Freq: Once | INTRAMUSCULAR | Status: AC
  Administered 2021-11-17: 4 mg via INTRAVENOUS
  Filled 2021-11-17: qty 2

## 2021-11-17 MED ORDER — NALOXONE HCL 2 MG/2ML IJ SOSY
PREFILLED_SYRINGE | INTRAMUSCULAR | Status: AC
  Filled 2021-11-17: qty 2

## 2021-11-17 MED ORDER — SODIUM CHLORIDE 0.9 % IV BOLUS
1000.0000 mL | Freq: Once | INTRAVENOUS | Status: AC
  Administered 2021-11-17: 1000 mL via INTRAVENOUS

## 2021-11-17 NOTE — ED Notes (Signed)
Oxygen improved to 93% on 3L. Provider at bedside

## 2021-11-17 NOTE — ED Provider Triage Note (Signed)
Emergency Medicine Provider Triage Evaluation Note  Patient arrives through the main emergency room entrance after reportedly being dropped off for a possible overdose. On my initial evaluation patient does not respond to painful stimuli, is nonverbal without purposeful or meaningful movements.  She has occasionally irregular respirations. Her pupils are pinpoint. CBG was checked and was not low.  She has a history of polysubstance abuse on chart review. She is given 2 mg Narcan right arm IM. She did briefly require assisted respirations by my self with BVM due to prolonging periods of apnea.   Approximately 3 minutes after Narcan administration IM patient began to regain consciousness. At initial evaluation she was extremely diaphoretic. She was oriented to location knowing she was at the hospital.  She admits that she snorted heroin today.  She had been clean for 3 weeks.  She states she has overdosed before.   Patient was taken to treatment room in the main part of the emergency room. I spoke with MD and PA in that pod to discuss patient's presentation, and treatment she had already received.     Cristina Gong, [place] 11/17/21 2046

## 2021-11-17 NOTE — ED Notes (Signed)
Pt now awake, alert, responding to questions appropriately. Admits to snorting heroin PTA.

## 2021-11-17 NOTE — ED Notes (Signed)
Pt continuing to report nausea. [place] MD made aware

## 2021-11-17 NOTE — ED Provider Notes (Signed)
[facility] EMERGENCY DEPARTMENT Provider Note   CSN: [id number] Arrival date & time: 11/17/21  2019     History  Chief Complaint  Patient presents with   Drug Overdose    [name] is a 56 y.o. female.  Pt is a 56 yo female with a pmhx significant for polysubstance abuse, htn, high cholesterol, and a type B aortic dissection.  Pt was dropped off by her husband unresponsive and not breathing.  Pt was given 2 mg narcan IM by the triage PA.  She woke up and admitted to snorting heroin after being clean for 3 weeks.  She feels nauseous now.  No cp.         Home Medications Prior to Admission medications   Medication Sig Start Date End Date Taking? Authorizing Provider  naloxone [facility]) nasal spray 4 mg/0.1 mL Place 1 spray into the nose once for 1 dose. 11/17/21 11/17/21 Yes Jacalyn Lefevre, MD  acetaminophen (TYLENOL) 650 MG CR tablet Take 650 mg by mouth every 6 (six) hours as needed for pain.    [provider]  alum & mag hydroxide-simeth (MAALOX/MYLANTA) 200-200-20 MG/5ML suspension Take 30 mLs by mouth every 4 (four) hours as needed for indigestion. Patient not taking: Reported on 10/22/2021    [provider]  aspirin 81 MG chewable tablet Chew 81 mg by mouth daily.    [provider]  cloNIDine (CATAPRES) 0.2 MG tablet Take 1 tablet (0.2 mg total) by mouth 3 (three) times daily. 10/21/21   Burnadette Pop, MD  Cyanocobalamin (VITAMIN B-12 PO) Take 1 tablet by mouth daily.    [provider]  cyclobenzaprine (FLEXERIL) 10 MG tablet Take 1 tablet (10 mg total) by mouth 2 (two) times daily as needed for muscle spasms. 10/25/21   Carroll Sage, PA-C  dicyclomine (BENTYL) 20 MG tablet Take 20 mg by mouth every 6 (six) hours as needed for spasms.    [provider]  Ferrous Sulfate (IRON PO) Take 1 tablet by mouth daily.    [provider]  hydrOXYzine (ATARAX) 25 MG tablet Take 25 mg by mouth every 6  (six) hours as needed for anxiety.    [provider]  labetalol (NORMODYNE) 300 MG tablet Take 1 tablet (300 mg total) by mouth 3 (three) times daily. 10/21/21   Burnadette Pop, MD  loperamide (IMODIUM) 2 MG capsule Take 2-4 mg by mouth as needed for diarrhea or loose stools.    [provider]  Magnesium Hydroxide (MILK OF MAGNESIA PO) Take 30 mLs by mouth daily as needed (constipation).    [provider]  meclizine (ANTIVERT) 25 MG tablet Take 1 tablet (25 mg total) by mouth 3 (three) times daily as needed for dizziness. 10/25/21   Carroll Sage, PA-C  methocarbamol (ROBAXIN) 500 MG tablet Take 500 mg by mouth every 8 (eight) hours as needed for muscle spasms.    [provider]  Multiple Vitamin (MULTIVITAMIN WITH MINERALS) TABS tablet Take 1 tablet by mouth daily.    [provider]  Naproxen Sod-diphenhydrAMINE (ALEVE PM) 220-25 MG TABS Take 2 tablets by mouth at bedtime as needed (For sleep or pain).    [provider]  nicotine (NICODERM CQ - DOSED IN MG/24 HOURS) 14 mg/24hr patch Place 1 patch (14 mg total) onto the skin daily. 10/22/21   Burnadette Pop, MD  ondansetron (ZOFRAN-ODT) 4 MG disintegrating tablet Take 4 mg by mouth every 6 (six) hours as needed for  nausea or vomiting.    [provider]  Probiotic Product (PROBIOTIC PO) Take 1 tablet by mouth daily.    [provider]  thiamine 100 MG tablet Take 1 tablet (100 mg total) by mouth daily. 10/22/21   Burnadette Pop, MD      Allergies    Ibuprofen, Benadryl [diphenhydramine], and Sulfa antibiotics    Review of Systems   Review of Systems  Gastrointestinal:  Positive for nausea.  All other systems reviewed and are negative.   Physical Exam Updated Vital Signs BP (!) 158/94   Pulse 92   Temp 98.6 F (37 C) (Oral)   Resp 14   Ht 5\' 3"  (1.6 m)   Wt 52.2 kg   SpO2 100%   BMI 20.37 kg/m  Physical Exam Vitals and nursing note reviewed.   Constitutional:      Appearance: Normal appearance. She is diaphoretic.  HENT:     Head: Normocephalic and atraumatic.     Right Ear: External ear normal.     Left Ear: External ear normal.     Nose: Nose normal.     Mouth/Throat:     Mouth: Mucous membranes are moist.     Pharynx: Oropharynx is clear.  Eyes:     Extraocular Movements: Extraocular movements intact.     Conjunctiva/sclera: Conjunctivae normal.     Pupils: Pupils are equal, round, and reactive to light.  Cardiovascular:     Rate and Rhythm: Regular rhythm. Tachycardia present.     Pulses: Normal pulses.     Heart sounds: Normal heart sounds.  Pulmonary:     Effort: Pulmonary effort is normal.     Breath sounds: Normal breath sounds.  Abdominal:     General: Abdomen is flat. Bowel sounds are normal.     Palpations: Abdomen is soft.  Musculoskeletal:        General: Normal range of motion.     Cervical back: Normal range of motion and neck supple.  Skin:    General: Skin is warm.     Capillary Refill: Capillary refill takes less than 2 seconds.  Neurological:     General: No focal deficit present.     Mental Status: She is alert and oriented to person, place, and time.  Psychiatric:        Mood and Affect: Mood normal.        Behavior: Behavior normal.     ED Results / Procedures / Treatments   Labs (all labs ordered are listed, but only abnormal results are displayed) Labs Reviewed  COMPREHENSIVE METABOLIC PANEL - Abnormal; Notable for the following components:      Result Value   Glucose, Bld 170 (*)    Creatinine, Ser 1.71 (*)    GFR, Estimated 35 (*)    All other components within normal limits  SALICYLATE LEVEL - Abnormal; Notable for the following components:   Salicylate Lvl <7.0 (*)    All other components within normal limits  ACETAMINOPHEN LEVEL - Abnormal; Notable for the following components:   Acetaminophen (Tylenol), Serum <10 (*)    All other components within normal limits  CBG  MONITORING, ED - Abnormal; Notable for the following components:   Glucose-Capillary 190 (*)    All other components within normal limits  ETHANOL  CBC WITH DIFFERENTIAL/PLATELET  RAPID URINE DRUG SCREEN, HOSP PERFORMED    EKG EKG Interpretation  Date/Time:  Tuesday November 17 2021 21:03:03 EDT Ventricular Rate:  103 PR Interval:  156  QRS Duration: 84 QT Interval:  352 QTC Calculation: 461 R Axis:   100 Text Interpretation: Sinus tachycardia Probable left atrial enlargement Right axis deviation Abnormal R-wave progression, late transition Since last tracing rate faster Confirmed by Jacalyn Lefevre [phone]) on 11/17/2021 9:27:17 PM  Radiology DG Chest Port 1 View  Result Date: 11/17/2021 CLINICAL DATA:  Overdose EXAM: PORTABLE CHEST 1 VIEW COMPARISON:  10/21/2021, chest CT 10/17/2021 FINDINGS: No acute airspace disease. Stable enlarged mediastinal silhouette, corresponding to history of known dissection and aneurysmal dilatation. Normal cardiac size. No pneumothorax IMPRESSION: No active disease. Stable enlarged mediastinal silhouette corresponding to history of dissection and aneurysmal dilatation as seen on prior CT Electronically Signed   By: Jasmine Pang M.D.   On: 11/17/2021 21:31    Procedures Procedures    Medications Ordered in ED Medications  sodium chloride 0.9 % bolus 1,000 mL (0 mLs Intravenous Stopped 11/17/21 2205)    And  0.9 %  sodium chloride infusion (has no administration in time range)  promethazine (PHENERGAN) 12.5 mg in sodium chloride 0.9 % 50 mL IVPB (has no administration in time range)  naloxone [facility]) 2 MG/2ML injection (  Given by Other 11/17/21 2027)  sodium chloride 0.9 % bolus 1,000 mL (1,000 mLs Intravenous New Bag/Given 11/17/21 2209)  ondansetron (ZOFRAN) injection 4 mg (4 mg Intravenous Given 11/17/21 2058)  acetaminophen (TYLENOL) tablet 1,000 mg (1,000 mg Oral Given 11/17/21 2207)  ondansetron (ZOFRAN) injection 4 mg (4 mg Intravenous Given 11/17/21  2206)    ED Course/ Medical Decision Making/ A&P                           Medical Decision Making Amount and/or Complexity of Data Reviewed Labs: ordered. Radiology: ordered.  Risk OTC drugs. Prescription drug management.   This patient presents to the ED for concern of drug overdose, this involves an extensive number of treatment options, and is a complaint that carries with it a high risk of complications and morbidity.  The differential diagnosis includes drug overdose, infection, cardiac problem, electrolyte abn   Co morbidities that complicate the patient evaluation  polysubstance abuse, htn, high cholesterol, and a type B aortic dissection   Additional history obtained:  Additional history obtained from epic chart review   Lab Tests:  I Ordered, and personally interpreted labs.  The pertinent results include:  cbc nl; cmp nl other than glucose elevated at 170 and Cr 1.71 (chronic for pt)   Imaging Studies ordered:  I ordered imaging studies including cxr  I independently visualized and interpreted imaging which showed  IMPRESSION:  No active disease. Stable enlarged mediastinal silhouette  corresponding to history of dissection and aneurysmal dilatation as  seen on prior CT   I agree with the radiologist interpretation   Cardiac Monitoring:  The patient was maintained on a cardiac monitor.  I personally viewed and interpreted the cardiac monitored which showed an underlying rhythm of: sinus tachycardia initially, now nsr   Medicines ordered and prescription drug management:  I ordered medication including zofran and ivfs  for withdrawal sx  Reevaluation of the patient after these medicines showed that the patient improved I have reviewed the patients home medicines and have made adjustments as needed  Critical Interventions:  narcan  Problem List / ED Course:  Heroin overdose:  Pt received narcan and woke up.  She has been awake and alert since  the narcan.  However, she still feels very nauseous even  after 8 mg zofran.  Phenergan ordered.  Pt signed out to Dr. Wilkie Aye at shift change.  I anticipate she can go home if she feels better.  She is d/c with a rx for narcan.  She is to return if worse.  Reevaluation:  After the interventions noted above, I reevaluated the patient and found that they have :improved   Social Determinants of Health:  Lives at home   Dispostion:  After consideration of the diagnostic results and the patients response to treatment, I feel that the patent would benefit from discharge with outpatient f/u.          Final Clinical Impression(s) / ED Diagnoses Final diagnoses:  Accidental overdose of heroin, initial encounter (HCC)  Nausea and vomiting, unspecified vomiting type    Rx / DC Orders ED Discharge Orders          Ordered    naloxone (NARCAN) nasal spray 4 mg/0.1 mL   Once        11/17/21 2219              Jacalyn Lefevre, MD 11/17/21 2314

## 2021-11-17 NOTE — ED Notes (Signed)
ED Provider at bedside. 

## 2021-11-17 NOTE — ED Triage Notes (Signed)
Pt dropped off by boyfriend with AMS. Not responsive to verbal or painful stimuli, and shallow breathing. 2mg  IM narcan administered.

## 2021-11-18 LAB — RAPID URINE DRUG SCREEN, HOSP PERFORMED
Amphetamines: NOT DETECTED
Barbiturates: NOT DETECTED
Benzodiazepines: NOT DETECTED
Cocaine: POSITIVE — AB
Opiates: POSITIVE — AB
Tetrahydrocannabinol: NOT DETECTED

## 2021-11-18 NOTE — ED Notes (Signed)
RN reviewed discharge instructions with pt. Pt verbalized understanding and had no further questions. VSS upon discharge.  

## 2021-11-18 NOTE — ED Notes (Signed)
Pt given Nyman crackers and soda, tolerated well.

## 2021-12-16 ENCOUNTER — Emergency Department (HOSPITAL_COMMUNITY)
Admission: EM | Admit: 2021-12-16 | Discharge: 2021-12-16 | Disposition: A | Discharge status: Home / Self Care | Payer: Medicaid Other | Attending: Emergency Medicine | Admitting: Emergency Medicine

## 2021-12-16 DIAGNOSIS — T402X1A Poisoning by other opioids, accidental (unintentional), initial encounter: Secondary | ICD-10-CM | POA: Diagnosis not present

## 2021-12-16 DIAGNOSIS — Z79899 Other long term (current) drug therapy: Secondary | ICD-10-CM | POA: Diagnosis not present

## 2021-12-16 DIAGNOSIS — R464 Slowness and poor responsiveness: Secondary | ICD-10-CM | POA: Diagnosis not present

## 2021-12-16 DIAGNOSIS — T40604A Poisoning by unspecified narcotics, undetermined, initial encounter: Secondary | ICD-10-CM

## 2021-12-16 LAB — CBG MONITORING, ED: Glucose-Capillary: 228 mg/dL — ABNORMAL HIGH (ref 70–99)

## 2021-12-16 MED ORDER — NALOXONE HCL 4 MG/0.1ML NA LIQD: NASAL | 0 refills | Status: AC

## 2021-12-16 MED ORDER — ONDANSETRON 4 MG PO TBDP
8.0000 mg | ORAL_TABLET | Freq: Once | ORAL | Status: AC
  Administered 2021-12-16: 8 mg via ORAL
  Filled 2021-12-16: qty 2

## 2021-12-16 MED ORDER — NALOXONE HCL 4 MG/0.1ML NA LIQD
NASAL | Status: AC
  Filled 2021-12-16: qty 4

## 2021-12-16 NOTE — ED Provider Notes (Signed)
[facility] EMERGENCY DEPARTMENT Provider Note   CSN: [id number] Arrival date & time: 12/16/21  1716     History  No chief complaint on file.   [name] is a 56 y.o. female.  56 year old female presents after being found unresponsive today by a family member.  She was brought into the ED emergently.  Was noted to have taken something according to the person brought the patient in.  Patient does have a history of overdose in the past.  No further history obtainable due to her current state       Home Medications Prior to Admission medications   Medication Sig Start Date End Date Taking? Authorizing Provider  acetaminophen (TYLENOL) 650 MG CR tablet Take 650 mg by mouth every 6 (six) hours as needed for pain.    [provider]  alum & mag hydroxide-simeth (MAALOX/MYLANTA) 200-200-20 MG/5ML suspension Take 30 mLs by mouth every 4 (four) hours as needed for indigestion. Patient not taking: Reported on 10/22/2021    [provider]  aspirin 81 MG chewable tablet Chew 81 mg by mouth daily.    [provider]  cloNIDine (CATAPRES) 0.2 MG tablet Take 1 tablet (0.2 mg total) by mouth 3 (three) times daily. 10/21/21   Burnadette Pop, MD  Cyanocobalamin (VITAMIN B-12 PO) Take 1 tablet by mouth daily.    [provider]  cyclobenzaprine (FLEXERIL) 10 MG tablet Take 1 tablet (10 mg total) by mouth 2 (two) times daily as needed for muscle spasms. 10/25/21   Carroll Sage, PA-C  dicyclomine (BENTYL) 20 MG tablet Take 20 mg by mouth every 6 (six) hours as needed for spasms.    [provider]  Ferrous Sulfate (IRON PO) Take 1 tablet by mouth daily.    [provider]  hydrOXYzine (ATARAX) 25 MG tablet Take 25 mg by mouth every 6 (six) hours as needed for anxiety.    [provider]  labetalol (NORMODYNE) 300 MG tablet Take 1 tablet (300 mg total) by mouth 3 (three) times daily. 10/21/21   Burnadette Pop, MD   loperamide (IMODIUM) 2 MG capsule Take 2-4 mg by mouth as needed for diarrhea or loose stools.    [provider]  Magnesium Hydroxide (MILK OF MAGNESIA PO) Take 30 mLs by mouth daily as needed (constipation).    [provider]  meclizine (ANTIVERT) 25 MG tablet Take 1 tablet (25 mg total) by mouth 3 (three) times daily as needed for dizziness. 10/25/21   Carroll Sage, PA-C  methocarbamol (ROBAXIN) 500 MG tablet Take 500 mg by mouth every 8 (eight) hours as needed for muscle spasms.    [provider]  Multiple Vitamin (MULTIVITAMIN WITH MINERALS) TABS tablet Take 1 tablet by mouth daily.    [provider]  Naproxen Sod-diphenhydrAMINE (ALEVE PM) 220-25 MG TABS Take 2 tablets by mouth at bedtime as needed (For sleep or pain).    [provider]  nicotine (NICODERM CQ - DOSED IN MG/24 HOURS) 14 mg/24hr patch Place 1 patch (14 mg total) onto the skin daily. 10/22/21   Burnadette Pop, MD  ondansetron (ZOFRAN-ODT) 4 MG disintegrating tablet Take 4 mg by mouth every 6 (six) hours as needed for nausea or vomiting.    [provider]  Probiotic Product (PROBIOTIC PO) Take 1 tablet by mouth daily.    [provider]  thiamine 100 MG tablet Take 1 tablet (100 mg total) by mouth daily. 10/22/21   Burnadette Pop,  MD      Allergies    Ibuprofen, Benadryl [diphenhydramine], and Sulfa antibiotics    Review of Systems   Review of Systems  Unable to perform ROS: Mental status change    Physical Exam Updated Vital Signs Temp (!) 97 F (36.1 C)  Physical Exam Vitals and nursing note reviewed.  Constitutional:      General: She is not in acute distress.    Appearance: Normal appearance. She is well-developed. She is not toxic-appearing.  HENT:     Head: Normocephalic and atraumatic.  Eyes:     General: Lids are normal.     Conjunctiva/sclera: Conjunctivae normal.     Pupils: Pupils are equal, round, and reactive to light.  Neck:      Thyroid: No thyroid mass.     Trachea: No tracheal deviation.  Cardiovascular:     Rate and Rhythm: Normal rate and regular rhythm.     Heart sounds: Normal heart sounds. No murmur heard.    No gallop.  Pulmonary:     Effort: Pulmonary effort is normal. No respiratory distress.     Breath sounds: Normal breath sounds. No stridor. No decreased breath sounds, wheezing, rhonchi or rales.  Abdominal:     General: There is no distension.     Palpations: Abdomen is soft.     Tenderness: There is no abdominal tenderness. There is no rebound.  Musculoskeletal:        General: No tenderness. Normal range of motion.     Cervical back: Normal range of motion and neck supple.  Skin:    General: Skin is warm and dry.     Findings: No abrasion or rash.  Neurological:     Mental Status: She is disoriented, confused and unresponsive.     GCS: GCS eye subscore is 3. GCS verbal subscore is 4. GCS motor subscore is 5.  Psychiatric:        Attention and Perception: She is inattentive.        Speech: She is noncommunicative.     ED Results / Procedures / Treatments   Labs (all labs ordered are listed, but only abnormal results are displayed) Labs Reviewed  CBG MONITORING, ED - Abnormal; Notable for the following components:      Result Value   Glucose-Capillary 228 (*)    All other components within normal limits    EKG EKG Interpretation  Date/Time:  Wednesday December 16 2021 17:22:32 EDT Ventricular Rate:  119 PR Interval:  156 QRS Duration: 90 QT Interval:  333 QTC Calculation: 469 R Axis:   122 Text Interpretation: Sinus tachycardia Right axis deviation Confirmed by Lorre Nick ([id number]) on 12/16/2021 7:20:12 PM  Radiology No results found.  Procedures Procedures    Medications Ordered in ED Medications - No data to display  ED Course/ Medical Decision Making/ A&P                           Medical Decision Making Risk Prescription drug management.  Patient is EKG  per my interpretation shows sinus tachycardia. Patient presented with altered status.  Was given Narcan intranasal by nursing.  Patient woke up.  Admits that she used fentanyl.  Was monitored here and has been no rebound reaction.  She is requesting to home at this time.  States that she was trying to become intoxicated.  This was not a suicide attempt.  Will be discharged with prescription for Narcan  Final Clinical Impression(s) / ED Diagnoses Final diagnoses:  None    Rx / DC Orders ED Discharge Orders     None         Lorre Nick, MD 12/16/21 1920

## 2021-12-16 NOTE — ED Notes (Addendum)
Pt was standing at door when RN arrived for shift change.  Pt was agitated stating she needed to go.  She asked if she could go to the bathroom, pt ambulated w/o any difficulties.  Provider aware.  Pt wanted to leave and RN found her in the hall looking for the way out but she was verbally redirected back to room.  RN removed IV.  Pharmacist provided Narcan which was given to pt. While calm, she refused discharge vitals or to allow RN time to scan medication.  She did accept discharge paperwork.  Pt stated spouse was coming to pick her up.

## 2021-12-16 NOTE — ED Triage Notes (Signed)
Pt BIB POV & dropped off at lobby, unresponsive. Was brought to a room & Narcan given, woke up & admitted to Fentanyl.

## 2022-05-02 NOTE — Congregational Nurse Program (Signed)
Nurse met with client her adult daughter and grandson. They are here to be admitted to the family shelter today and need Covid screening completed. All three have had at least one vaccine. No symptoms today. Client reports no medical issues except high blood pressure for which she takes medication and has it with her. States that she does take advil pm for sleep at night. She does have a PCP. Plan: Nurse  offered  support and encouraged to continue medications as ordered and to let nurse know if any medical mental health needs arise while at shelter. Covid test was negative, encouraged booster vaccines.  [name] D. Nancy Caraway MSN, [place] [employer] [phone]

## 2022-05-14 ENCOUNTER — Other Ambulatory Visit: Payer: Self-pay

## 2022-05-14 ENCOUNTER — Emergency Department (HOSPITAL_COMMUNITY)
Admission: EM | Admit: 2022-05-14 | Discharge: 2022-05-15 | Disposition: A | Discharge status: Home / Self Care | Attending: Emergency Medicine | Admitting: Emergency Medicine

## 2022-05-14 DIAGNOSIS — R911 Solitary pulmonary nodule: Secondary | ICD-10-CM | POA: Insufficient documentation

## 2022-05-14 DIAGNOSIS — Z7982 Long term (current) use of aspirin: Secondary | ICD-10-CM | POA: Insufficient documentation

## 2022-05-14 DIAGNOSIS — I10 Essential (primary) hypertension: Secondary | ICD-10-CM | POA: Diagnosis not present

## 2022-05-14 DIAGNOSIS — S0003XA Contusion of scalp, initial encounter: Secondary | ICD-10-CM | POA: Diagnosis not present

## 2022-05-14 DIAGNOSIS — W01198A Fall on same level from slipping, tripping and stumbling with subsequent striking against other object, initial encounter: Secondary | ICD-10-CM | POA: Diagnosis not present

## 2022-05-14 DIAGNOSIS — S0990XA Unspecified injury of head, initial encounter: Secondary | ICD-10-CM | POA: Diagnosis present

## 2022-05-14 DIAGNOSIS — Z79899 Other long term (current) drug therapy: Secondary | ICD-10-CM | POA: Diagnosis not present

## 2022-05-14 DIAGNOSIS — I951 Orthostatic hypotension: Secondary | ICD-10-CM | POA: Diagnosis not present

## 2022-05-14 DIAGNOSIS — E871 Hypo-osmolality and hyponatremia: Secondary | ICD-10-CM | POA: Diagnosis not present

## 2022-05-14 MED ORDER — ACETAMINOPHEN 500 MG PO TABS
1000.0000 mg | ORAL_TABLET | ORAL | Status: AC
  Administered 2022-05-15: 1000 mg via ORAL
  Filled 2022-05-14: qty 2

## 2022-05-14 NOTE — ED Triage Notes (Signed)
Patient arrived with jail guard , lost her balance and fell this afternoon with mild dizziness , she also reported bilateral hip pain with headache and neck pain . She stated new prescription medication for hypertension this week .

## 2022-05-14 NOTE — ED Notes (Signed)
Pt called x1, no response.

## 2022-05-15 ENCOUNTER — Emergency Department (HOSPITAL_COMMUNITY)

## 2022-05-15 LAB — COMPREHENSIVE METABOLIC PANEL
ALT: 45 U/L — ABNORMAL HIGH (ref 0–44)
AST: 43 U/L — ABNORMAL HIGH (ref 15–41)
Albumin: 4.2 g/dL (ref 3.5–5.0)
Alkaline Phosphatase: 104 U/L (ref 38–126)
Anion gap: 13 (ref 5–15)
BUN: 40 mg/dL — ABNORMAL HIGH (ref 6–20)
CO2: 25 mmol/L (ref 22–32)
Calcium: 9.6 mg/dL (ref 8.9–10.3)
Chloride: 95 mmol/L — ABNORMAL LOW (ref 98–111)
Creatinine, Ser: 1.72 mg/dL — ABNORMAL HIGH (ref 0.44–1.00)
GFR, Estimated: 35 mL/min — ABNORMAL LOW (ref >60)
Glucose, Bld: 129 mg/dL — ABNORMAL HIGH (ref 70–99)
Potassium: 3.6 mmol/L (ref 3.5–5.1)
Sodium: 133 mmol/L — ABNORMAL LOW (ref 135–145)
Total Bilirubin: 0.9 mg/dL (ref 0.3–1.2)
Total Protein: 7.6 g/dL (ref 6.5–8.1)

## 2022-05-15 LAB — CBC
HCT: 40.7 % (ref 36.0–46.0)
Hemoglobin: 13.7 g/dL (ref 12.0–15.0)
MCH: 27.9 pg (ref 26.0–34.0)
MCHC: 33.7 g/dL (ref 30.0–36.0)
MCV: 82.9 fL (ref 80.0–100.0)
Platelets: 226 10*3/uL (ref 150–400)
RBC: 4.91 MIL/uL (ref 3.87–5.11)
RDW: 14.3 % (ref 11.5–15.5)
WBC: 6.3 10*3/uL (ref 4.0–10.5)
nRBC: 0 % (ref 0.0–0.2)

## 2022-05-15 MED ORDER — ONDANSETRON HCL 4 MG PO TABS: 4.0000 mg | ORAL_TABLET | Freq: Three times a day (TID) | ORAL | 0 refills | Status: AC | PRN

## 2022-05-15 MED ORDER — ONDANSETRON HCL 4 MG/2ML IJ SOLN
4.0000 mg | Freq: Once | INTRAMUSCULAR | Status: AC
  Administered 2022-05-15: 4 mg via INTRAVENOUS
  Filled 2022-05-15: qty 2

## 2022-05-15 MED ORDER — LACTATED RINGERS IV BOLUS
500.0000 mL | Freq: Once | INTRAVENOUS | Status: AC
  Administered 2022-05-15: 500 mL via INTRAVENOUS

## 2022-05-15 MED ORDER — ONDANSETRON 4 MG PO TBDP: 4.0000 mg | ORAL_TABLET | Freq: Once | ORAL | Status: DC

## 2022-05-15 NOTE — ED Notes (Signed)
Discharge instructions reviewed with patient and Curator. Patient denies any questions or concerns at this time. Patient transported out of ED in law enforcement custody.

## 2022-05-15 NOTE — Discharge Instructions (Addendum)
Thank you for letting us take care of you today.  Your workup showed a mild orthostatic hypotension and signs of dehydration. This in addition to your recent medication change likely contributed to your symptoms yesterday. I recommend you stop the blood pressure medications that were recently changed. Your blood pressure should be checked daily. I recommend not restarting these medications unless blood pressure is greater than 140/90 and that you should be seen by facility provider within the next week to discuss if you should be on these or other anti-hypertensives.   It is very important to stay well-hydrated especially while sick with the nausea and diarrhea you described. I prescribed you a medication to help with nausea.   I understand you will be discharged from current facility soon. I have provided the names of 2 primary care clinics you  may contact to arrange an appointment after you are home. You may choose either or a PCP of your own choosing. I also recommend you continue to regularly have your kidney function monitored as well as the lung nodule that we discussed.  If you develop abdominal pain, inability to eat or drink, uncontrollable vomiting or diarrhea, chest pain, shortness of breath, or other concerns, please be re-evaluated at nearest emergency department.

## 2022-05-15 NOTE — ED Provider Notes (Signed)
[place] EMERGENCY DEPARTMENT AT [facility] Provider Note   CSN: [id number] Arrival date & time: 05/14/22  2104     History  Chief Complaint  Patient presents with   Marletta Lor    [name] is a 57 y.o. female with past medical history hypertension, hyperlipidemia, opiate abuse who presents to the ED complaining of an episode where she got lightheaded and dizzy and fell while at her detention facility yesterday.  Patient states that she was started on a new antihypertensive regimen yesterday which includes labetalol 200 mg and clonidine 0.2 mg and she believes this is what caused her fall.  She states that previously she was on lisinopril but they did not restart her normal home regimen of antihypertensives.  Blood pressure has been on the lower end of normal during her ED visit thus far while patient was waiting in the waiting room.  Readings included initial BP of 100/83 with a repeat this a.m. at 110/89.  Patient states that she is feeling much better on my initial interview and assessment.  She states that she does have some nausea and has had this since yesterday with nonbloody diarrhea.  She denies fever, chills, vomiting, abdominal pain, chest pain, shortness of breath, congestion, cough, lower extremity edema, or any other current complaints.  Patient reports that she snorts opiates but denies any other illicit drug use or alcohol use.      Home Medications Prior to Admission medications   Medication Sig Start Date End Date Taking? Authorizing Provider  ondansetron (ZOFRAN) 4 MG tablet Take 1 tablet (4 mg total) by mouth every 8 (eight) hours as needed for up to 3 days for nausea or vomiting. 05/15/22 05/18/22 Yes Siyah Mault L, PA-C  acetaminophen (TYLENOL) 650 MG CR tablet Take 650 mg by mouth every 6 (six) hours as needed for pain.    [provider]  alum & mag hydroxide-simeth (MAALOX/MYLANTA) 200-200-20 MG/5ML suspension Take 30 mLs by mouth every 4 (four)  hours as needed for indigestion. Patient not taking: Reported on 10/22/2021    [provider]  aspirin 81 MG chewable tablet Chew 81 mg by mouth daily.    [provider]  Cyanocobalamin (VITAMIN B-12 PO) Take 1 tablet by mouth daily.    [provider]  cyclobenzaprine (FLEXERIL) 10 MG tablet Take 1 tablet (10 mg total) by mouth 2 (two) times daily as needed for muscle spasms. 10/25/21   Carroll Sage, PA-C  dicyclomine (BENTYL) 20 MG tablet Take 20 mg by mouth every 6 (six) hours as needed for spasms.    [provider]  Ferrous Sulfate (IRON PO) Take 1 tablet by mouth daily.    [provider]  hydrOXYzine (ATARAX) 25 MG tablet Take 25 mg by mouth every 6 (six) hours as needed for anxiety.    [provider]  loperamide (IMODIUM) 2 MG capsule Take 2-4 mg by mouth as needed for diarrhea or loose stools.    [provider]  Magnesium Hydroxide (MILK OF MAGNESIA PO) Take 30 mLs by mouth daily as needed (constipation).    [provider]  meclizine (ANTIVERT) 25 MG tablet Take 1 tablet (25 mg total) by mouth 3 (three) times daily as needed for dizziness. 10/25/21   Carroll Sage, PA-C  methocarbamol (ROBAXIN) 500 MG tablet Take 500 mg by mouth every 8 (eight) hours as needed for muscle spasms.    [provider]  Multiple Vitamin (MULTIVITAMIN WITH MINERALS) TABS tablet  Take 1 tablet by mouth daily.    [provider]  naloxone Jonelle [employer]) nasal spray 4 mg/0.1 mL As needed for overdose 12/16/21   Lorre Nick, MD  Naproxen Sod-diphenhydrAMINE (ALEVE PM) 220-25 MG TABS Take 2 tablets by mouth at bedtime as needed (For sleep or pain).    [provider]  nicotine (NICODERM CQ - DOSED IN MG/24 HOURS) 14 mg/24hr patch Place 1 patch (14 mg total) onto the skin daily. 10/22/21   Burnadette Pop, MD  Probiotic Product (PROBIOTIC PO) Take 1 tablet by mouth daily.    [provider]  thiamine 100  MG tablet Take 1 tablet (100 mg total) by mouth daily. 10/22/21   Burnadette Pop, MD      Allergies    Ibuprofen, Benadryl [diphenhydramine], and Sulfa antibiotics    Review of Systems   Review of Systems  Constitutional:  Negative for activity change, appetite change, chills and fever.  HENT:  Negative for congestion, ear pain and sore throat.   Eyes:  Negative for pain and visual disturbance.  Respiratory:  Negative for cough, chest tightness, shortness of breath and wheezing.   Cardiovascular:  Negative for chest pain, palpitations and leg swelling.  Gastrointestinal:  Positive for diarrhea and nausea. Negative for abdominal distention, abdominal pain, anal bleeding, blood in stool, constipation, rectal pain and vomiting.  Genitourinary:  Negative for difficulty urinating, dysuria and hematuria.  Musculoskeletal:  Negative for arthralgias, back pain, gait problem, neck pain and neck stiffness.  Skin:  Negative for color change and rash.  Neurological:  Positive for dizziness and light-headedness. Negative for seizures, syncope and weakness.  Psychiatric/Behavioral:  Negative for confusion.   All other systems reviewed and are negative.   Physical Exam Updated Vital Signs BP (!) 143/80 (BP Location: Right Arm)   Pulse 76   Temp 98.5 F (36.9 C) (Oral)   Resp (!) 22   SpO2 100%  Physical Exam Vitals and nursing note reviewed.  Constitutional:      General: She is not in acute distress.    Appearance: Normal appearance. She is not toxic-appearing.  HENT:     Head: Normocephalic.     Comments: Right frontal hematoma, no abrasion, laceration, or other open wound    Nose: Nose normal.     Mouth/Throat:     Mouth: Mucous membranes are dry.  Eyes:     General: No scleral icterus.    Extraocular Movements: Extraocular movements intact.     Conjunctiva/sclera: Conjunctivae normal.  Cardiovascular:     Rate and Rhythm: Normal rate and regular rhythm.     Heart sounds: Normal  heart sounds. No murmur heard. Pulmonary:     Effort: Pulmonary effort is normal. No respiratory distress.     Breath sounds: Normal breath sounds. No wheezing, rhonchi or rales.  Abdominal:     General: Abdomen is flat. There is no distension.     Palpations: Abdomen is soft.     Tenderness: There is no abdominal tenderness. There is no right CVA tenderness, left CVA tenderness, guarding or rebound.  Musculoskeletal:        General: Normal range of motion.     Cervical back: Normal range of motion and neck supple. No rigidity or tenderness.     Right lower leg: No edema.     Left lower leg: No edema.     Comments: No bilateral calf tenderness  Skin:    General: Skin is warm and dry.  Capillary Refill: Capillary refill takes 2 to 3 seconds.     Coloration: Skin is not jaundiced or pale.  Neurological:     General: No focal deficit present.     Mental Status: She is alert and oriented to person, place, and time.     Cranial Nerves: No cranial nerve deficit.     Motor: No weakness.     Gait: Gait normal.  Psychiatric:        Mood and Affect: Mood normal.        Behavior: Behavior normal.     ED Results / Procedures / Treatments   Labs (all labs ordered are listed, but only abnormal results are displayed) Labs Reviewed  COMPREHENSIVE METABOLIC PANEL - Abnormal; Notable for the following components:      Result Value   Sodium 133 (*)    Chloride 95 (*)    Glucose, Bld 129 (*)    BUN 40 (*)    Creatinine, Ser 1.72 (*)    AST 43 (*)    ALT 45 (*)    GFR, Estimated 35 (*)    All other components within normal limits  CBC    EKG Normal sinus rhythm at 80bpm, no STEMI  Radiology CT HEAD WO CONTRAST ( )  Result Date: 05/15/2022 CLINICAL DATA:  Fall with dizziness.  Head and neck trauma. EXAM: CT HEAD WITHOUT CONTRAST CT CERVICAL SPINE WITHOUT CONTRAST TECHNIQUE: Multidetector CT imaging of the head and cervical spine was performed following the standard protocol  without intravenous contrast. Multiplanar CT image reconstructions of the cervical spine were also generated. RADIATION DOSE REDUCTION: This exam was performed according to the departmental dose-optimization program which includes automated exposure control, adjustment of the mA and/or kV according to patient size and/or use of iterative reconstruction technique. COMPARISON:  10/25/2021. FINDINGS: CT HEAD FINDINGS Brain: No acute intracranial hemorrhage, midline shift or mass effect. No extra-axial fluid collection. Periventricular white matter hypodensities are noted bilaterally. No hydrocephalus. Vascular: No hyperdense vessel or unexpected calcification. Skull: Normal. Negative for fracture or focal lesion. Sinuses/Orbits: No acute finding. Other: Large scalp hematoma over the frontal bone on the right. CT CERVICAL SPINE FINDINGS Alignment: Normal. Skull base and vertebrae: No acute fracture. No primary bone lesion or focal pathologic process. Soft tissues and spinal canal: No prevertebral fluid or swelling. No visible canal hematoma. Disc levels: Intervertebral disc space narrowing and degenerative endplate changes at C5-C6 and C6-C7. Mild facet arthropathy bilaterally. Upper chest: 3 mm left upper lobe pulmonary nodule, axial image 85. Other: No acute abnormality. IMPRESSION: 1. No acute intracranial process. 2. Large scalp hematoma over the frontal bone on the right. 3. Degenerative changes in the cervical spine without evidence of acute fracture. 4. 3 mm left upper lobe pulmonary nodule. No follow-up needed if patient is low-risk.This recommendation follows the consensus statement: Guidelines for Management of Incidental Pulmonary Nodules Detected on CT Images: From the Fleischner Society 2017; Radiology 2017; 284:228-243. Electronically Signed   By: Thornell Sartorius M.D.   On: 05/15/2022 00:23   CT Cervical Spine Wo Contrast  Result Date: 05/15/2022 CLINICAL DATA:  Fall with dizziness.  Head and neck  trauma. EXAM: CT HEAD WITHOUT CONTRAST CT CERVICAL SPINE WITHOUT CONTRAST TECHNIQUE: Multidetector CT imaging of the head and cervical spine was performed following the standard protocol without intravenous contrast. Multiplanar CT image reconstructions of the cervical spine were also generated. RADIATION DOSE REDUCTION: This exam was performed according to the departmental dose-optimization program which includes automated exposure control,  adjustment of the mA and/or kV according to patient size and/or use of iterative reconstruction technique. COMPARISON:  10/25/2021. FINDINGS: CT HEAD FINDINGS Brain: No acute intracranial hemorrhage, midline shift or mass effect. No extra-axial fluid collection. Periventricular white matter hypodensities are noted bilaterally. No hydrocephalus. Vascular: No hyperdense vessel or unexpected calcification. Skull: Normal. Negative for fracture or focal lesion. Sinuses/Orbits: No acute finding. Other: Large scalp hematoma over the frontal bone on the right. CT CERVICAL SPINE FINDINGS Alignment: Normal. Skull base and vertebrae: No acute fracture. No primary bone lesion or focal pathologic process. Soft tissues and spinal canal: No prevertebral fluid or swelling. No visible canal hematoma. Disc levels: Intervertebral disc space narrowing and degenerative endplate changes at E3-P2 and C6-C7. Mild facet arthropathy bilaterally. Upper chest: 3 mm left upper lobe pulmonary nodule, axial image 85. Other: No acute abnormality. IMPRESSION: 1. No acute intracranial process. 2. Large scalp hematoma over the frontal bone on the right. 3. Degenerative changes in the cervical spine without evidence of acute fracture. 4. 3 mm left upper lobe pulmonary nodule. No follow-up needed if patient is low-risk.This recommendation follows the consensus statement: Guidelines for Management of Incidental Pulmonary Nodules Detected on CT Images: From the Fleischner Society 2017; Radiology 2017; 284:228-243.  Electronically Signed   By: Brett Fairy M.D.   On: 05/15/2022 00:23    Procedures Orthostatics  Orthostatic Lying BP- Lying: 142/99 Abnormal  Pulse- Lying: 76 Orthostatic Sitting BP- Sitting: 132/88 Pulse- Sitting: 79 Orthostatic Standing at 3 minutes BP- Standing at 3 minutes: 121/99 Abnormal  Pulse- Standing at 3 minutes: 86    Medications Ordered in ED Medications  acetaminophen (TYLENOL) tablet 1,000 mg (1,000 mg Oral Given 05/15/22 0045)  lactated ringers bolus 500 mL (0 mLs Intravenous Stopped 05/15/22 1032)  ondansetron (ZOFRAN) injection 4 mg (4 mg Intravenous Given 05/15/22 9518)    ED Course/ Medical Decision Making/ A&P                             Medical Decision Making Amount and/or Complexity of Data Reviewed Labs: ordered. Decision-making details documented in ED Course. Radiology: ordered. Decision-making details documented in ED Course. ECG/medicine tests: ordered. Decision-making details documented in ED Course.  Risk Prescription drug management.   This is a 57 year old female presenting to ED from detention facility for evaluation of near syncopal episode. She has obvious head trauma on exam but no laceration or open wound requiring repair. Differential for near syncope broad, includes but not limited to orthostatic hypotension, vasovagal response, ACS, CVA/TIA, ICH, traumatic fracture, electrolyte disturbance, dehydration, viral illness with report of recent nausea and diarrhea. Workup ordered in triage and returned on my initial assessment and shows mild hyponatremia, right frontal scalp hematoma, but otherwise no acute changes with kidney function at baseline, no acute hemorrhage on CT scan, no acute fracture on CT cervical spine, no significant electrolyte disturbance, no active chest pain and EKG unremarkable. She does report symptoms consistent with likely acute viral illness including nausea and multiple episodes of diarrhea with mild orthostasis on  vital signs so with this provided with IV hydration and anti-emetics with significant improvement. Also notes recent change to anti-hypertensive regimen which is likely exacerbating pt condition. Discussed findings with pt and guard at bedside including recommendation for pt to discontinue current anti-hypertensive regimen, have blood pressure checked daily, and only restart medications if she is consistently hypertensive. Pt also reports she will soon be released from detention  facility so provided with PCP follow up and aware of need to follow up regarding chronically elevated kidney function and pulmonary nodule finding. Pt expressed understanding of this. Strict return precautions given and pt stable for discharge.          Final Clinical Impression(s) / ED Diagnoses Final diagnoses:  Hematoma of scalp, initial encounter  Hyponatremia  Pulmonary nodule  Orthostatic hypotension    Rx / DC Orders ED Discharge Orders          Ordered    ondansetron (ZOFRAN) 4 MG tablet  Every 8 hours PRN        05/15/22 1209              Suzzette Righter, PA-C 65/03/54 [id number]    Lianne Cure, DO 12/75/17 [phone]

## 2022-05-15 NOTE — ED Notes (Signed)
Patient provided meal bag per diet order.

## 2023-08-27 ENCOUNTER — Emergency Department (HOSPITAL_COMMUNITY)
Admission: EM | Admit: 2023-08-27 | Discharge: 2023-08-27 | Disposition: A | Discharge status: Home / Self Care | Payer: MEDICAID | Attending: Student | Admitting: Student

## 2023-08-27 ENCOUNTER — Other Ambulatory Visit: Payer: Self-pay

## 2023-08-27 ENCOUNTER — Encounter (HOSPITAL_COMMUNITY): Payer: Self-pay | Admitting: Emergency Medicine

## 2023-08-27 DIAGNOSIS — F17210 Nicotine dependence, cigarettes, uncomplicated: Secondary | ICD-10-CM | POA: Insufficient documentation

## 2023-08-27 DIAGNOSIS — R531 Weakness: Secondary | ICD-10-CM | POA: Diagnosis present

## 2023-08-27 DIAGNOSIS — G249 Dystonia, unspecified: Secondary | ICD-10-CM | POA: Insufficient documentation

## 2023-08-27 DIAGNOSIS — Z7982 Long term (current) use of aspirin: Secondary | ICD-10-CM | POA: Diagnosis not present

## 2023-08-27 DIAGNOSIS — I10 Essential (primary) hypertension: Secondary | ICD-10-CM | POA: Diagnosis not present

## 2023-08-27 DIAGNOSIS — Z79899 Other long term (current) drug therapy: Secondary | ICD-10-CM | POA: Diagnosis not present

## 2023-08-27 LAB — BASIC METABOLIC PANEL WITH GFR
Anion gap: 7 (ref 5–15)
BUN: 13 mg/dL (ref 6–20)
CO2: 34 mmol/L — ABNORMAL HIGH (ref 22–32)
Calcium: 9.2 mg/dL (ref 8.9–10.3)
Chloride: 103 mmol/L (ref 98–111)
Creatinine, Ser: 1.35 mg/dL — ABNORMAL HIGH (ref 0.44–1.00)
GFR, Estimated: 46 mL/min — ABNORMAL LOW (ref >60)
Glucose, Bld: 76 mg/dL (ref 70–99)
Potassium: 3.9 mmol/L (ref 3.5–5.1)
Sodium: 144 mmol/L (ref 135–145)

## 2023-08-27 LAB — CBC
HCT: 34.5 % — ABNORMAL LOW (ref 36.0–46.0)
Hemoglobin: 10.4 g/dL — ABNORMAL LOW (ref 12.0–15.0)
MCH: 27.2 pg (ref 26.0–34.0)
MCHC: 30.1 g/dL (ref 30.0–36.0)
MCV: 90.1 fL (ref 80.0–100.0)
Platelets: 248 10*3/uL (ref 150–400)
RBC: 3.83 MIL/uL — ABNORMAL LOW (ref 3.87–5.11)
RDW: 13.7 % (ref 11.5–15.5)
WBC: 6.6 10*3/uL (ref 4.0–10.5)
nRBC: 0 % (ref 0.0–0.2)

## 2023-08-27 MED ORDER — ROPINIROLE HCL ER 2 MG PO TB24: 2.0000 mg | ORAL_TABLET | Freq: Every day | ORAL | 0 refills | Status: DC

## 2023-08-27 MED ORDER — ROPINIROLE HCL ER 2 MG PO TB24: 2.0000 mg | ORAL_TABLET | Freq: Every day | ORAL | 0 refills | Status: AC

## 2023-08-27 NOTE — ED Provider Notes (Signed)
 [place] EMERGENCY DEPARTMENT AT [facility] Provider Note  CSN: [id number] Arrival date & time: 08/27/23 1314  Chief Complaint(s) Weakness  HPI [name] is a 58 y.o. female with PMH aortic dissection managed medically, polysubstance abuse, HTN, HLD who presents Emergency Department for evaluation of upper extremity jerking and shaking.  She states that she was recently incarcerated and was started on Sinemet for restless legs approximately 2 weeks ago.  After starting this medication she has intermittent jerking of the upper extremities then will drop objects like cigarettes or her phone when texting.  She denies numbness, tingling, weakness of the upper extremities.  Denies chest pain, shortness of breath, headache, fever or other systemic symptoms.   Past Medical History Past Medical History:  Diagnosis Date   Aortic dissection (HCC)    Carpal tunnel syndrome    Hypercholesteremia    Hypertension    Patient Active Problem List   Diagnosis Date Noted   Acute encephalopathy 10/21/2021   Hypotension 10/21/2021   Polysubstance abuse (HCC) 10/21/2021   Heroin overdose, accidental or unintentional, initial encounter (HCC) 10/21/2021   Elevated troponin 10/21/2021   Acute on chronic anemia 10/21/2021   GAD (generalized anxiety disorder) 10/21/2021   Tobacco abuse 10/21/2021   Hypokalemia 10/19/2021   Hypertensive crisis 10/16/2021   Opioid withdrawal (HCC) 10/16/2021   AKI (acute kidney injury) (HCC) 10/16/2021   Hypercholesteremia    Chest pain    Dissecting aneurysm of thoracic aorta, [place]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 Authorizing Provider  acetaminophen  (TYLENOL ) 650 MG CR tablet Take 650 mg by mouth every 6 (six) hours as needed for pain.    [provider]  alum & mag  hydroxide-simeth (MAALOX/MYLANTA) 200-200-20 MG/5ML suspension Take 30 mLs by mouth every 4 (four) hours as needed for indigestion. Patient not taking: Reported on 10/22/2021    [provider]  aspirin  81 MG chewable tablet Chew 81 mg by mouth daily.    [provider]  Cyanocobalamin (VITAMIN B-12 PO) Take 1 tablet by mouth daily.    [provider]  cyclobenzaprine  (FLEXERIL ) 10 MG tablet Take 1 tablet (10 mg total) by mouth 2 (two) times daily as needed for muscle spasms. 10/25/21   Volney Grumbles, PA-C  dicyclomine  (BENTYL ) 20 MG tablet Take 20 mg by mouth every 6 (six) hours as needed for spasms.    [provider]  Ferrous Sulfate  (IRON PO) Take 1 tablet by mouth daily.    [provider]  hydrOXYzine  (ATARAX ) 25 MG tablet Take 25 mg by mouth every 6 (six) hours as needed for anxiety.    [provider]  loperamide  (IMODIUM ) 2 MG capsule Take 2-4 mg by mouth as needed for diarrhea or loose stools.    [provider]  Magnesium  Hydroxide (MILK OF MAGNESIA PO) Take 30 mLs by mouth daily as needed (constipation).    [provider]  meclizine  (ANTIVERT ) 25 MG tablet Take 1 tablet (25 mg total) by mouth 3 (three) times daily as needed for dizziness. 10/25/21   Volney Grumbles, PA-C  methocarbamol  (ROBAXIN ) 500 MG tablet Take 500 mg by mouth every 8 (eight) hours as needed for muscle spasms.    [provider]  Multiple Vitamin (MULTIVITAMIN WITH MINERALS) TABS tablet Take 1 tablet by mouth daily.    [provider]  naloxone  (NARCAN ) nasal spray 4 mg/0.1 mL As needed for overdose 12/16/21   Lind Repine, MD  Naproxen  Sod-diphenhydrAMINE  (ALEVE  PM) 220-25 MG TABS Take 2 tablets by mouth at bedtime as needed (For sleep or pain).    [provider]  nicotine  (NICODERM CQ  - DOSED IN MG/24 HOURS) 14 mg/24hr patch Place 1 patch (14 mg total) onto the skin daily. 10/22/21   Leona Rake, MD   Probiotic Product (PROBIOTIC PO) Take 1 tablet by mouth daily.    [provider]  rOPINIRole (REQUIP XL) 2 MG 24 hr tablet Take 1 tablet (2 mg total) by mouth at bedtime. 08/27/23 09/26/23  Evelette Hollern, MD  thiamine  100 MG tablet Take 1 tablet (100 mg total) by mouth daily. 10/22/21   Leona Rake, MD                                                                                                                                    Past Surgical History Past Surgical History:  Procedure Laterality Date   CARPAL TUNNEL RELEASE     CESAREAN SECTION     CHOLECYSTECTOMY     Family History Family History  Problem Relation Age of Onset   Hypertension Mother    Hypertension Father    Cancer Maternal Grandfather     Social History Social History   Tobacco Use   Smoking status: Every Day    Current packs/day: 0.30    Types: Cigarettes   Smokeless tobacco: Never  Substance Use Topics   Alcohol use: No   Drug use: Yes    Comment: Heroin use, last use 08/29/2018   Allergies Ibuprofen , Benadryl  [diphenhydramine ], and Sulfa antibiotics  Review of Systems Review of Systems  Neurological:  Positive for tremors.    Physical Exam Vital Signs  I have reviewed the triage vital signs BP 126/89 (BP Location: Right Arm)   Pulse 90   Temp 98.3 F (36.8 C) (Oral)   Resp 16   Ht 5\' 3"  (1.6 m)   Wt 73.5 kg   SpO2 99%   BMI 28.70 kg/m   Physical Exam Vitals and nursing note reviewed.  Constitutional:      General: She is not in acute distress.    Appearance: She is well-developed.  HENT:     Head: Normocephalic and atraumatic.  Eyes:     Conjunctiva/sclera: Conjunctivae normal.  Cardiovascular:     Rate and Rhythm: Normal rate and regular rhythm.     Heart sounds: No murmur heard. Pulmonary:     Effort: Pulmonary effort is normal. No respiratory distress.     Breath sounds: Normal breath sounds.  Abdominal:     Palpations: Abdomen is soft.     Tenderness: There is  no abdominal tenderness.  Musculoskeletal:        General: No swelling.     Cervical back: Neck supple.  Skin:  General: Skin is warm and dry.     Capillary Refill: Capillary refill takes less than 2 seconds.  Neurological:     Mental Status: She is alert.     Cranial Nerves: No cranial nerve deficit.     Sensory: No sensory deficit.     Motor: No weakness.  Psychiatric:        Mood and Affect: Mood normal.     ED Results and Treatments Labs (all labs ordered are listed, but only abnormal results are displayed) Labs Reviewed  CBC - Abnormal; Notable for the following components:      Result Value   RBC 3.83 (*)    Hemoglobin 10.4 (*)    HCT 34.5 (*)    All other components within normal limits  BASIC METABOLIC PANEL WITH GFR - Abnormal; Notable for the following components:   CO2 34 (*)    Creatinine, Ser 1.35 (*)    GFR, Estimated 46 (*)    All other components within normal limits                                                                                                                          Radiology No results found.  Pertinent labs & imaging results that were available during my care of the patient were reviewed by me and considered in my medical decision making (see MDM for details).  Medications Ordered in ED Medications - No data to display                                                                                                                                   Procedures Procedures  (including critical care time)  Medical Decision Making / ED Course   This patient presents to the ED for concern of upper extremity jerking, this involves an extensive number of treatment options, and is a complaint that carries with it a high risk of complications and morbidity.  The differential diagnosis includes medication side effect, electrolyte abnormality, uremia, radiculopathy, neuropathy seizure  MDM: Patient seen emergency room for evaluation of  abnormal upper extremity movements.  Physical exam largely unremarkable outside of some intermittent tensing of the muscles in the upper extremity.  No appreciable weakness on my exam today.  Laboratory evaluation with a hemoglobin of 10.4, creatinine 1.35 but is otherwise unremarkable.  I did speak with the neurologist on-call Dr. Lindzen who agrees  that it certainly possible to have some dyskinesia from Sinemet use but this is rare.  It is technically possible that patient could have ascending extension of her thoracic dissection that would cause neurologic deficits but patient has normal blood pressure today, equal pulses bilaterally, and no chest pain.  I did offer advanced imaging today but after shared decision making, patient would like to discontinue her Sinemet and see if the symptoms improve.  She would like to follow-up outpatient with neurology which is not unreasonable.  I did voice that we could be missing critical pathology in regards to her aortic dissection but I do agree my clinical suspicion that this is involved is lower.  Patient voiced understanding of this and would like to defer CT.  Thus, we will discontinue Sinemet, I will prescribe ropinirole for her restless legs and she will be discharged outpatient neurologic follow-up.  Strict return precautions given of which she voiced understanding.   Additional history obtained:  -External records from outside source obtained and reviewed including: Chart review including previous notes, labs, imaging, consultation notes   Lab Tests: -I ordered, reviewed, and interpreted labs.   The pertinent results include:   Labs Reviewed  CBC - Abnormal; Notable for the following components:      Result Value   RBC 3.83 (*)    Hemoglobin 10.4 (*)    HCT 34.5 (*)    All other components within normal limits  BASIC METABOLIC PANEL WITH GFR - Abnormal; Notable for the following components:   CO2 34 (*)    Creatinine, Ser 1.35 (*)    GFR,  Estimated 46 (*)    All other components within normal limits      EKG   EKG Interpretation Date/Time:  Saturday Aug 27 2023 13:28:31 EDT Ventricular Rate:  85 PR Interval:  158 QRS Duration:  80 QT Interval:  390 QTC Calculation: 464 R Axis:   116  Text Interpretation: Normal sinus rhythm When compared with ECG of 15-May-2022 08:04, PREVIOUS ECG IS PRESENT Confirmed by Vadie Principato ([id number]) on 08/27/2023 8:13:49 PM          Medicines ordered and prescription drug management: Meds ordered this encounter  Medications   DISCONTD: rOPINIRole (REQUIP XL) 2 MG 24 hr tablet    Sig: Take 1 tablet (2 mg total) by mouth at bedtime.    Dispense:  30 tablet    Refill:  0   rOPINIRole (REQUIP XL) 2 MG 24 hr tablet    Sig: Take 1 tablet (2 mg total) by mouth at bedtime.    Dispense:  30 tablet    Refill:  0    -I have reviewed the patients home medicines and have made adjustments as needed  Critical interventions none  Consultations Obtained: I requested consultation with the neurologist on-call Dr. Lindzen,  and discussed lab and imaging findings as well as pertinent plan - they recommend: Outpatient follow-up, discontinue Sinemet   Cardiac Monitoring: The patient was maintained on a cardiac monitor.  I personally viewed and interpreted the cardiac monitored which showed an underlying rhythm of: NSR  Social Determinants of Health:  Factors impacting patients care include: none   Reevaluation: After the interventions noted above, I reevaluated the patient and found that they have :stayed the same  Co morbidities that complicate the patient evaluation  Past Medical History:  Diagnosis Date   Aortic dissection (HCC)    Carpal tunnel syndrome    Hypercholesteremia    Hypertension  Dispostion: I considered admission for this patient, but at this time she does not meet inpatient criteria for admission and will be discharged outpatient follow-up     Final  Clinical Impression(s) / ED Diagnoses Final diagnoses:  Dyskinesia     @PCDICTATION @    Karlyn Overman, MD 08/27/23 2014

## 2023-08-27 NOTE — ED Triage Notes (Signed)
 Pt reports jerking in her hands and upper extremities. Noted to be generalized weakness. No neuro deficits noted. VSS.

## 2023-08-27 NOTE — ED Provider Triage Note (Signed)
 Emergency Medicine Provider Triage Evaluation Note  [name] , a 58 y.o. female  was evaluated in triage.  Pt complains of "jerking" in bilateral upper extremities for the last 1 week.  She states that this occurs every day.  She is unsure how often this occurs.  She denies a history of tremors.  She denies chest pain, shortness of breath, one-sided weakness or numbness.  She denies any word finding difficulty, blurred vision, slurred speech, neck pain or headache.  She has not seen her PCP for this.  Denies nausea, vomiting or abdominal pain.  Review of Systems  Positive:  Negative:   Physical Exam  BP 126/89 (BP Location: Right Arm)   Pulse 90   Temp 98.3 F (36.8 C) (Oral)   Resp 16   Ht 5\' 3"  (1.6 m)   Wt 73.5 kg   SpO2 99%   BMI 28.70 kg/m  Gen:   Awake, no distress   Resp:  Normal effort  MSK:   Moves extremities without difficulty  Other:  Reassuring neurological examination without focal neurodeficit.  Medical Decision Making  Medically screening exam initiated at 1:23 PM.  Appropriate orders placed.  Lynasia Wisecarver was informed that the remainder of the evaluation will be completed by another provider, this initial triage assessment does not replace that evaluation, and the importance of remaining in the ED until their evaluation is complete.     Adel Aden, PA-C 08/27/23 1324

## 2023-09-27 ENCOUNTER — Emergency Department (HOSPITAL_COMMUNITY): Payer: MEDICAID

## 2023-09-27 ENCOUNTER — Encounter (HOSPITAL_COMMUNITY): Payer: Self-pay | Admitting: Emergency Medicine

## 2023-09-27 ENCOUNTER — Emergency Department (HOSPITAL_COMMUNITY)
Admission: EM | Admit: 2023-09-27 | Discharge: 2023-09-27 | Disposition: A | Discharge status: Home / Self Care | Payer: MEDICAID | Attending: Emergency Medicine | Admitting: Emergency Medicine

## 2023-09-27 ENCOUNTER — Other Ambulatory Visit: Payer: Self-pay

## 2023-09-27 DIAGNOSIS — R059 Cough, unspecified: Secondary | ICD-10-CM | POA: Diagnosis present

## 2023-09-27 DIAGNOSIS — Z7982 Long term (current) use of aspirin: Secondary | ICD-10-CM | POA: Diagnosis not present

## 2023-09-27 DIAGNOSIS — B349 Viral infection, unspecified: Secondary | ICD-10-CM | POA: Insufficient documentation

## 2023-09-27 LAB — BASIC METABOLIC PANEL WITH GFR
Anion gap: 10 (ref 5–15)
BUN: 11 mg/dL (ref 6–20)
CO2: 26 mmol/L (ref 22–32)
Calcium: 9.3 mg/dL (ref 8.9–10.3)
Chloride: 103 mmol/L (ref 98–111)
Creatinine, Ser: 1.24 mg/dL — ABNORMAL HIGH (ref 0.44–1.00)
GFR, Estimated: 51 mL/min — ABNORMAL LOW (ref >60)
Glucose, Bld: 116 mg/dL — ABNORMAL HIGH (ref 70–99)
Potassium: 4.5 mmol/L (ref 3.5–5.1)
Sodium: 139 mmol/L (ref 135–145)

## 2023-09-27 LAB — CBC WITH DIFFERENTIAL/PLATELET
Abs Immature Granulocytes: 0.01 10*3/uL (ref 0.00–0.07)
Basophils Absolute: 0 10*3/uL (ref 0.0–0.1)
Basophils Relative: 1 %
Eosinophils Absolute: 0 10*3/uL (ref 0.0–0.5)
Eosinophils Relative: 1 %
HCT: 33.7 % — ABNORMAL LOW (ref 36.0–46.0)
Hemoglobin: 10.5 g/dL — ABNORMAL LOW (ref 12.0–15.0)
Immature Granulocytes: 0 %
Lymphocytes Relative: 17 %
Lymphs Abs: 0.5 10*3/uL — ABNORMAL LOW (ref 0.7–4.0)
MCH: 27.1 pg (ref 26.0–34.0)
MCHC: 31.2 g/dL (ref 30.0–36.0)
MCV: 87.1 fL (ref 80.0–100.0)
Monocytes Absolute: 0.4 10*3/uL (ref 0.1–1.0)
Monocytes Relative: 14 %
Neutro Abs: 2.1 10*3/uL (ref 1.7–7.7)
Neutrophils Relative %: 67 %
Platelets: 230 10*3/uL (ref 150–400)
RBC: 3.87 MIL/uL (ref 3.87–5.11)
RDW: 14.6 % (ref 11.5–15.5)
WBC: 3 10*3/uL — ABNORMAL LOW (ref 4.0–10.5)
nRBC: 0 % (ref 0.0–0.2)

## 2023-09-27 LAB — RESP PANEL BY RT-PCR (RSV, FLU A&B, COVID)  RVPGX2
Influenza A by PCR: NEGATIVE
Influenza B by PCR: NEGATIVE
Resp Syncytial Virus by PCR: NEGATIVE
SARS Coronavirus 2 by RT PCR: NEGATIVE

## 2023-09-27 NOTE — Discharge Instructions (Signed)
 Return for any problem.  ?

## 2023-09-27 NOTE — ED Triage Notes (Signed)
 Patient states "every bone in my body hurts, my knees the worse" x 3 days.  Patient also endorses cough and wheezing.  Patient gives verbal consent for MSE.

## 2023-09-27 NOTE — ED Notes (Signed)
 Awaiting from lobby

## 2023-09-27 NOTE — ED Provider Notes (Signed)
 [place] EMERGENCY DEPARTMENT AT [facility] Provider Note   CSN: [id number] Arrival date & time: 09/27/23  1116     History  Chief Complaint  Patient presents with   Generalized Body Aches    [name] is a 58 y.o. female.  58 year old female with prior medical history as detailed below presents for evaluation.  Patient complains of diffuse achy joint pain, malaise, fatigue.  Symptoms began 1 week ago.  She denies fever.  She complains of mild rhinorrhea.  She denies shortness of breath or chest pain.  She reports some mild intermittent cough.  This is not productive.  The history is provided by the patient.       Home Medications Prior to Admission medications   Medication Sig Start Date End Date Taking? Authorizing Provider  acetaminophen  (TYLENOL ) 650 MG CR tablet Take 650 mg by mouth every 6 (six) hours as needed for pain.    [provider]  alum & mag hydroxide-simeth (MAALOX/MYLANTA) 200-200-20 MG/5ML suspension Take 30 mLs by mouth every 4 (four) hours as needed for indigestion. Patient not taking: Reported on 10/22/2021    [provider]  aspirin  81 MG chewable tablet Chew 81 mg by mouth daily.    [provider]  Cyanocobalamin (VITAMIN B-12 PO) Take 1 tablet by mouth daily.    [provider]  cyclobenzaprine  (FLEXERIL ) 10 MG tablet Take 1 tablet (10 mg total) by mouth 2 (two) times daily as needed for muscle spasms. 10/25/21   Volney Grumbles, PA-C  dicyclomine  (BENTYL ) 20 MG tablet Take 20 mg by mouth every 6 (six) hours as needed for spasms.    [provider]  Ferrous Sulfate  (IRON PO) Take 1 tablet by mouth daily.    [provider]  hydrOXYzine  (ATARAX ) 25 MG tablet Take 25 mg by mouth every 6 (six) hours as needed for anxiety.    [provider]  loperamide  (IMODIUM ) 2 MG capsule Take 2-4 mg by mouth as needed for diarrhea or loose stools.    [provider]  Magnesium   Hydroxide (MILK OF MAGNESIA PO) Take 30 mLs by mouth daily as needed (constipation).    [provider]  meclizine  (ANTIVERT ) 25 MG tablet Take 1 tablet (25 mg total) by mouth 3 (three) times daily as needed for dizziness. 10/25/21   Volney Grumbles, PA-C  methocarbamol  (ROBAXIN ) 500 MG tablet Take 500 mg by mouth every 8 (eight) hours as needed for muscle spasms.    [provider]  Multiple Vitamin (MULTIVITAMIN WITH MINERALS) TABS tablet Take 1 tablet by mouth daily.    [provider]  naloxone  (NARCAN ) nasal spray 4 mg/0.1 mL As needed for overdose 12/16/21   Lind Repine, MD  Naproxen  Sod-diphenhydrAMINE  (ALEVE  PM) 220-25 MG TABS Take 2 tablets by mouth at bedtime as needed (For sleep or pain).    [provider]  nicotine  (NICODERM CQ  - DOSED IN MG/24 HOURS) 14 mg/24hr patch Place 1 patch (14 mg total) onto the skin daily. 10/22/21   Leona Rake, MD  Probiotic Product (PROBIOTIC PO) Take 1 tablet by mouth daily.    [provider]  rOPINIRole  (REQUIP  XL) 2 MG 24 hr tablet Take 1 tablet (2 mg total) by mouth at bedtime. 08/27/23 09/26/23  Kommor, [place], MD  thiamine  100 MG tablet Take 1 tablet (100 mg total) by mouth daily. 10/22/21   Leona Rake, MD      Allergies    Ibuprofen , Benadryl  [diphenhydramine ],  and Sulfa antibiotics    Review of Systems   Review of Systems  All other systems reviewed and are negative.   Physical Exam Updated Vital Signs BP 131/84 (BP Location: Right Arm)   Pulse (!) 112   Temp 98.7 F (37.1 C)   Resp 16   Wt 78.9 kg   SpO2 99%   BMI 30.82 kg/m  Physical Exam Vitals and nursing note reviewed.  Constitutional:      General: She is not in acute distress.    Appearance: Normal appearance. She is well-developed.  HENT:     Head: Normocephalic and atraumatic.  Eyes:     Conjunctiva/sclera: Conjunctivae normal.     Pupils: Pupils are equal, round, and reactive to light.  Cardiovascular:     Rate  and Rhythm: Normal rate and regular rhythm.     Heart sounds: Normal heart sounds.  Pulmonary:     Effort: Pulmonary effort is normal. No respiratory distress.     Breath sounds: Normal breath sounds.  Abdominal:     General: There is no distension.     Palpations: Abdomen is soft.     Tenderness: There is no abdominal tenderness.  Musculoskeletal:        General: No deformity. Normal range of motion.     Cervical back: Normal range of motion and neck supple.  Skin:    General: Skin is warm and dry.  Neurological:     General: No focal deficit present.     Mental Status: She is alert and oriented to person, place, and time.     ED Results / Procedures / Treatments   Labs (all labs ordered are listed, but only abnormal results are displayed) Labs Reviewed  BASIC METABOLIC PANEL WITH GFR - Abnormal; Notable for the following components:      Result Value   Glucose, Bld 116 (*)    Creatinine, Ser 1.24 (*)    GFR, Estimated 51 (*)    All other components within normal limits  CBC WITH DIFFERENTIAL/PLATELET - Abnormal; Notable for the following components:   WBC 3.0 (*)    Hemoglobin 10.5 (*)    HCT 33.7 (*)    Lymphs Abs 0.5 (*)    All other components within normal limits  RESP PANEL BY RT-PCR (RSV, FLU A&B, COVID)  [id number]    EKG None  Radiology DG Chest 2 View Result Date: 09/27/2023 CLINICAL DATA:  Cough EXAM: CHEST - 2 VIEW COMPARISON:  11/17/2021.  CT 10/17/2021. FINDINGS: Heart is normal size. Tortuous, dilated aorta similar to prior CT. Lungs clear. No effusions or pneumothorax. No acute bony abnormality. IMPRESSION: Stable aneurysmal dilatation of the descending thoracic aorta. No acute cardiopulmonary disease. Electronically Signed   By: Janeece Mechanic M.D.   On: 09/27/2023 14:00    Procedures Procedures    Medications Ordered in ED Medications - No data to display  ED Course/ Medical Decision Making/ A&P                                 Medical Decision  Making   Medical Screen Complete  This patient presented to the ED with complaint of myalgia, body aches, malaise, fatigue.  This complaint involves an extensive number of treatment options. The initial differential diagnosis includes, but is not limited to, viral syndrome, metabolic abnormality, dehydration, viral versus bacterial infection, anemia, etc.  This presentation is: Acute, Self-Limited, Previously Undiagnosed, Uncertain  Prognosis, Complicated, and Systemic Symptoms  Patient presents with constellation of symptoms most consistent with likely mild viral syndrome.  Screening labs obtained are without significant acute abnormality.  Patient reassured after evaluation results obtained.  Patient desires discharge.  She understands need for close outpatient follow-up with her PCP.  Strict return precautions given and understood.  Additional history obtained:  External records from outside sources obtained and reviewed including prior ED visits and prior Inpatient records.   Problem List / ED Course:  Viral syndrome   Reevaluation:  After the interventions noted above, I reevaluated the patient and found that they have: improved   Disposition:  After consideration of the diagnostic results and the patients response to treatment, I feel that the patent would benefit from close outpatient followup.          Final Clinical Impression(s) / ED Diagnoses Final diagnoses:  Viral syndrome    Rx / DC Orders ED Discharge Orders     None         Burnette Carte, MD 09/27/23 1606

## 2023-09-27 NOTE — ED Notes (Signed)
 Pt ambulatory to bathroom

## 2023-09-27 NOTE — ED Provider Triage Note (Signed)
 Emergency Medicine Provider Triage Evaluation Note  [name] , a 58 y.o. female  was evaluated in triage.  Pt complains of aching all over in her joints.  She has a little bit of runny nose and congestion.  Little bit of coughing.  No fevers..  Review of Systems  Positive: Cough, congestion, bone pain Negative: Fever  Physical Exam  BP 131/84 (BP Location: Right Arm)   Pulse (!) 112   Temp 98.7 F (37.1 C)   Resp 16   Wt 78.9 kg   SpO2 99%   BMI 30.82 kg/m  Gen:   Awake, no distress    Resp:  Normal effort   MSK:   Moves extremities without difficulty   Other:     Medical Decision Making  Medically screening exam initiated at 11:46 AM.  Appropriate orders placed.  [name] was informed that the remainder of the evaluation will be completed by another provider, this initial triage assessment does not replace that evaluation, and the importance of remaining in the ED until their evaluation is complete.      Hershel Los, MD 09/27/23 1147

## 2024-01-05 ENCOUNTER — Ambulatory Visit: Admitting: Neurology

## 2024-01-30 ENCOUNTER — Other Ambulatory Visit (HOSPITAL_COMMUNITY): Payer: Self-pay

## 2024-06-05 ENCOUNTER — Ambulatory Visit: Payer: MEDICAID | Admitting: Registered Nurse

## 2024-06-05 ENCOUNTER — Ambulatory Visit: Admitting: Neurology
